# Patient Record
Sex: Female | Born: 1950 | Race: White | Hispanic: No | Marital: Married | Smoking: Former smoker
Health system: Southern US, Community
[De-identification: ages and names within clinical notes are randomized; demographics above are authoritative.]

## PROBLEM LIST (undated history)

## (undated) DIAGNOSIS — T7840XA Allergy, unspecified, initial encounter: Secondary | ICD-10-CM

## (undated) DIAGNOSIS — Z860100 Personal history of colon polyps, unspecified: Secondary | ICD-10-CM

## (undated) DIAGNOSIS — G971 Other reaction to spinal and lumbar puncture: Secondary | ICD-10-CM

## (undated) DIAGNOSIS — Z9889 Other specified postprocedural states: Secondary | ICD-10-CM

## (undated) DIAGNOSIS — I341 Nonrheumatic mitral (valve) prolapse: Secondary | ICD-10-CM

## (undated) DIAGNOSIS — N6019 Diffuse cystic mastopathy of unspecified breast: Secondary | ICD-10-CM

## (undated) DIAGNOSIS — E210 Primary hyperparathyroidism: Secondary | ICD-10-CM

## (undated) DIAGNOSIS — R112 Nausea with vomiting, unspecified: Secondary | ICD-10-CM

## (undated) DIAGNOSIS — Z780 Asymptomatic menopausal state: Secondary | ICD-10-CM

## (undated) HISTORY — PX: COLONOSCOPY: SHX174

## (undated) HISTORY — PX: TONSILLECTOMY AND ADENOIDECTOMY: SUR1326

## (undated) HISTORY — DX: Asymptomatic menopausal state: Z78.0

## (undated) HISTORY — DX: Nonrheumatic mitral (valve) prolapse: I34.1

## (undated) HISTORY — PX: HEMORRHOID SURGERY: SHX153

## (undated) HISTORY — DX: Personal history of colon polyps, unspecified: Z86.0100

## (undated) HISTORY — DX: Diffuse cystic mastopathy of unspecified breast: N60.19

## (undated) HISTORY — PX: CYSTOSTOMY W/ BLADDER BIOPSY: SHX1431

## (undated) HISTORY — PX: OTHER SURGICAL HISTORY: SHX169

## (undated) HISTORY — DX: Allergy, unspecified, initial encounter: T78.40XA

## (undated) HISTORY — DX: Personal history of colonic polyps: Z86.010

---
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
## 1989-05-19 LAB — HM PAP SMEAR: HM Pap smear: NEGATIVE

## 1995-10-11 HISTORY — PX: THUMB FUSION: SUR636

## 1998-04-27 ENCOUNTER — Other Ambulatory Visit: Admission: RE | Admit: 1998-04-27 | Discharge: 1998-04-27 | Payer: Self-pay | Admitting: Gynecology

## 1998-10-16 ENCOUNTER — Ambulatory Visit (HOSPITAL_COMMUNITY): Admission: RE | Admit: 1998-10-16 | Discharge: 1998-10-16 | Payer: Self-pay | Admitting: Urology

## 1999-02-23 ENCOUNTER — Other Ambulatory Visit: Admission: RE | Admit: 1999-02-23 | Discharge: 1999-02-23 | Payer: Self-pay | Admitting: Gynecology

## 1999-06-02 ENCOUNTER — Other Ambulatory Visit: Admission: RE | Admit: 1999-06-02 | Discharge: 1999-06-02 | Payer: Self-pay | Admitting: Gynecology

## 2000-06-22 ENCOUNTER — Ambulatory Visit (HOSPITAL_COMMUNITY): Admission: RE | Admit: 2000-06-22 | Discharge: 2000-06-22 | Payer: Self-pay | Admitting: Orthopedic Surgery

## 2000-06-22 ENCOUNTER — Encounter: Payer: Self-pay | Admitting: Orthopedic Surgery

## 2000-07-11 ENCOUNTER — Other Ambulatory Visit: Admission: RE | Admit: 2000-07-11 | Discharge: 2000-07-11 | Payer: Self-pay | Admitting: Gynecology

## 2001-07-17 ENCOUNTER — Other Ambulatory Visit: Admission: RE | Admit: 2001-07-17 | Discharge: 2001-07-17 | Payer: Self-pay | Admitting: Gynecology

## 2002-09-24 ENCOUNTER — Other Ambulatory Visit: Admission: RE | Admit: 2002-09-24 | Discharge: 2002-09-24 | Payer: Self-pay | Admitting: Gynecology

## 2003-05-28 ENCOUNTER — Ambulatory Visit (HOSPITAL_COMMUNITY): Admission: RE | Admit: 2003-05-28 | Discharge: 2003-05-28 | Payer: Self-pay | Admitting: *Deleted

## 2003-05-28 ENCOUNTER — Encounter (INDEPENDENT_AMBULATORY_CARE_PROVIDER_SITE_OTHER): Payer: Self-pay | Admitting: Specialist

## 2004-01-14 ENCOUNTER — Other Ambulatory Visit: Admission: RE | Admit: 2004-01-14 | Discharge: 2004-01-14 | Payer: Self-pay | Admitting: Gynecology

## 2005-01-24 ENCOUNTER — Other Ambulatory Visit: Admission: RE | Admit: 2005-01-24 | Discharge: 2005-01-24 | Payer: Self-pay | Admitting: Gynecology

## 2005-02-02 ENCOUNTER — Encounter: Admission: RE | Admit: 2005-02-02 | Discharge: 2005-02-02 | Payer: Self-pay | Admitting: Family Medicine

## 2006-03-28 ENCOUNTER — Ambulatory Visit: Payer: Self-pay | Admitting: Family Medicine

## 2006-04-05 ENCOUNTER — Other Ambulatory Visit: Admission: RE | Admit: 2006-04-05 | Discharge: 2006-04-05 | Payer: Self-pay | Admitting: Gynecology

## 2006-07-07 ENCOUNTER — Ambulatory Visit: Payer: Self-pay | Admitting: Family Medicine

## 2006-09-13 ENCOUNTER — Ambulatory Visit: Payer: Self-pay | Admitting: Family Medicine

## 2007-03-21 ENCOUNTER — Ambulatory Visit: Payer: Self-pay | Admitting: Family Medicine

## 2007-11-28 ENCOUNTER — Other Ambulatory Visit: Admission: RE | Admit: 2007-11-28 | Discharge: 2007-11-28 | Payer: Self-pay | Admitting: Gynecology

## 2008-02-28 ENCOUNTER — Ambulatory Visit: Payer: Self-pay | Admitting: Family Medicine

## 2008-07-23 LAB — HM COLONOSCOPY: HM Colonoscopy: NEGATIVE

## 2008-07-28 ENCOUNTER — Ambulatory Visit: Payer: Self-pay | Admitting: Family Medicine

## 2009-01-07 ENCOUNTER — Ambulatory Visit: Payer: Self-pay | Admitting: Family Medicine

## 2009-06-30 ENCOUNTER — Ambulatory Visit: Payer: Self-pay | Admitting: Family Medicine

## 2009-07-07 ENCOUNTER — Ambulatory Visit: Payer: Self-pay | Admitting: Family Medicine

## 2011-02-25 NOTE — Op Note (Signed)
[facility]  Patient:    Shannon [name], Shannon [name]                        MRN: [id number] Proc. Date: 06/22/00 Adm. Date:  [id number] Disc. Date: [id number] Attending:  Dominica Severin                           Operative Report  DATE OF BIRTH:  [birth year]  PREOPERATIVE DIAGNOSES:  Left thumb interphalangeal joint fracture with displacement of the distal phalanx dorsal portion.  POSTOPERATIVE DIAGNOSES:  Left thumb interphalangeal joint fracture with displacement of the distal phalanx dorsal portion.  PROCEDURE:  Open reduction internal fixation left thumb IP joint fracture.  SURGEON:  Dominica Severin, M.D.  ASSISTANT:  Grayland Jack, P.A.-C.  COMPLICATIONS:  None.  ANESTHESIA:  LMA, general.  TOURNIQUET TIME:  An hour.  DRAINS:  None.  INDICATIONS FOR PROCEDURE:  This patient is a 60 year old white female who has previously been seen in our office by Dr. Ollen Gross. I have counseled her in regards to the risks and benefits of surgery including risk of infection, bleeding, anesthesia, damage to normal structures and failure of the surgery to accomplish its goals of relieving symptoms and restoring function. She desires to proceed.  I have discussed with her the inarticulate nature of her fracture, etc. Given the displacement and high demands upon her thumb, we are planning an ORIF. She understands all the risks and benefits, etc.  OPERATIVE FINDINGS:  The patient had displaced interphalangeal joint fracture about the left thumb, attempted closed manipulation was unsuccessful and thus ORIF was performed without difficulty. She was stable to range of motion after two 1.2 mm screws of the titanium variety were placed dorsal to palmar. There were no complications.  DESCRIPTION OF PROCEDURE:  The patient was taken to the operative suite at which time, LMA anesthesia was induced without difficulty. Preoperative antibiotics were given and the patient  then underwent prep and drape of the left upper extremity in the usual sterile fashion consisting of a Betadine scrub followed by Betadine painting and draping. Once this was done, the patient had manipulative reduction performed for possible percutaneous fixation. This was unable to reduce the fracture adequately. Given the time frame duration, I felt she had interpose tissue in the fracture site preventing this. Thus the tourniquet was inflated to 250 mmHg and the patient had an incision somewhat S shaped made over the IP joint. The patient had dissection carried down through the skin with ______. The EPL was identified and the skin was retracted out of harms way. The fracture site was then expose, curetted, irrigated and interpose fibrous tissue removed from the fracture site which was preventing reduction. Once this was done, provisional fixation was held, a K wire was placed for provision reduction, x-rays were checked. This was noted to be in acceptable alignment and the patient then had placement of two 1.2 mm titanium screws from the Liebinger set. These screws had excellent purchase. There were complications with screw insertion. The patient was then fluorod and placed throughout a range of motion. She was noted to be stable. Fracture reduction was adequate and there was no residual step-off. I was happy with the overall fit. The wound was then copiously irrigated, tourniquet deflated, hemostasis obtained, and the wound closed with interrupted 4-0 Prolene suture. The patient had excellent refill. Marcaine block was given at the end of the  case  0.25% without epinephrine. This was done with 10 cc. Following this, a sterile dressing was applied consisting of xeroform gauze and a volar and a dorsal plaster slab with a finger splint. The patient tolerated this well. She was transferred to the recovery room after extubation and was stable in the recovery room. All sponge, needle  and instrument counts were reported as correct. There were no complications. She will be discharged on Keflex, Percocet and Phenergan to be taken as directed. She will notify me should any problems occur. She will have a regular diet and return to see me in 7 days. DD:  06/22/00 TD:  06/24/00 Job: [id number]

## 2011-02-25 NOTE — Op Note (Signed)
   NAME:  Shannon [name], Shannon [name]                           ACCOUNT NO.:  [id number]   MEDICAL RECORD NO.:  [id number]                   PATIENT TYPE:  AMB   LOCATION:  DAY                                  FACILITY:  [facility]   PHYSICIAN:  Vikki Ports, M.D.         DATE OF BIRTH:  [birth year]   DATE OF PROCEDURE:  05/28/2003  DATE OF DISCHARGE:                                 OPERATIVE REPORT   PREOPERATIVE DIAGNOSIS:  Grade 3 internal hemorrhoids.   POSTOPERATIVE DIAGNOSIS:  Grade 3 internal hemorrhoids.   PROCEDURE:  Complex hemorrhoidectomy, PPH.   ANESTHESIA:  General.   SURGEON:  Vikki Ports, M.D.   ASSISTANTSharlet Salina T. Hoxworth, M.D.   DESCRIPTION OF PROCEDURE:  The patient was taken to the operating room,  placed in a supine position.  After adequate anesthesia was induced, the  patient was placed in the prone jack-knife position.  After perianal/rectal  prep was undertaken, the submucosal tissue was injected with 0.25% Marcaine  with epinephrine and Wydase.  The internal and external sphincters were then  injected using 0.25% Marcaine.  After this was accomplished, anal dilatation  was performed digitally to three fingers.  Surg-I-Loop was then injected  into the rectum.  Anoscope was then inserted, and a 2-0 Prolene pursestring  suture was placed submucosally in a circumferential fashion approximately 4  cm proximal to the dentate line.  After this was accomplished, the Ethicon  PPH stapler was placed, and anvil was placed proximal to the pursestring.  Stapler was then closed tightly to 0.75 mm stapler depth, and digital  inspection of the vagina showed no involvement of the vaginal wall.  After  45 seconds, stapler was then fired.  It was removed, and donut was inspected  showing no muscular tissue; a full, intact donut ring.  The staple line was  then visually inspected.  There was no visible bleeding.  Gelfoam packing  was placed.  Anoderm was injected with  the 0.25% Marcaine.  Dressing was  applied.  Patient tolerated the procedure well and went to PACU in good  condition.                                               Vikki Ports, M.D.    KRH/MEDQ  D:  05/28/2003  T:  05/29/2003  Job:  [id number]

## 2011-03-18 ENCOUNTER — Telehealth: Payer: Self-pay | Admitting: Family Medicine

## 2011-03-18 NOTE — Telephone Encounter (Signed)
PT CALLED AND MADE APP. FOR Tuesday WITH DR. Susann Givens.  CM, LPN

## 2011-03-18 NOTE — Telephone Encounter (Signed)
Last visit here was 06/2009.  Needs appt.

## 2011-03-21 NOTE — Telephone Encounter (Signed)
HANDLED

## 2011-03-22 ENCOUNTER — Encounter: Payer: Self-pay | Admitting: Family Medicine

## 2011-03-22 ENCOUNTER — Ambulatory Visit (INDEPENDENT_AMBULATORY_CARE_PROVIDER_SITE_OTHER): Payer: BC Managed Care – PPO | Admitting: Family Medicine

## 2011-03-22 DIAGNOSIS — J45909 Unspecified asthma, uncomplicated: Secondary | ICD-10-CM | POA: Insufficient documentation

## 2011-03-22 DIAGNOSIS — F410 Panic disorder [episodic paroxysmal anxiety] without agoraphobia: Secondary | ICD-10-CM

## 2011-03-22 MED ORDER — ALBUTEROL 90 MCG/ACT IN AERS: 2.0000 | INHALATION_SPRAY | Freq: Four times a day (QID) | RESPIRATORY_TRACT | Status: DC | PRN

## 2011-03-22 MED ORDER — LORAZEPAM 0.5 MG PO TABS: 0.5000 mg | ORAL_TABLET | Freq: Two times a day (BID) | ORAL | Status: AC | PRN

## 2011-03-22 NOTE — Progress Notes (Signed)
  Subjective:    Patient ID: Shannon [name], female    DOB: Jul 12, 1951, 60 y.o.   MRN: [id number]  HPI She is here for consultation concerning asthma. She has a previous history of exercise-induced asthma and now mainly has trouble when she is exposed to particular irritants. She uses albuterol very rarely. She also has cut back on her Xanax use and rarely uses it. Her last prescription was October 2010. She did find however that several days after taking the Xanax she became quite irritable.   Review of Systems     Objective:   Physical Examalert and in no distress. Tympanic membranes and canals are normal. Throat is clear. Tonsils are normal. Neck is supple without adenopathy or thyromegaly. Cardiac exam shows a regular sinus rhythm without murmurs or gallops. Lungs are clear to auscultation.         Assessment & Plan:  Asthma. Panic attacks Renew albuterol and I will switch her to Ativan and see how that works

## 2011-03-22 NOTE — Patient Instructions (Signed)
Use the albuterol as needed. Also let me know how the Ativan works in with you have any side effects

## 2011-04-19 ENCOUNTER — Encounter: Payer: Self-pay | Admitting: Family Medicine

## 2011-10-06 ENCOUNTER — Encounter: Payer: Self-pay | Admitting: Family Medicine

## 2011-10-06 ENCOUNTER — Ambulatory Visit (INDEPENDENT_AMBULATORY_CARE_PROVIDER_SITE_OTHER): Payer: BC Managed Care – PPO | Admitting: Family Medicine

## 2011-10-06 VITALS — BP 112/70 | HR 98 | Wt 135.0 lb

## 2011-10-06 DIAGNOSIS — Z139 Encounter for screening, unspecified: Secondary | ICD-10-CM

## 2011-10-06 DIAGNOSIS — H6190 Disorder of external ear, unspecified, unspecified ear: Secondary | ICD-10-CM

## 2011-10-06 DIAGNOSIS — Z23 Encounter for immunization: Secondary | ICD-10-CM

## 2011-10-06 DIAGNOSIS — R768 Other specified abnormal immunological findings in serum: Secondary | ICD-10-CM

## 2011-10-06 DIAGNOSIS — R894 Abnormal immunological findings in specimens from other organs, systems and tissues: Secondary | ICD-10-CM

## 2011-10-06 NOTE — Progress Notes (Signed)
  Subjective:    Patient ID: Shannon [name], female    DOB: [birth year], 60 y.o.   MRN: [id number]  HPI She is here for consult concerning recent blood work done at [facility] which did show hepatitis C. She has a remote history of blood transfusion in 1971. She has never been involved with street drugs. She is quite distraught over this. She also would like a right earlobe reevaluated. The area is still gets scaly and slightly itchy.   Review of Systems     Objective:   Physical Exam Alert and in no distress. The right ear lobe on the superior part does show some granulation type tissue.       Assessment & Plan:   1. Screening for unspecified condition  Hepatitis B Surface AntiBODY, Hepatitis A Ab, Total  2. Hepatitis C antibody test positive  Hepatitis C Antibody, Hepatitis C RNA quantitative, Hepatitis C genotype, CBC with Differential, Comprehensive metabolic panel, Hepatitis B Surface AntiBODY, Hepatitis A Ab, Total  3. Lesion of earlobe  Ambulatory referral to ENT   I had a long discussion with her concerning hepatitis C and potential damage to her liver as well as transmission to her son. She will have him come in for blood work. I will do routine hepatitis C screening and inform her when I get the results

## 2011-10-06 NOTE — Patient Instructions (Signed)
-  I will call you with the results

## 2011-10-07 LAB — CBC WITH DIFFERENTIAL/PLATELET
Basophils Absolute: 0.1 10*3/uL (ref 0.0–0.1)
Basophils Relative: 2 % — ABNORMAL HIGH (ref 0–1)
Eosinophils Relative: 2 % (ref 0–5)
HCT: 43.9 % (ref 36.0–46.0)
Hemoglobin: 13.7 g/dL (ref 12.0–15.0)
MCHC: 31.2 g/dL (ref 30.0–36.0)
MCV: 101.2 fL — ABNORMAL HIGH (ref 78.0–100.0)
Monocytes Absolute: 0.4 10*3/uL (ref 0.1–1.0)
Monocytes Relative: 9 % (ref 3–12)
RDW: 14.1 % (ref 11.5–15.5)

## 2011-10-07 LAB — COMPREHENSIVE METABOLIC PANEL
AST: 23 U/L (ref 0–37)
Albumin: 5.1 g/dL (ref 3.5–5.2)
BUN: 16 mg/dL (ref 6–23)
Calcium: 11.9 mg/dL — ABNORMAL HIGH (ref 8.4–10.5)
Chloride: 105 mEq/L (ref 96–112)
Creat: 0.69 mg/dL (ref 0.50–1.10)
Glucose, Bld: 116 mg/dL — ABNORMAL HIGH (ref 70–99)
Potassium: 5.4 mEq/L — ABNORMAL HIGH (ref 3.5–5.3)

## 2011-10-07 LAB — HEPATITIS C ANTIBODY: HCV Ab: REACTIVE — AB

## 2012-05-17 LAB — HM DEXA SCAN

## 2012-05-17 LAB — HM PAP SMEAR: HM Pap smear: NEGATIVE

## 2012-05-29 ENCOUNTER — Encounter: Payer: Self-pay | Admitting: Internal Medicine

## 2012-06-04 ENCOUNTER — Encounter: Payer: Self-pay | Admitting: Family Medicine

## 2012-06-04 ENCOUNTER — Ambulatory Visit (INDEPENDENT_AMBULATORY_CARE_PROVIDER_SITE_OTHER): Payer: BC Managed Care – PPO | Admitting: Family Medicine

## 2012-06-04 VITALS — BP 110/70 | HR 80 | Ht 64.5 in | Wt 126.0 lb

## 2012-06-04 DIAGNOSIS — M810 Age-related osteoporosis without current pathological fracture: Secondary | ICD-10-CM

## 2012-06-04 DIAGNOSIS — Z2911 Encounter for prophylactic immunotherapy for respiratory syncytial virus (RSV): Secondary | ICD-10-CM

## 2012-06-04 DIAGNOSIS — Z8249 Family history of ischemic heart disease and other diseases of the circulatory system: Secondary | ICD-10-CM

## 2012-06-04 DIAGNOSIS — F410 Panic disorder [episodic paroxysmal anxiety] without agoraphobia: Secondary | ICD-10-CM

## 2012-06-04 DIAGNOSIS — M722 Plantar fascial fibromatosis: Secondary | ICD-10-CM

## 2012-06-04 DIAGNOSIS — Z0000 Encounter for general adult medical examination without abnormal findings: Secondary | ICD-10-CM

## 2012-06-04 LAB — COMPREHENSIVE METABOLIC PANEL
AST: 18 U/L (ref 0–37)
Albumin: 4.4 g/dL (ref 3.5–5.2)
Alkaline Phosphatase: 86 U/L (ref 39–117)
BUN: 8 mg/dL (ref 6–23)
Calcium: 11.2 mg/dL — ABNORMAL HIGH (ref 8.4–10.5)
Chloride: 104 mEq/L (ref 96–112)
Potassium: 4.6 mEq/L (ref 3.5–5.3)
Sodium: 137 mEq/L (ref 135–145)
Total Protein: 6.8 g/dL (ref 6.0–8.3)

## 2012-06-04 LAB — LIPID PANEL
HDL: 65 mg/dL (ref >39)
LDL Cholesterol: 120 mg/dL — ABNORMAL HIGH (ref 0–99)

## 2012-06-04 LAB — CBC WITH DIFFERENTIAL/PLATELET
Basophils Absolute: 0.1 10*3/uL (ref 0.0–0.1)
HCT: 39.3 % (ref 36.0–46.0)
Hemoglobin: 13.3 g/dL (ref 12.0–15.0)
Lymphocytes Relative: 47 % — ABNORMAL HIGH (ref 12–46)
Monocytes Absolute: 0.3 10*3/uL (ref 0.1–1.0)
Monocytes Relative: 6 % (ref 3–12)
Neutro Abs: 2 10*3/uL (ref 1.7–7.7)
Neutrophils Relative %: 43 % (ref 43–77)
RDW: 13.3 % (ref 11.5–15.5)
WBC: 4.6 10*3/uL (ref 4.0–10.5)

## 2012-06-04 MED ORDER — ALENDRONATE SODIUM 70 MG PO TABS: 70.0000 mg | ORAL_TABLET | ORAL | Status: DC

## 2012-06-04 NOTE — Progress Notes (Signed)
Subjective:    Patient ID: Shannon [name], female    DOB: 03-13-1951, 61 y.o.   MRN: [id number]  HPI She is here for complete examination. She is up-to-date on her Pap and mammogram. Colonoscopy was in 2009. She had a recent DEXA scan which did show osteoporosis. She also has been having left heel pain for the last 6 months. She has been using heel cups and states that she is much better. She does continue on her multivitamin. She does have a history of panic attacks especially driving. She has a remote history of asthma. In June she was seen in an urgent care Center and treated for otitis media with Amoxil. Apparently did help her symptoms. She mainly complains of right parieto-occipital discomfort and ear fullness. Family history significant for several family members that died suddenly and the presumed diagnosis was heart related. Review of Systems Negative except as above    Objective:   Physical Exam BP 110/70  Pulse 80  Ht 5' 4.5" (1.638 m)  Wt 126 lb (57.153 kg)  BMI 21.29 kg/m2  SpO2 97%  General Appearance:    Alert, cooperative, no distress, appears stated age  Head:    Normocephalic, without obvious abnormality, atraumatic  Eyes:    PERRL, conjunctiva/corneas clear, EOM's intact, fundi    benign  Ears:    Normal TM's and external ear canals  Nose:   Nares normal, mucosa normal, no drainage or sinus   tenderness  Throat:   Lips, mucosa, and tongue normal; teeth and gums normal  Neck:   Supple, no lymphadenopathy;  thyroid:  no   enlargement/tenderness/nodules; no carotid   bruit or JVD  Back:    Spine nontender, no curvature, ROM normal, no CVA     tenderness  Lungs:     Clear to auscultation bilaterally without wheezes, rales or     ronchi; respirations unlabored  Chest Wall:    No tenderness or deformity   Heart:    Regular rate and rhythm, S1 and S2 normal, no murmur, rub   or gallop  Breast Exam:    Deferred to GYN  Abdomen:     Soft, non-tender, nondistended,  normoactive bowel sounds,    no masses, no hepatosplenomegaly  Genitalia:    Deferred to GYN     Extremities:   No clubbing, cyanosis or edema, tender to palpation over the left calcaneal spur. Achilles tendon normal. Normal foot and ankle motion.   Pulses:   2+ and symmetric all extremities  Skin:   Skin color, texture, turgor normal, no rashes or lesions  Lymph nodes:   Cervical, supraclavicular, and axillary nodes normal  Neurologic:   CNII-XII intact, normal strength, sensation and gait; reflexes 2+ and symmetric throughout          Psych:   Normal mood, affect, hygiene and grooming.           Assessment & Plan:   1. Osteoporosis  CBC with Differential, Comprehensive metabolic panel, Vitamin D 25 hydroxy, alendronate (FOSAMAX) 70 MG tablet  2. Plantar fasciitis of left foot    3. Family history of heart disease in female family member before age 70  CBC with Differential, Comprehensive metabolic panel, Lipid panel  4. Panic attacks    5. Routine general medical examination at a health care facility  Varicella-zoster vaccine subcutaneous, Lipid panel   instructions given on proper treatment of the plantar fasciitis with stretching and heel cups. Also discussed other modalities. She will continue  to treat her panic attacks on an as-needed basis. Discussed treatment with Fosamax including possible side effects and risk versus benefit.

## 2012-06-05 ENCOUNTER — Encounter: Payer: Self-pay | Admitting: Family Medicine

## 2012-06-05 LAB — VITAMIN D 25 HYDROXY (VIT D DEFICIENCY, FRACTURES): Vit D, 25-Hydroxy: 31 ng/mL (ref 30–89)

## 2012-06-14 ENCOUNTER — Telehealth: Payer: Self-pay | Admitting: Family Medicine

## 2012-06-14 NOTE — Telephone Encounter (Signed)
Pt called and stated that she is afraid to take fosamax. She hears such bad things that she has not had it filled and is not planning on having it filled. She states that she would rather be on estrogen. Please call pt. Pt uses [facility] at [place]

## 2013-01-16 ENCOUNTER — Ambulatory Visit (INDEPENDENT_AMBULATORY_CARE_PROVIDER_SITE_OTHER): Payer: BC Managed Care – PPO | Admitting: Medical

## 2013-01-16 ENCOUNTER — Encounter: Payer: Self-pay | Admitting: Medical

## 2013-01-16 VITALS — BP 130/80 | HR 76 | Temp 98.3°F | Resp 16

## 2013-01-16 DIAGNOSIS — IMO0002 Reserved for concepts with insufficient information to code with codable children: Secondary | ICD-10-CM

## 2013-01-16 DIAGNOSIS — S8000XA Contusion of unspecified knee, initial encounter: Secondary | ICD-10-CM

## 2013-01-16 DIAGNOSIS — S8002XA Contusion of left knee, initial encounter: Secondary | ICD-10-CM

## 2013-01-16 DIAGNOSIS — S8392XA Sprain of unspecified site of left knee, initial encounter: Secondary | ICD-10-CM

## 2013-01-16 MED ORDER — TRAMADOL HCL 50 MG PO TABS: 50.0000 mg | ORAL_TABLET | Freq: Three times a day (TID) | ORAL | Status: DC | PRN

## 2013-01-16 NOTE — Patient Instructions (Signed)
RICE  Rest Ice Compression with knee sleeve or straight leg brace Elevation of leg   Stay off the leg and rest for 5-7 days.  Use Aleve once to twice daily OTC  Tylenol or ultram for pain  If not much improved in 1-2 wk, then recheck.    Recheck 12-14 days.

## 2013-01-16 NOTE — Progress Notes (Signed)
Subjective:  Shannon [name] is a 62 y.o. female who presents for left knee pain.  Here with husband who brought her here.   Was out playing with her 70lb lab dog last evening when he ran towards her and accidentally ran into the front and lateral side of her left knee.  The impact knocked her down on her right side.  Her only c/o is left knee pain, unable to bear weight due to the pain.  No head injury, LOC.  No weakness, numbness, tingling, just pain.  Trying not to put weight on it.  Last night used Tylenol as she doesn't tolerate higher doses of NSAIDs well due to GI upset, used ice.   No other aggravating or relieving factors.    No other c/o.  The following portions of the patient's history were reviewed and updated as appropriate: allergies, current medications, past family history, past medical history, past social history, past surgical history and problem list.  ROS Otherwise as in subjective above  Objective: Physical Exam  Vital signs reviewed  General appearance: alert, no distress, WD/WN MSK: tender with left knee anteriorly and superiorly and lateral just above anterolateral joint line mainly in a flexed position of the knee, otherwise nontender to palpation, pain with weight bearing and lifting her leg extended at the knee.  No obvious swelling, deformity, or laxity of the knee, negative McMurray, otherwise bilat LE unremarkable Lower extremities neurovascularly intact Ext: no obvious swelling of left knee or leg   Assessment: Encounter Diagnoses  Name Primary?  . Knee contusion, left, initial encounter Yes  . Knee sprain and strain, left, initial encounter     Plan: Advised that joint stability is fine, no obvious swelling, seems low risk for fracture or ligamentous tear.   advised of the diagnosis of left knee contusion and sprain, discussed treatment plan, including initial rest, ice, gentle ROM, can use crutches and long knee brace (scripts given), and recheck 1-2 wk,  sooner prn.  Patient Instructions  RICE  Rest Ice Compression with knee sleeve or straight leg brace Elevation of leg   Stay off the leg and rest for 5-7 days.  Use Aleve once to twice daily OTC  Tylenol or ultram for pain  If not much improved in 1-2 wk, then recheck.    Recheck 12-14 days.

## 2013-01-23 ENCOUNTER — Ambulatory Visit: Payer: BC Managed Care – PPO | Admitting: Family Medicine

## 2014-09-26 ENCOUNTER — Ambulatory Visit (INDEPENDENT_AMBULATORY_CARE_PROVIDER_SITE_OTHER): Payer: BC Managed Care – PPO | Admitting: Family Medicine

## 2014-09-26 VITALS — BP 140/90 | HR 82 | Wt 122.0 lb

## 2014-09-26 DIAGNOSIS — M79675 Pain in left toe(s): Secondary | ICD-10-CM

## 2014-09-26 NOTE — Progress Notes (Signed)
   Subjective:    Patient ID: Shannon [name], female    DOB: 1950-11-18, 63 y.o.   MRN: [id number]  HPI She woke up this morning and while walking kicked the door jam causing lateral deviation of the left fifth toe. She was able to put it back into place.   Review of Systems     Objective:   Physical Exam swollen ecchymotic distal left fifth toe at the MTP joint. Pain on palpation in that area. Normal toe alignment X-ray shows no evidence of fracture     Assessment & Plan:  Pain of toe of left foot  she will use ice, hard soled shoes and NSAID of choice. I explained that she probably did soft tissue ligamentous damage however since she has good alignment and its the fifth toe, no intervention is really needed other than supportive care mentioned  above

## 2014-09-26 NOTE — Patient Instructions (Signed)
icefor 20 minutes 3 times per day wear hard soled shoes Advil or Aleve for pain

## 2015-01-26 ENCOUNTER — Telehealth: Payer: Self-pay | Admitting: Internal Medicine

## 2015-01-26 ENCOUNTER — Other Ambulatory Visit: Payer: Self-pay | Admitting: Medical

## 2015-01-26 ENCOUNTER — Encounter: Payer: Self-pay | Admitting: Medical

## 2015-01-26 ENCOUNTER — Ambulatory Visit (INDEPENDENT_AMBULATORY_CARE_PROVIDER_SITE_OTHER): Payer: BLUE CROSS/BLUE SHIELD | Admitting: Medical

## 2015-01-26 VITALS — BP 120/80 | HR 68 | Resp 15 | Wt 136.0 lb

## 2015-01-26 DIAGNOSIS — M79602 Pain in left arm: Secondary | ICD-10-CM

## 2015-01-26 DIAGNOSIS — M5432 Sciatica, left side: Secondary | ICD-10-CM | POA: Diagnosis not present

## 2015-01-26 DIAGNOSIS — M79605 Pain in left leg: Secondary | ICD-10-CM | POA: Diagnosis not present

## 2015-01-26 DIAGNOSIS — M79652 Pain in left thigh: Secondary | ICD-10-CM

## 2015-01-26 MED ORDER — CYCLOBENZAPRINE HCL 10 MG PO TABS: 10.0000 mg | ORAL_TABLET | Freq: Every evening | ORAL | Status: DC | PRN

## 2015-01-26 MED ORDER — ALBUTEROL SULFATE HFA 108 (90 BASE) MCG/ACT IN AERS: 2.0000 | INHALATION_SPRAY | Freq: Four times a day (QID) | RESPIRATORY_TRACT | Status: DC | PRN

## 2015-01-26 MED ORDER — HYDROCODONE-ACETAMINOPHEN 5-325 MG PO TABS: 1.0000 | ORAL_TABLET | Freq: Four times a day (QID) | ORAL | Status: DC | PRN

## 2015-01-26 NOTE — Progress Notes (Signed)
Subjective: Here for c/o back and leg pain.   She notes that she exercises daily, feels strong, tries to keep her back in good order.  But pulled something in her back few months ago pulling asparagus out of her garden.  Since then has had back pain daily.  initially pain would go from back to left leg all the way to the foot.  Still gets this pain down leg.  She notes that she can do stair master or other exercise doesn't seem to have pain but when she stops and is at rest the pain really comes on.  Usually sitting will relieve the pain, but lately having pains in left posterior thigh despite rest.   last few nights pain is waking her up.   Had some tingling in the left leg initially, but not now, has more of a throbbing pain.  Takes tylenol and that did help til last few weeks.   Has used ice,heat, and nothing particular works.  NSAIDs gives her bad indigestion, does note hx/o GI bleed in the past with NSAID.  No other aggravating or relieving factors. No other complaint.  Past Medical History  Diagnosis Date  . Fibrocystic disease of breast   . Allergy     RHINTIS  . Asthma   . Hx of colonic polyps   . Hemorrhoids   . Menopause    Past Surgical History  Procedure Laterality Date  . Tonsillectomy and adenoidectomy    . Appendectomy  1964   Objective: BP 120/80 mmHg  Pulse 68  Resp 15  Wt 136 lb (61.689 kg)   Gen: wd, wn, nad, lean white female Back: mild tenderness left sciatic notch, otherwise nontender, no obvious scoliosis, normal ROM, great back flexibility MSK: tender left buttock area, area of slight puffiness/localized swelling left leg popliteal area, but no palpable mass, tender left thigh medially, no obvious deformity otherwise, right hip decreased internal ROM, but otherwise legs with normal ROM, nontender otherwise, no deformity, no swelling Ext: no edema Normal LE pulses Neuro: Gets pain in low back with left sided heel walk.  Normal heel and toe walk otherwise, -SLR,  normal leg sensation, DTRs and strength   Assessment: Encounter Diagnoses  Name Primary?  . Sciatica of left side Yes  . Left thigh pain   . Leg pain, superior, left   . Back pain with left-sided sciatica     Plan: Etiology seems sciatica related vs other lumbar spine disease, but can't rule out other causes.   Offered L spine xray but she wants to try conservative approach.  Avoids NSAIDs due to dyspepsia and prior GI bleed.   She will use flexeril QHS prn, hydrocodone prn or OTC tylenol prn.  C/t stretching daily.  C/t elliptical if desired for exercise.  No stair master or heavy lifting or prolonged continuous work in the garden.  Take more frequent breaks.   If no improvement in a week, will consider baseline L spine xray.

## 2015-01-26 NOTE — Telephone Encounter (Signed)
Pt at check out wants to know if you will refill her albuterol as she forgot to ask you when she was in the back with you. Send to [facility] in [place]

## 2015-02-02 ENCOUNTER — Encounter: Payer: Self-pay | Admitting: Medical

## 2015-02-02 ENCOUNTER — Telehealth: Payer: Self-pay | Admitting: Medical

## 2015-02-02 ENCOUNTER — Ambulatory Visit (INDEPENDENT_AMBULATORY_CARE_PROVIDER_SITE_OTHER): Payer: BLUE CROSS/BLUE SHIELD | Admitting: Medical

## 2015-02-02 VITALS — BP 102/70 | HR 75 | Resp 15 | Wt 133.0 lb

## 2015-02-02 DIAGNOSIS — S76312D Strain of muscle, fascia and tendon of the posterior muscle group at thigh level, left thigh, subsequent encounter: Secondary | ICD-10-CM | POA: Diagnosis not present

## 2015-02-02 DIAGNOSIS — M791 Myalgia: Secondary | ICD-10-CM

## 2015-02-02 DIAGNOSIS — M7918 Myalgia, other site: Secondary | ICD-10-CM

## 2015-02-02 NOTE — Progress Notes (Signed)
Subjective: Here for recheck on back and left leg pain, swelling behind left knee.   Since last visit had been using relative rest, no lifting, flexeril which helped sleep and gave some relief, and used the hydrocodone a few times which gives some rest.  Still basically has same symptoms nad the swelling behind left knee is a little puffier with new fatty lump posterolateral to left knee.     At last visit she had been having pain after pulling her back a few months ago pulling asparagus out of her garden.  Since then has had back pain daily.  initially pain would go from back to left leg all the way to the foot.  Still gets this pain down leg, but mainly left thigh.   She notes that she can do stair master or other exercise doesn't seem to have pain but when she stops and is at rest the pain really comes on.  Usually sitting will relieve the pain, but lately having pains in left posterior thigh despite rest.   last few nights pain is waking her up.   Had some tingling in the left leg initially, but not now, has more of a throbbing pain.  Takes tylenol and that did help til last few weeks.   Has used ice,heat, and nothing particular works.  NSAIDs gives her bad indigestion, does note hx/o GI bleed in the past with NSAID.  No other aggravating or relieving factors. No other complaint.  Past Medical History  Diagnosis Date  . Fibrocystic disease of breast   . Allergy     RHINTIS  . Asthma   . Hx of colonic polyps   . Hemorrhoids   . Menopause    Past Surgical History  Procedure Laterality Date  . Tonsillectomy and adenoidectomy    . Appendectomy  1964   Objective: BP 102/70 mmHg  Pulse 75  Resp 15  Wt 133 lb (60.328 kg)   Gen: wd, wn, nad, lean white female Back: nontender, no obvious scoliosis, normal ROM, great back flexibility MSK: tender specifically over left ischial tuberosity, otherwise, no deformity, area of slight puffiness/localized swelling left leg popliteal area, but no  palpable mass, tender left thigh medially, no obvious deformity otherwise, right hip decreased internal ROM, but otherwise legs with normal ROM, nonten swelling Ext: no edema Normal LE pulses Neuro: Gets pain in low back with left sided heel walk.  Normal heel and toe walk otherwise, -SLR, normal leg sensation, DTRs and strength   Assessment: Encounter Diagnoses  Name Primary?  . Pain in left buttock Yes  . Hamstring strain, left, subsequent encounter     Plan: Dr. Redmond School also examined patient today.  Will refer to Dr. Thedore Mins Coluccio/sports medicine for additional treatment of hamstring strain and pain at left ischial tuberosity.

## 2015-02-02 NOTE — Telephone Encounter (Signed)
Per Dr. Redmond School, refer her to Dr. Charlann Boxer with [facility] at [place], primary care but he does sports medicine .  This is for pain of left ischial tuberosity, biceps femoris strain, possible steroid injection.   Pain x 2+ months.

## 2015-02-03 ENCOUNTER — Telehealth: Payer: Self-pay | Admitting: Family Medicine

## 2015-02-03 NOTE — Telephone Encounter (Signed)
Ok.  One other option too is that we sometimes refer to physical therapy and the therapist can use their modalities too help resolve the pain too.

## 2015-02-03 NOTE — Telephone Encounter (Signed)
  Ok. One other option too is that we sometimes refer to physical therapy and the therapist can use their modalities too help resolve the pain too.

## 2015-02-03 NOTE — Telephone Encounter (Signed)
Patient was informed of the PT and she states that she wants to take a few weeks and then she will see how she is feeling. She said she actually felt better this morning.   FYI- patient declined PT at this time

## 2015-02-03 NOTE — Telephone Encounter (Signed)
    Pt called and states to cancel her Ultra sound and injection. She wants to wait and give it a couple of weeks.

## 2015-02-03 NOTE — Telephone Encounter (Signed)
Pt called and states to cancel her Ultra sound and injection.  She wants to wait and give it a couple of weeks.

## 2015-02-04 ENCOUNTER — Telehealth: Payer: Self-pay | Admitting: Family Medicine

## 2015-02-04 NOTE — Telephone Encounter (Signed)
Pt called and states she can no longer stand the pain.  Please go ahead and schedule the shot and the ultra sound.  Pt ph [phone] you can leave message on voice mail.

## 2015-02-04 NOTE — Telephone Encounter (Signed)
Refer as initially planned ASAP to the orthopedist mentioned in prior message

## 2015-02-06 NOTE — Telephone Encounter (Signed)
I spoke with the patient and I gave her all of her appointment information. Dr. Gardenia Phlegm [address] [place] [phone] Feb 19, 2015 @ 1100 am

## 2015-02-19 ENCOUNTER — Other Ambulatory Visit (INDEPENDENT_AMBULATORY_CARE_PROVIDER_SITE_OTHER): Payer: BLUE CROSS/BLUE SHIELD

## 2015-02-19 ENCOUNTER — Encounter: Payer: Self-pay | Admitting: Family Medicine

## 2015-02-19 ENCOUNTER — Ambulatory Visit (INDEPENDENT_AMBULATORY_CARE_PROVIDER_SITE_OTHER): Payer: BLUE CROSS/BLUE SHIELD | Admitting: Family Medicine

## 2015-02-19 VITALS — BP 130/82 | HR 65 | Ht 64.5 in | Wt 134.0 lb

## 2015-02-19 DIAGNOSIS — M533 Sacrococcygeal disorders, not elsewhere classified: Secondary | ICD-10-CM | POA: Diagnosis not present

## 2015-02-19 DIAGNOSIS — G5702 Lesion of sciatic nerve, left lower limb: Secondary | ICD-10-CM

## 2015-02-19 DIAGNOSIS — M79605 Pain in left leg: Secondary | ICD-10-CM

## 2015-02-19 NOTE — Patient Instructions (Addendum)
Good to see you Ice 20 minutes 2 times daily. Usually after activity and before bed. Tennis ball in back left pocket Exercises 3 times a week.  Hip abductor strengthening Lets do Physical therapy as well Vitamin D 2000 IU daily Turmeric 500mg  twice daily Fish oil 2 grams daily See me again in 3-4 weeks.

## 2015-02-19 NOTE — Assessment & Plan Note (Signed)
Patient is having more of a piriformis syndrome that is also causing a sacroiliac joint dysfunction. I do believe that the pain going down the posterior aspect of her leg is secondary to more of a sciatica. Patient has a negative straight leg test at this time but does have a positive Corky Sox. Patient elected try conservative therapy and patient will do home exercises, icing protocol, and did work with Product/process development scientist today. Patient and will come back and see me again in 3-4 weeks. Patient is also been referred to formal physical therapy. Patient continues to have some discomfort we'll consider osteopathic manipulation for the sacroiliac joint first potential injection in the SI joint itself.

## 2015-02-19 NOTE — Progress Notes (Signed)
[name] [address] [place], [place] [address] Phone: [phone] Subjective:    I'm seeing this patient by the request  of:  Wyatt Haste, MD   CC: Left hamstring tenderness  [id number] [name] is a 64 y.o. female coming in with complaint of left hamstring tenderness. Patient states that she had this happen after she was working in her yard while she was also doing calisthenics. Patient felt discomfort more in the low back. Patient states it is more of a soreness that was there and seem to radiate towards her buttocks. Patient then started having more of a tightness of the posterior aspect of her leg. Patient was seen by primary care provider as well as physician assistant. Patient was diagnosed with more of a little hamstring strain. Patient was given some exercises and avoid anti-inflammatories due to her secondary of GI bleed. Patient was not making any significant improvement. Patient states that it seems to get better with activity and then she rests for greater than 10 minutes and then when she tries to stand up again she has severe amount of pain. Nothing that his stopping her through any of her daily activities. States that she can push through if necessary. Rates the severity of pain when it occurs as well as 8 out of 10. Denies any weakness and denies any constant numbness but sometimes has a burning sensation on the posterior aspect the leg.     Past Medical History  Diagnosis Date  . Fibrocystic disease of breast   . Allergy     RHINTIS  . Asthma   . Hx of colonic polyps   . Hemorrhoids   . Menopause    Past Surgical History  Procedure Laterality Date  . Tonsillectomy and adenoidectomy    . Appendectomy  1964   History  Substance Use Topics  . Smoking status: Former Smoker -- 0.25 packs/day    Quit date: 06/04/1985  . Smokeless tobacco: Never Used  . Alcohol Use: 4.8 oz/week    8 Glasses of wine per week   No  Known Allergies Family History  Problem Relation Age of Onset  . Cancer Father 49    Lung cancer  . Heart disease Father   . Hyperlipidemia Father   . Stroke Father   . Heart disease Mother 63    Sudden death presumed MI  . Heart disease Brother 73    Presumed CHF     Past medical history, social, surgical and family history all reviewed in electronic medical record.   Review of Systems: No headache, visual changes, nausea, vomiting, diarrhea, constipation, dizziness, abdominal pain, skin rash, fevers, chills, night sweats, weight loss, swollen lymph nodes, body aches, joint swelling, muscle aches, chest pain, shortness of breath, mood changes.   Objective Blood pressure 130/82, pulse 65, height 5' 4.5" (1.638 m), weight 134 lb (60.782 kg), SpO2 93 %.  General: No apparent distress alert and oriented x3 mood and affect normal, dressed appropriately.  HEENT: Pupils equal, extraocular movements intact  Respiratory: Patient's speak in full sentences and does not appear short of breath  Cardiovascular: No lower extremity edema, non tender, no erythema  Skin: Warm dry intact with no signs of infection or rash on extremities or on axial skeleton.  Abdomen: Soft nontender  Neuro: Cranial nerves II through XII are intact, neurovascularly intact in all extremities with 2+ DTRs and 2+ pulses.  Lymph: No lymphadenopathy of posterior or anterior cervical chain or  axillae bilaterally.  Gait normal with good balance and coordination.  MSK:  Non tender with full range of motion and good stability and symmetric strength and tone of shoulders, elbows, wrist, hip, and ankles bilaterally.  Knee: Left Normal to inspection with no erythema or effusion or obvious bony abnormalities. Palpation normal with no warmth, joint line tenderness, patellar tenderness, or condyle tenderness. ROM full in flexion and extension and lower leg rotation. Ligaments with solid consistent endpoints including ACL, PCL, LCL,  MCL. Negative Mcmurray's, Apley's, and Thessalonian tests. Non painful patellar compression. Patellar glide without crepitus. Patellar and quadriceps tendons unremarkable. Hamstring and quadriceps strength is normal.  Patient does have a positive Faber with pain over the piriformis to palpation as well as over the left SI joint. Contralateral knee unremarkable  MSK US performed of: Left hip This study was ordered, performed, and interpreted by Charlann Boxer D.O.  Hip: Trochanteric bursa without swelling or effusion. Acetabular labrum visualized and without tears, displacement, or effusion in joint. Femoral neck appears unremarkable without increased power doppler signal along Cortex. Over patient's piriformis though patient does have hypoechoic changes going towards the origin at the lateral aspect of the sacrum. No definitive tear noted but hypoechoic changes with increasing Doppler flow. Hamstring intact with no ischial bursitis noted.  IMPRESSION: Increasing hypoechoic changes surrounding left perform [place] muscle.   Procedure note 01751; 15 minutes spent for Therapeutic exercises as stated in above notes.  This included exercises focusing on stretching, strengthening, with significant focus on eccentric aspects.  Piriformis Syndrome  The patient was given a handout from Dr. Arne Cleveland book "The Sports Medicine Patient Advisor" describing the anatomy and rehabilitation of the following condition: Piriformis Syndrome  Also given a handout with more extensive Piriformis stretching, hip flexor and abductor strengthening, ham stretching  Rec deep massage, explained self-massage with ball  Proper technique shown and discussed handout in great detail with ATC.  All questions were discussed and answered.     Impression and Recommendations:     This case required medical decision making of moderate complexity.

## 2015-02-19 NOTE — Progress Notes (Signed)
Pre visit review using our clinic review tool, if applicable. No additional management support is needed unless otherwise documented below in the visit note. 

## 2015-02-19 NOTE — Assessment & Plan Note (Signed)

## 2015-03-12 ENCOUNTER — Ambulatory Visit (INDEPENDENT_AMBULATORY_CARE_PROVIDER_SITE_OTHER): Payer: BLUE CROSS/BLUE SHIELD | Admitting: Family Medicine

## 2015-03-12 ENCOUNTER — Other Ambulatory Visit (INDEPENDENT_AMBULATORY_CARE_PROVIDER_SITE_OTHER): Payer: BLUE CROSS/BLUE SHIELD

## 2015-03-12 ENCOUNTER — Encounter: Payer: Self-pay | Admitting: Family Medicine

## 2015-03-12 ENCOUNTER — Ambulatory Visit (INDEPENDENT_AMBULATORY_CARE_PROVIDER_SITE_OTHER)
Admission: RE | Admit: 2015-03-12 | Discharge: 2015-03-12 | Disposition: A | Discharge status: Home / Self Care | Payer: BLUE CROSS/BLUE SHIELD | Source: Ambulatory Visit | Attending: Family Medicine | Admitting: Family Medicine

## 2015-03-12 VITALS — BP 124/82 | HR 80 | Ht 64.5 in | Wt 135.0 lb

## 2015-03-12 DIAGNOSIS — G5702 Lesion of sciatic nerve, left lower limb: Secondary | ICD-10-CM

## 2015-03-12 MED ORDER — GABAPENTIN 100 MG PO CAPS: 100.0000 mg | ORAL_CAPSULE | Freq: Every day | ORAL | Status: DC

## 2015-03-12 NOTE — Assessment & Plan Note (Signed)
Attempted an injection today with near complete resolution of pain. X-rays ordered today to rule out any lumbar radiculopathy that would be within the differential. Sacroiliac joint dysfunction is also still a concern. Patient will continue with the home exercises and we discussed icing protocol. We discussed the possibility of putting patient and more stability issues a could be helpful. Patient will start gabapentin at night to see if this will help with some of the radicular symptoms. Patient and will come back and see me again in 2-3 weeks for further evaluation and treatment.  Spent  25 minutes with patient face-to-face and had greater than 50% of counseling including as described above in assessment and plan.

## 2015-03-12 NOTE — Patient Instructions (Addendum)
Good to see you Ice 20 minutes 2 times daily. Usually after activity and before bed. Exercises 3 times a week.  Gabapentin 100mg  nightly.  We injected the piriformis as well today Lets get xray of your back as well.  Continue the exercises but only 3 times a week.   See me again in 3 weeks.

## 2015-03-12 NOTE — Progress Notes (Signed)
Pre visit review using our clinic review tool, if applicable. No additional management support is needed unless otherwise documented below in the visit note. 

## 2015-03-12 NOTE — Progress Notes (Signed)
[name], [name] Phone: [phone] Subjective:    I'm seeing this patient by the request  of:  Wyatt Haste, MD   CC: Left hamstring tenderness follow up  [id number] [name] is a 64 y.o. female coming in with complaint of left hamstring tenderness. Patient was seen previously and did have more of a piriformis syndrome. Patient's ultrasound as well as physical exam was consistent with this. Patient elected try formal physical therapy, home exercises, icing protocol and manual massage. Patient states she has not made any significant improvement at this time. Patient states that this still seems to be very debilitating and the mornings. Denies any weakness but states that sometimes her leg hurts him much that she does not do certain daily activities. Patient Blose to walk and has noticed that she's been doing this less and less frequently. Patient does have muscle relaxers and is using them more frequently which patient does not like. Patient was unable to tolerate some of the over-the-counter natural supplementations.     Past Medical History  Diagnosis Date  . Fibrocystic disease of breast   . Allergy     RHINTIS  . Asthma   . Hx of colonic polyps   . Hemorrhoids   . Menopause    Past Surgical History  Procedure Laterality Date  . Tonsillectomy and adenoidectomy    . Appendectomy  1964   History  Substance Use Topics  . Smoking status: Former Smoker -- 0.25 packs/day    Quit date: 06/04/1985  . Smokeless tobacco: Never Used  . Alcohol Use: 4.8 oz/week    8 Glasses of wine per week   No Known Allergies Family History  Problem Relation Age of Onset  . Cancer Father 47    Lung cancer  . Heart disease Father   . Hyperlipidemia Father   . Stroke Father   . Heart disease Mother 34    Sudden death presumed MI  . Heart disease Brother 74    Presumed CHF     Past medical history, social,  surgical and family history all reviewed in electronic medical record.   Review of Systems: No headache, visual changes, nausea, vomiting, diarrhea, constipation, dizziness, abdominal pain, skin rash, fevers, chills, night sweats, weight loss, swollen lymph nodes, body aches, joint swelling, muscle aches, chest pain, shortness of breath, mood changes.   Objective Blood pressure 124/82, pulse 80, height 5' 4.5" (1.638 m), weight 135 lb (61.236 kg), SpO2 98 %.  General: No apparent distress alert and oriented x3 mood and affect normal, dressed appropriately.  HEENT: Pupils equal, extraocular movements intact  Respiratory: Patient's speak in full sentences and does not appear short of breath  Cardiovascular: No lower extremity edema, non tender, no erythema  Skin: Warm dry intact with no signs of infection or rash on extremities or on axial skeleton.  Abdomen: Soft nontender  Neuro: Cranial nerves II through XII are intact, neurovascularly intact in all extremities with 2+ DTRs and 2+ pulses.  Lymph: No lymphadenopathy of posterior or anterior cervical chain or axillae bilaterally.  Gait normal with good balance and coordination.  MSK:  Non tender with full range of motion and good stability and symmetric strength and tone of shoulders, elbows, wrist, hip, and ankles bilaterally.  Knee: Left Normal to inspection with no erythema or effusion or obvious bony abnormalities. Palpation normal with no warmth, joint line tenderness, patellar tenderness, or condyle tenderness. ROM full in  flexion and extension and lower leg rotation. Ligaments with solid consistent endpoints including ACL, PCL, LCL, MCL. Negative Mcmurray's, Apley's, and Thessalonian tests. Non painful patellar compression. Patellar glide without crepitus. Patellar and quadriceps tendons unremarkable. Hamstring and quadriceps strength is normal.  Patient does have a positive Faber with pain over the piriformis to palpation as well as  over the left SI joint. Contralateral knee unremarkable  Procedure: Real-time Ultrasound Guided Injection of left piriformis muscle Device: GE Logiq E  Ultrasound guided injection is preferred based studies that show increased duration, increased effect, greater accuracy, decreased procedural pain, increased response rate, and decreased cost with ultrasound guided versus blind injection.  Verbal informed consent obtained.  Time-out conducted.  Noted no overlying erythema, induration, or other signs of local infection.  Skin prepped in a sterile fashion.  Local anesthesia: Topical Ethyl chloride.  With sterile technique and under real time ultrasound guidance:  With a 22-gauge 3 inch needle patient was injected with 2 mL of 0.5% Marcaine and 1 mL of Kenalog 40 mg/dL. Completed without difficulty  Pain immediately resolved suggesting accurate placement of the medication.  Advised to call if fevers/chills, erythema, induration, drainage, or persistent bleeding.  Images permanently stored and available for review in the ultrasound unit.  Impression: Technically successful ultrasound guided injection.      Impression and Recommendations:     This case required medical decision making of moderate complexity.

## 2015-03-13 ENCOUNTER — Telehealth: Payer: Self-pay | Admitting: Family Medicine

## 2015-03-13 NOTE — Telephone Encounter (Signed)
Pt called in said that her pain is still at about a 7 and has a few questions about the injec she had gotten and her meds.  She is requesting a call back at home number

## 2015-03-16 NOTE — Telephone Encounter (Signed)
Spoke to pt, she states she is doing much better and the injection has helped tremendously.

## 2015-04-02 ENCOUNTER — Telehealth: Payer: Self-pay | Admitting: Family Medicine

## 2015-04-02 ENCOUNTER — Ambulatory Visit (INDEPENDENT_AMBULATORY_CARE_PROVIDER_SITE_OTHER): Payer: BLUE CROSS/BLUE SHIELD | Admitting: Family Medicine

## 2015-04-02 ENCOUNTER — Encounter: Payer: Self-pay | Admitting: Family Medicine

## 2015-04-02 VITALS — BP 126/78 | HR 75 | Ht 64.5 in | Wt 129.0 lb

## 2015-04-02 DIAGNOSIS — G5702 Lesion of sciatic nerve, left lower limb: Secondary | ICD-10-CM

## 2015-04-02 MED ORDER — ALBUTEROL SULFATE 108 (90 BASE) MCG/ACT IN AEPB: 2.0000 | INHALATION_SPRAY | Freq: Four times a day (QID) | RESPIRATORY_TRACT | Status: DC | PRN

## 2015-04-02 NOTE — Patient Instructions (Signed)
You are doing great!! I think you are going to continue to improve.  ICe is your friend Continue the exercises Alternate the stepper and the treadmill daily Work on hip abductors Call me in 2 weeks and tell me how you are doing, if pain is worsening we can consider the PRP.

## 2015-04-02 NOTE — Telephone Encounter (Signed)
Requesting refill on Albuterol Inhaler

## 2015-04-02 NOTE — Progress Notes (Signed)
[facility] [address], [place] [address] Phone: [phone] Subjective:    I'm seeing this patient by the request  of:  Wyatt Haste, MD   CC: Left hamstring tenderness follow up  [id number] ELEXA Shannon [name] is a 64 y.o. female coming in with complaint of left hamstring tenderness. Patient was seen previously and did have more of a piriformis syndrome. Patient continued to have pain and patient was given a sterile injection into the piriformis muscle itself. Patient states that she is approximately 85-90% better. Patient last week states that she even had days where she had no pain at all. States though that intermittently she can still have some of the radiation down the leg. Patient did not take the gabapentin due to those listed side effects on the bottle. Patient states that this pain is not stopping her from any activities and she is walking on an average of 14-18,000 steps a day.     Past Medical History  Diagnosis Date  . Fibrocystic disease of breast   . Allergy     RHINTIS  . Asthma   . Hx of colonic polyps   . Hemorrhoids   . Menopause    Past Surgical History  Procedure Laterality Date  . Tonsillectomy and adenoidectomy    . Appendectomy  1964   History  Substance Use Topics  . Smoking status: Former Smoker -- 0.25 packs/day    Quit date: 06/04/1985  . Smokeless tobacco: Never Used  . Alcohol Use: 4.8 oz/week    8 Glasses of wine per week   No Known Allergies Family History  Problem Relation Age of Onset  . Cancer Father 55    Lung cancer  . Heart disease Father   . Hyperlipidemia Father   . Stroke Father   . Heart disease Mother 31    Sudden death presumed MI  . Heart disease Brother 59    Presumed CHF     Past medical history, social, surgical and family history all reviewed in electronic medical record.   Review of Systems: No headache, visual changes, nausea, vomiting, diarrhea, constipation,  dizziness, abdominal pain, skin rash, fevers, chills, night sweats, weight loss, swollen lymph nodes, body aches, joint swelling, muscle aches, chest pain, shortness of breath, mood changes.   Objective Blood pressure 126/78, pulse 75, height 5' 4.5" (1.638 m), weight 129 lb (58.514 kg), SpO2 98 %.  General: No apparent distress alert and oriented x3 mood and affect normal, dressed appropriately.  HEENT: Pupils equal, extraocular movements intact  Respiratory: Patient's speak in full sentences and does not appear short of breath  Cardiovascular: No lower extremity edema, non tender, no erythema  Skin: Warm dry intact with no signs of infection or rash on extremities or on axial skeleton.  Abdomen: Soft nontender  Neuro: Cranial nerves II through XII are intact, neurovascularly intact in all extremities with 2+ DTRs and 2+ pulses.  Lymph: No lymphadenopathy of posterior or anterior cervical chain or axillae bilaterally.  Gait normal with good balance and coordination.  MSK:  Non tender with full range of motion and good stability and symmetric strength and tone of shoulders, elbows, wrist, hip, and ankles bilaterally.  Knee: Left Normal to inspection with no erythema or effusion or obvious bony abnormalities. Palpation normal with no warmth, joint line tenderness, patellar tenderness, or condyle tenderness. ROM full in flexion and extension and lower leg rotation. Ligaments with solid consistent endpoints including ACL, PCL, LCL, MCL.  Negative Mcmurray's, Apley's, and Thessalonian tests. Non painful patellar compression. Patellar glide without crepitus. Patellar and quadriceps tendons unremarkable. Hamstring and quadriceps strength is normal.  Patient does have a positive Faber with pain over the piriformis to palpation as well as over the left SI joint significant less than previous exam. Full range of motion of hip       Impression and Recommendations:     This case required medical  decision making of moderate complexity.

## 2015-04-02 NOTE — Assessment & Plan Note (Signed)
Patient is doing remarkably well at this time. We discussed the possibility of PRP injection to the pain does return. Patient given a flyer and will consider this. His lungs patient continues to do well though I would consider continuing with the conservative therapy. We discussed phase II exercises and how to alternate between walking as well as stepper at home. We discussed muscle relaxer when needed and patient will try the gabapentin and see if this makes any difference. Differential also includes the sacroiliac joint and we may need to consider injection if patient does not make any significant improvement otherwise. I think that this is low likelihood though. Patient will follow-up with me again in 6 weeks for further evaluation.  Spent  25 minutes with patient face-to-face and had greater than 50% of counseling including as described above in assessment and plan.

## 2015-04-02 NOTE — Progress Notes (Signed)
Pre visit review using our clinic review tool, if applicable. No additional management support is needed unless otherwise documented below in the visit note. 

## 2015-04-17 ENCOUNTER — Telehealth: Payer: Self-pay | Admitting: Family Medicine

## 2015-04-17 NOTE — Telephone Encounter (Signed)
Patient wanted to let you know a few things: She is doing the hip abductors. She started the stair exercise but started too strongly which she thinks may have set her back. She is still using the treadmill and is planning on starting the stairs again. She has now been on gabapentin for 6 days and she doesn't feel any difference. Her pain is about 6-7. Would you like her to keep taking this? She wants to do the PRP treatment and is wondering what limitations she will have after treatment. She said she would do it today if she could but she is leaving for the beach. I did make an appointment for her after her beach trip on 7/27. Please call the patient to discuss. Best number for her is [phone]

## 2015-04-17 NOTE — Telephone Encounter (Signed)
Discussed with pt. Scheduled PRP appt.

## 2015-04-21 ENCOUNTER — Other Ambulatory Visit: Payer: Self-pay | Admitting: Gynecology

## 2015-04-22 LAB — CYTOLOGY - PAP

## 2015-05-06 ENCOUNTER — Encounter: Payer: Self-pay | Admitting: Family Medicine

## 2015-05-06 ENCOUNTER — Other Ambulatory Visit (INDEPENDENT_AMBULATORY_CARE_PROVIDER_SITE_OTHER): Payer: BLUE CROSS/BLUE SHIELD

## 2015-05-06 ENCOUNTER — Ambulatory Visit (INDEPENDENT_AMBULATORY_CARE_PROVIDER_SITE_OTHER): Payer: Self-pay | Admitting: Family Medicine

## 2015-05-06 VITALS — BP 126/80 | HR 77 | Ht 64.5 in | Wt 133.0 lb

## 2015-05-06 DIAGNOSIS — G5702 Lesion of sciatic nerve, left lower limb: Secondary | ICD-10-CM

## 2015-05-06 NOTE — Progress Notes (Signed)
[facility] [address] [place], [place] [address] Phone: [phone] Subjective:    I'm seeing this patient by the request  of:  Wyatt Haste, MD   CC: Continued piriformis syndrome  [id number] Shannon [name] is a 64 y.o. female coming in with complaint of left hamstring tenderness. Patient was seen previously and did have more of a piriformis syndrome. Patient was doing significantly better and was having some mild worsening pain. Patient like to be 100% go. Patient is here for injection of the piriformis muscle. Patient is trying PRP. Patient is here for injection     Past Medical History  Diagnosis Date  . Fibrocystic disease of breast   . Allergy     RHINTIS  . Asthma   . Hx of colonic polyps   . Hemorrhoids   . Menopause    Past Surgical History  Procedure Laterality Date  . Tonsillectomy and adenoidectomy    . Appendectomy  1964   History  Substance Use Topics  . Smoking status: Former Smoker -- 0.25 packs/day    Quit date: 06/04/1985  . Smokeless tobacco: Never Used  . Alcohol Use: 4.8 oz/week    8 Glasses of wine per week   No Known Allergies Family History  Problem Relation Age of Onset  . Cancer Father 42    Lung cancer  . Heart disease Father   . Hyperlipidemia Father   . Stroke Father   . Heart disease Mother 16    Sudden death presumed MI  . Heart disease Brother 74    Presumed CHF     Past medical history, social, surgical and family history all reviewed in electronic medical record.   Review of Systems: No headache, visual changes, nausea, vomiting, diarrhea, constipation, dizziness, abdominal pain, skin rash, fevers, chills, night sweats, weight loss, swollen lymph nodes, body aches, joint swelling, muscle aches, chest pain, shortness of breath, mood changes.   Objective Blood pressure 126/80, pulse 77, weight 133 lb (60.328 kg), SpO2 97 %.  General: No apparent distress alert and oriented x3 mood  and affect normal, dressed appropriately.  HEENT: Pupils equal, extraocular movements intact  Respiratory: Patient's speak in full sentences and does not appear short of breath  Cardiovascular: No lower extremity edema, non tender, no erythema  Skin: Warm dry intact with no signs of infection or rash on extremities or on axial skeleton.  Abdomen: Soft nontender  Neuro: Cranial nerves II through XII are intact, neurovascularly intact in all extremities with 2+ DTRs and 2+ pulses.  Lymph: No lymphadenopathy of posterior or anterior cervical chain or axillae bilaterally.  Gait normal with good balance and coordination.  MSK:  Non tender with full range of motion and good stability and symmetric strength and tone of shoulders, elbows, wrist, hip, and ankles bilaterally.  Knee: Left Normal to inspection with no erythema or effusion or obvious bony abnormalities. Palpation normal with no warmth, joint line tenderness, patellar tenderness, or condyle tenderness. ROM full in flexion and extension and lower leg rotation. Ligaments with solid consistent endpoints including ACL, PCL, LCL, MCL. Negative Mcmurray's, Apley's, and Thessalonian tests. Non painful patellar compression. Patellar glide without crepitus. Patellar and quadriceps tendons unremarkable. Hamstring and quadriceps strength is normal.  Patient does have a positive Faber with pain over the piriformis to palpation as well as over the left SI joint significant less than previous exam. Full range of motion of hip No change from previous exam  Procedure: Real-time  Ultrasound Guided Injection of left piriformis muscle Device: GE Logiq E  Ultrasound guided injection is preferred based studies that show increased duration, increased effect, greater accuracy, decreased procedural pain, increased response rate, and decreased cost with ultrasound guided versus blind injection.  Verbal informed consent obtained.  Time-out conducted.  Noted no  overlying erythema, induration, or other signs of local infection.  Skin prepped in a sterile fashion.  Local anesthesia: Topical Ethyl chloride.  With sterile technique and under real time ultrasound guidance: With a 21-gauge 3 inch needle patient was injected with 0.5 mL of 0.5% Marcaine and 4 mL of PRP was injected under ultrasound guidance.  Completed without difficulty  Pain immediately resolved suggesting accurate placement of the medication.  Advised to call if fevers/chills, erythema, induration, drainage, or persistent bleeding.  Images permanently stored and available for review in the ultrasound unit.  Impression: Technically successful ultrasound guided injection.     Impression and Recommendations:     This case required medical decision making of moderate complexity.

## 2015-05-06 NOTE — Assessment & Plan Note (Signed)
Patient was given an injection today. Patient was slowly increase activity over the course the next 3 weeks. Patient was given an exercise prescription. Patient will come back and see me again in 3 weeks for further evaluation

## 2015-05-06 NOTE — Patient Instructions (Signed)
Good to see  you We did injection today. In the next 24 hours try not to take ibuprofen or ice the area.  Lets take it easy through the weekend.  On Monday ok to start 25% of what you normally do exercise in duration.  Start our exercises 3 times a week as well.  After 2 weeks increase to 50% and continue the same frequency See me again in 3 weeks. At that time if allright then we will advance to 75% and then fully by week 4.

## 2015-05-27 ENCOUNTER — Ambulatory Visit (INDEPENDENT_AMBULATORY_CARE_PROVIDER_SITE_OTHER): Payer: BLUE CROSS/BLUE SHIELD | Admitting: Family Medicine

## 2015-05-27 ENCOUNTER — Encounter: Payer: Self-pay | Admitting: Family Medicine

## 2015-05-27 ENCOUNTER — Ambulatory Visit: Payer: BLUE CROSS/BLUE SHIELD | Admitting: Family Medicine

## 2015-05-27 VITALS — BP 128/80 | HR 61 | Ht 64.5 in | Wt 136.0 lb

## 2015-05-27 DIAGNOSIS — G5702 Lesion of sciatic nerve, left lower limb: Secondary | ICD-10-CM | POA: Diagnosis not present

## 2015-05-27 NOTE — Assessment & Plan Note (Signed)
Patient has unremarkable well at this time. Encourage her to continue to increase her activity as tolerated. We are going to give her an exercise prescription and increase it slowly. We discussed continuing the vitamins. Patient well to step back if any discomfort occurs. Patient and will actually come back and see me again in 6 weeks for further evaluation and treatment.  Spent  25 minutes with patient face-to-face and had greater than 50% of counseling including as described above in assessment and plan.

## 2015-05-27 NOTE — Progress Notes (Signed)
[facility] [address] [place], [place] [address] Phone: [phone] Subjective:     CC: Continued piriformis syndrome follow-up  [id number] Shannon [name] is a 64 y.o. female coming in with complaint of left hamstring tenderness. Patient was seen previously and did have more of a piriformis syndrome. Patient was making some improvement but elected try and PRP injection. This was done 3 weeks ago and patient has slowly started to increase her activity. Patient states she has not had days where she has had not any pain. Patient states that the head never happened previously. Still has some severe sharp pain though with certain activities but significantly less than what it was previously. Patient is increase her activity to 50% of what she was doing higher to the injection. Patient overall is happy with the results.    Past Medical History  Diagnosis Date  . Fibrocystic disease of breast   . Allergy     RHINTIS  . Asthma   . Hx of colonic polyps   . Hemorrhoids   . Menopause    Past Surgical History  Procedure Laterality Date  . Tonsillectomy and adenoidectomy    . Appendectomy  1964   Social History  Substance Use Topics  . Smoking status: Former Smoker -- 0.25 packs/day    Quit date: 06/04/1985  . Smokeless tobacco: Never Used  . Alcohol Use: 4.8 oz/week    8 Glasses of wine per week   No Known Allergies Family History  Problem Relation Age of Onset  . Cancer Father 29    Lung cancer  . Heart disease Father   . Hyperlipidemia Father   . Stroke Father   . Heart disease Mother 62    Sudden death presumed MI  . Heart disease Brother 60    Presumed CHF     Past medical history, social, surgical and family history all reviewed in electronic medical record.   Review of Systems: No headache, visual changes, nausea, vomiting, diarrhea, constipation, dizziness, abdominal pain, skin rash, fevers, chills, night sweats, weight loss, swollen  lymph nodes, body aches, joint swelling, muscle aches, chest pain, shortness of breath, mood changes.   Objective Blood pressure 128/80, pulse 61, height 5' 4.5" (1.638 m), weight 136 lb (61.689 kg), SpO2 98 %.  General: No apparent distress alert and oriented x3 mood and affect normal, dressed appropriately.  HEENT: Pupils equal, extraocular movements intact  Respiratory: Patient's speak in full sentences and does not appear short of breath  Cardiovascular: No lower extremity edema, non tender, no erythema  Skin: Warm dry intact with no signs of infection or rash on extremities or on axial skeleton.  Abdomen: Soft nontender  Neuro: Cranial nerves II through XII are intact, neurovascularly intact in all extremities with 2+ DTRs and 2+ pulses.  Lymph: No lymphadenopathy of posterior or anterior cervical chain or axillae bilaterally.  Gait normal with good balance and coordination.  MSK:  Non tender with full range of motion and good stability and symmetric strength and tone of shoulders, elbows, wrist, hip, and ankles bilaterally.  Knee: Left Normal to inspection with no erythema or effusion or obvious bony abnormalities. Palpation normal with no warmth, joint line tenderness, patellar tenderness, or condyle tenderness. ROM full in flexion and extension and lower leg rotation. Ligaments with solid consistent endpoints including ACL, PCL, LCL, MCL. Negative Mcmurray's, Apley's, and Thessalonian tests. Non painful patellar compression. Patellar glide without crepitus. Patellar and quadriceps tendons unremarkable. Hamstring and  quadriceps strength is normal. Negative Faber test but still minimally tender over the piriformis muscle itself. Improvement from previous exam mild increase in strength of the hip abductors. Negative straight leg test    Impression and Recommendations:     This case required medical decision making of moderate complexity.

## 2015-05-27 NOTE — Progress Notes (Signed)
Pre visit review using our clinic review tool, if applicable. No additional management support is needed unless otherwise documented below in the visit note. 

## 2015-05-27 NOTE — Patient Instructions (Signed)
Good to see you Ice is your friend and would do nightly for another 2 weeks and after your stretches Stretches after working out Still working out 3 times a week for another month Ok to do house work the days off.  75% of working out for 1 week then 100% I expect discomfort but not pain.  You will do great See me agai nin 6 weeks.

## 2015-07-08 ENCOUNTER — Encounter: Payer: Self-pay | Admitting: Family Medicine

## 2015-07-08 ENCOUNTER — Ambulatory Visit (INDEPENDENT_AMBULATORY_CARE_PROVIDER_SITE_OTHER): Payer: BLUE CROSS/BLUE SHIELD | Admitting: Family Medicine

## 2015-07-08 VITALS — BP 118/76 | HR 68 | Wt 133.0 lb

## 2015-07-08 DIAGNOSIS — G5702 Lesion of sciatic nerve, left lower limb: Secondary | ICD-10-CM | POA: Diagnosis not present

## 2015-07-08 NOTE — Progress Notes (Signed)
[facility] [address] [place], [place] [address] Phone: [phone] Subjective:     CC: Continued piriformis syndrome follow-up  [id number] Shannon [name] is a 64 y.o. female coming in with complaint of left hamstring tenderness. Patient was seen previously and did have more of a piriformis syndrome. Patient was making some improvement but elected try and PRP injection. Patient had increase her activity. Patient is having only some very minimal discomfort. Nothing that seems to be giving her any trouble overall. Patient is very happy and is able to do all daily activities and has been walking 30 minutes at least daily with no significant discomfort. Best she has been in years.    Past Medical History  Diagnosis Date  . Fibrocystic disease of breast   . Allergy     RHINTIS  . Asthma   . Hx of colonic polyps   . Hemorrhoids   . Menopause    Past Surgical History  Procedure Laterality Date  . Tonsillectomy and adenoidectomy    . Appendectomy  1964   Social History  Substance Use Topics  . Smoking status: Former Smoker -- 0.25 packs/day    Quit date: 06/04/1985  . Smokeless tobacco: Never Used  . Alcohol Use: 4.8 oz/week    8 Glasses of wine per week   No Known Allergies Family History  Problem Relation Age of Onset  . Cancer Father 76    Lung cancer  . Heart disease Father   . Hyperlipidemia Father   . Stroke Father   . Heart disease Mother 53    Sudden death presumed MI  . Heart disease Brother 61    Presumed CHF     Past medical history, social, surgical and family history all reviewed in electronic medical record.   Review of Systems: No headache, visual changes, nausea, vomiting, diarrhea, constipation, dizziness, abdominal pain, skin rash, fevers, chills, night sweats, weight loss, swollen lymph nodes, body aches, joint swelling, muscle aches, chest pain, shortness of breath, mood changes.   Objective Blood pressure 118/76,  pulse 68, weight 133 lb (60.328 kg).  General: No apparent distress alert and oriented x3 mood and affect normal, dressed appropriately.  HEENT: Pupils equal, extraocular movements intact  Respiratory: Patient's speak in full sentences and does not appear short of breath  Cardiovascular: No lower extremity edema, non tender, no erythema  Skin: Warm dry intact with no signs of infection or rash on extremities or on axial skeleton.  Abdomen: Soft nontender  Neuro: Cranial nerves II through XII are intact, neurovascularly intact in all extremities with 2+ DTRs and 2+ pulses.  Lymph: No lymphadenopathy of posterior or anterior cervical chain or axillae bilaterally.  Gait normal with good balance and coordination.  MSK:  Non tender with full range of motion and good stability and symmetric strength and tone of shoulders, elbows, wrist, hip, and ankles bilaterally.  Knee: Left Normal to inspection with no erythema or effusion or obvious bony abnormalities. Palpation normal with no warmth, joint line tenderness, patellar tenderness, or condyle tenderness. ROM full in flexion and extension and lower leg rotation. Ligaments with solid consistent endpoints including ACL, PCL, LCL, MCL. Negative Mcmurray's, Apley's, and Thessalonian tests. Non painful patellar compression. Patellar glide without crepitus. Patellar and quadriceps tendons unremarkable. Hamstring and quadriceps strength is normal. Negative negative Corky Sox with some mild tightness    Impression and Recommendations:     This case required medical decision making of moderate complexity.

## 2015-07-08 NOTE — Assessment & Plan Note (Signed)
Patient is doing so well status post PRP. At this time patient will follow-up as needed and continue to increase her activity as tolerated.

## 2016-07-29 ENCOUNTER — Encounter: Payer: BLUE CROSS/BLUE SHIELD | Admitting: Family Medicine

## 2016-08-23 ENCOUNTER — Other Ambulatory Visit: Payer: Self-pay | Admitting: Nurse Practitioner

## 2016-08-23 DIAGNOSIS — N644 Mastodynia: Secondary | ICD-10-CM

## 2016-08-24 ENCOUNTER — Encounter: Payer: Self-pay | Admitting: Family Medicine

## 2016-08-24 ENCOUNTER — Ambulatory Visit (INDEPENDENT_AMBULATORY_CARE_PROVIDER_SITE_OTHER): Payer: BLUE CROSS/BLUE SHIELD | Admitting: Family Medicine

## 2016-08-24 ENCOUNTER — Other Ambulatory Visit: Payer: Self-pay | Admitting: Family Medicine

## 2016-08-24 VITALS — BP 114/70 | HR 70 | Temp 99.0°F | Wt 121.0 lb

## 2016-08-24 DIAGNOSIS — M810 Age-related osteoporosis without current pathological fracture: Secondary | ICD-10-CM | POA: Diagnosis not present

## 2016-08-24 DIAGNOSIS — J0100 Acute maxillary sinusitis, unspecified: Secondary | ICD-10-CM | POA: Diagnosis not present

## 2016-08-24 DIAGNOSIS — J4520 Mild intermittent asthma, uncomplicated: Secondary | ICD-10-CM

## 2016-08-24 DIAGNOSIS — E785 Hyperlipidemia, unspecified: Secondary | ICD-10-CM | POA: Diagnosis not present

## 2016-08-24 LAB — LIPID PANEL
Cholesterol: 210 mg/dL — ABNORMAL HIGH (ref <200)
HDL: 75 mg/dL (ref >50)
LDL CALC: 116 mg/dL — AB (ref <100)
Total CHOL/HDL Ratio: 2.8 Ratio (ref <5.0)
Triglycerides: 93 mg/dL (ref <150)
VLDL: 19 mg/dL (ref <30)

## 2016-08-24 LAB — CBC WITH DIFFERENTIAL/PLATELET
BASOS PCT: 1 %
Basophils Absolute: 60 cells/uL (ref 0–200)
Eosinophils Absolute: 60 cells/uL (ref 15–500)
Eosinophils Relative: 1 %
HEMATOCRIT: 42.6 % (ref 35.0–45.0)
Hemoglobin: 14.1 g/dL (ref 11.7–15.5)
LYMPHS ABS: 2220 {cells}/uL (ref 850–3900)
LYMPHS PCT: 37 %
MCH: 31.8 pg (ref 27.0–33.0)
MCHC: 33.1 g/dL (ref 32.0–36.0)
MCV: 96.2 fL (ref 80.0–100.0)
MONO ABS: 360 {cells}/uL (ref 200–950)
MPV: 9.9 fL (ref 7.5–12.5)
Monocytes Relative: 6 %
NEUTROS ABS: 3300 {cells}/uL (ref 1500–7800)
Neutrophils Relative %: 55 %
Platelets: 299 10*3/uL (ref 140–400)
RBC: 4.43 MIL/uL (ref 3.80–5.10)
RDW: 12.9 % (ref 11.0–15.0)
WBC: 6 10*3/uL (ref 4.0–10.5)

## 2016-08-24 LAB — COMPREHENSIVE METABOLIC PANEL
ALK PHOS: 81 U/L (ref 33–130)
ALT: 14 U/L (ref 6–29)
AST: 18 U/L (ref 10–35)
Albumin: 4.7 g/dL (ref 3.6–5.1)
BILIRUBIN TOTAL: 0.5 mg/dL (ref 0.2–1.2)
BUN: 13 mg/dL (ref 7–25)
CALCIUM: 12.1 mg/dL — AB (ref 8.6–10.4)
CO2: 27 mmol/L (ref 20–31)
Chloride: 104 mmol/L (ref 98–110)
Creat: 0.85 mg/dL (ref 0.50–0.99)
GLUCOSE: 104 mg/dL — AB (ref 65–99)
Potassium: 4.5 mmol/L (ref 3.5–5.3)
Sodium: 139 mmol/L (ref 135–146)
Total Protein: 7.1 g/dL (ref 6.1–8.1)

## 2016-08-24 MED ORDER — ALBUTEROL SULFATE HFA 108 (90 BASE) MCG/ACT IN AERS: 2.0000 | INHALATION_SPRAY | Freq: Four times a day (QID) | RESPIRATORY_TRACT | 1 refills | Status: DC | PRN

## 2016-08-24 MED ORDER — AMOXICILLIN-POT CLAVULANATE 875-125 MG PO TABS: 1.0000 | ORAL_TABLET | Freq: Two times a day (BID) | ORAL | 0 refills | Status: DC

## 2016-08-24 NOTE — Progress Notes (Signed)
   Subjective:    Patient ID: Shannon [name], female    DOB: 06/06/1951, 65 y.o.   MRN: [id number]  HPI She is here for consult concerning multiple issues. She has a four-week history of difficulty with rhinorrhea, sneezing, sore throat, headache and coughing. She has tried OTC meds without success. She also complains of sinus pressure and upper tooth discomfort. She does not smoke. She does have asthma and would like a refill on her rescue inhaler. She also has a previous history of difficulty with osteoporosis but has not gone on any bisphosphonates citing concerns over the medication. Recently she had a hot feeling in her left breast and has concerns over cancer. She has had a recent mammogram is scheduled for another one soon. She also has a history of hyperlipidemia and is made some major dietary changes over the last several months.   Review of Systems     Objective:   Physical Exam Alert and in no distress. Nasal mucosa is normal. Tender over frontal and maxillary sinuses. Tympanic membranes and canals are normal. Pharyngeal area is normal. Neck is supple without adenopathy or thyromegaly. Cardiac exam shows a regular sinus rhythm without murmurs or gallops. Lungs are clear to auscultation. Will also have her gynecologist send over the DEXA report and follow-up after the mammogram.       Assessment & Plan:  Age-related osteoporosis without current pathological fracture - Plan: CBC with Differential/Platelet, Comprehensive metabolic panel, VITAMIN D 25 Hydroxy (Vit-D Deficiency, Fractures)  Acute maxillary sinusitis, recurrence not specified - Plan: amoxicillin-clavulanate (AUGMENTIN) 875-125 MG tablet  Mild intermittent asthma, unspecified whether complicated - Plan: albuterol (PROVENTIL HFA;VENTOLIN HFA) 108 (90 Base) MCG/ACT inhaler  Hyperlipidemia LDL goal <100 - Plan: Lipid panel I will get routine screening on her. She will have the DEXA scan sent to me. Discussed in detail the  need to treat the osteoporosis more aggressively. She will call if no improvement after the use of the Augmentin for her sinus problem.

## 2016-08-25 LAB — VITAMIN D 25 HYDROXY (VIT D DEFICIENCY, FRACTURES): Vit D, 25-Hydroxy: 35 ng/mL (ref 30–100)

## 2016-08-29 ENCOUNTER — Ambulatory Visit
Admission: RE | Admit: 2016-08-29 | Discharge: 2016-08-29 | Disposition: A | Discharge status: Home / Self Care | Payer: BLUE CROSS/BLUE SHIELD | Source: Ambulatory Visit | Attending: Nurse Practitioner | Admitting: Nurse Practitioner

## 2016-08-29 DIAGNOSIS — N644 Mastodynia: Secondary | ICD-10-CM

## 2016-08-30 ENCOUNTER — Telehealth: Payer: Self-pay | Admitting: Family Medicine

## 2016-08-30 ENCOUNTER — Encounter: Payer: Self-pay | Admitting: Internal Medicine

## 2016-08-30 NOTE — Telephone Encounter (Signed)
Pt states she received a letter of recommendation from her [facility] Dr. Elaina Pattee office that she have a TSH & PTH checked and she would like that drawn here and would also like a fasting Lipid because she was not fasting when she had this drawn, she had a big breakfast before she had this drawn.  And wants to know if this is ok?

## 2016-09-05 ENCOUNTER — Telehealth: Payer: Self-pay

## 2016-09-05 LAB — C-TERMINAL TELOPEPTIDE

## 2016-09-05 NOTE — Addendum Note (Signed)
Addended by: [name] on: 09/05/2016 12:18 PM   Modules accepted: Orders

## 2016-09-05 NOTE — Telephone Encounter (Signed)
Pt will drop by one day this week to have lab drawn can you please put in future order

## 2016-09-05 NOTE — Telephone Encounter (Signed)
Dr.Lalonde would like pt to come in to have a lab draw left message for pt on cell to please call me back

## 2016-09-07 ENCOUNTER — Other Ambulatory Visit: Payer: BLUE CROSS/BLUE SHIELD

## 2016-09-07 DIAGNOSIS — E215 Disorder of parathyroid gland, unspecified: Secondary | ICD-10-CM

## 2016-09-08 LAB — PARATHYROID HORMONE, INTACT (NO CA): PTH: 169 pg/mL — ABNORMAL HIGH (ref 14–64)

## 2016-09-13 ENCOUNTER — Telehealth: Payer: Self-pay | Admitting: Family Medicine

## 2016-09-13 ENCOUNTER — Encounter: Payer: BLUE CROSS/BLUE SHIELD | Admitting: Family Medicine

## 2016-09-13 NOTE — Telephone Encounter (Signed)
Pt called and states that she can not get into the [facility] until DEC the 29th and she is wanting to know if she should still take the the vitamin d and the calcium pills or should she stop taking them now, she is afraid it will keep building up the calcium will keep building it, please advise pt, she would like a call at [phone]

## 2016-10-07 ENCOUNTER — Ambulatory Visit (INDEPENDENT_AMBULATORY_CARE_PROVIDER_SITE_OTHER): Payer: Medicare Other | Admitting: Internal Medicine

## 2016-10-07 ENCOUNTER — Encounter: Payer: Self-pay | Admitting: Internal Medicine

## 2016-10-07 DIAGNOSIS — E213 Hyperparathyroidism, unspecified: Secondary | ICD-10-CM | POA: Diagnosis not present

## 2016-10-07 LAB — PHOSPHORUS: PHOSPHORUS: 2.3 mg/dL (ref 2.3–4.6)

## 2016-10-07 LAB — BASIC METABOLIC PANEL WITH GFR
BUN: 11 mg/dL (ref 7–25)
CO2: 22 mmol/L (ref 20–31)
Calcium: 11.8 mg/dL — ABNORMAL HIGH (ref 8.6–10.4)
Chloride: 105 mmol/L (ref 98–110)
Creat: 0.62 mg/dL (ref 0.50–0.99)
GFR, Est Non African American: >89 mL/min (ref >=60)
GLUCOSE: 100 mg/dL — AB (ref 65–99)
POTASSIUM: 4.8 mmol/L (ref 3.5–5.3)
Sodium: 139 mmol/L (ref 135–146)

## 2016-10-07 LAB — MAGNESIUM: MAGNESIUM: 2 mg/dL (ref 1.5–2.5)

## 2016-10-07 NOTE — Progress Notes (Signed)
Patient ID: Shannon [name], female   DOB: Mar 28, 1951, 65 y.o.   MRN: [id number]    HPI  Shannon [name] is a 65 y.o.-year-old female, referred by her PCP, Dr. Redmond School, for evaluation for hypercalcemia/hyperparathyroidism.  Pt was dx with hypercalcemia at least in 2012. I reviewed pt's pertinent labs: Lab Results  Component Value Date   PTH 169 (H) 09/07/2016   CALCIUM 12.1 (H) 08/24/2016   CALCIUM 11.2 (H) 06/04/2012   CALCIUM 11.9 (H) 10/06/2011   Patient also has osteoporosis. I reviewed pt's DEXA scans available: Date L1-L4 T score FN T score  05/17/2012  -3.1  RFN -2.7 LFN -3.0   She had a new DEXA scan 07/2016 >> worse >> she will bring me the scan for review. She refuses antiresorptive meds.  She has a history of left thumb fracture in 2001 while playing tennis. She had several falls playing sports or missing a step >> no fxs.  No h/o kidney stones.  No h/o CKD. Last BUN/Cr: Lab Results  Component Value Date   BUN 13 08/24/2016   BUN 8 06/04/2012   CREATININE 0.85 08/24/2016   CREATININE 0.67 06/04/2012   Pt is not on HCTZ.  No h/o vitamin D deficiency. Reviewed vit D levels: Lab Results  Component Value Date   VD25OH 35 08/24/2016   VD25OH 31 06/04/2012   She takes vitamin D 2000 units daily; she also eats green, leafy, vegetables. No dairy as son is allergic.   Pt was on calcium 1200 mg daily for years >> stopped recently. She had weakness and pain in legs, HAs, and mm cramps >> better after stopping calcium.  Drinks 64 oz water daily. Drinks 4 cups coffee a day.   She walks daily - 10,000 steps.  Pt does not have a FH of hypercalcemia, pituitary tumors, thyroid cancer.   Significant FH of early death 2/2 AMIs in parents and brothers.  ROS: Constitutional: no weight gain/loss, + occas. fatigue, no subjective hyperthermia/hypothermia, + nocturia  Eyes: no blurry vision, no xerophthalmia ENT: no sore throat, no nodules palpated in throat, + dysphagia/no  odynophagia, no hoarseness Cardiovascular: no CP/SOB/+ occas. Palpitations/no leg swelling Respiratory: no cough/SOB Gastrointestinal: + occasional N/V/D/C Musculoskeletal: + muscle aches and cramps/+ joint aches Skin: no rashes Neurological: + tremors/no numbness/tingling/dizziness, + R sided HAs Psychiatric: no depression/+ anxiety  Past Medical History:  Diagnosis Date  . Allergy    RHINTIS  . Asthma   . Fibrocystic disease of breast   . Hemorrhoids   . Hx of colonic polyps   . Menopause   . Mitral valve prolapse    Past Surgical History:  Procedure Laterality Date  . APPENDECTOMY  1964  . CYSTOSTOMY W/ BLADDER BIOPSY     Dr. Rosana Hoes  . HEMORRHOID SURGERY     Dr. Truitt Leep  . THUMB FUSION  1997   Dr Maxie Better  . TONSILLECTOMY AND ADENOIDECTOMY     Social History   Social History  . Marital status: Married    Spouse name: N/A  . Number of children: 2   Occupational History  . retired    Social History Main Topics  . Smoking status: Former Smoker    Packs/day: 0.25    Years: 10.00    Types: Cigarettes    Quit date: 06/04/1985  . Smokeless tobacco: Never Used  . Alcohol use 3.0 oz/week    5 Glasses of wine per week  . Drug use: No   Current Outpatient Prescriptions on  File Prior to Visit  Medication Sig Dispense Refill  . albuterol (PROVENTIL HFA;VENTOLIN HFA) 108 (90 Base) MCG/ACT inhaler Inhale 2 puffs into the lungs every 6 (six) hours as needed for wheezing or shortness of breath. 1 Inhaler 1  . cholecalciferol (VITAMIN D) 1000 UNITS tablet Take 1,000 Units by mouth daily.    . Coenzyme Q10 (CO Q 10) 10 MG CAPS Take by mouth.    . Multiple Vitamins-Iron (CHLORELLA PO) Take by mouth.    . vitamin B-12 (CYANOCOBALAMIN) 100 MCG tablet Take 100 mcg by mouth daily.     No current facility-administered medications on file prior to visit.    Allergies  Allergen Reactions  . Asa [Aspirin] Nausea Only    Minor bleeding for long periods of ASA taking    Family  History  Problem Relation Age of Onset  . Cancer Father 85    Lung cancer  . Heart disease Father   . Hyperlipidemia Father   . Heart disease Mother 33    Sudden death presumed MI  . Heart disease Brother 72    Presumed CHF  . Heart disease Brother    PE: BP 118/68   Pulse 76   Temp 98.1 F (36.7 C) (Oral)   Resp 16   Ht 5' 4.5" (1.638 m)   Wt 119 lb 2 oz (54 kg)   BMI 20.13 kg/m  Wt Readings from Last 3 Encounters:  10/07/16 119 lb 2 oz (54 kg)  08/24/16 121 lb (54.9 kg)  07/08/15 133 lb (60.3 kg)   Constitutional: overweight, in NAD. No kyphosis. Eyes: PERRLA, EOMI, no exophthalmos ENT: moist mucous membranes, no thyromegaly - lumpy-bumpy thyroid, no cervical lymphadenopathy Cardiovascular: RRR, No MRG Respiratory: CTA B Gastrointestinal: abdomen soft, NT, ND, BS+ Musculoskeletal: no deformities, strength intact in all 4 Skin: moist, warm, no rashes Neurological: no tremor with outstretched hands, DTR normal in all 4  Assessment: 1. Hypercalcemia/hyperparathyroidism  Plan: Patient has had several instances of elevated calcium, with the highest level being at 12.1. A corresponding intact PTH level was also high, at 169.  - Patient does not have a h/o vitamin D deficiency, last level was 35. - No h/o nephrolithiasis, no abdominal pain, depression, but has OP, bone and mm pain and cramps - I discussed with the patient about the physiology of calcium and parathyroid hormone, and possible side effects from increased PTH, including kidney stones, osteoporosis, abdominal pain, etc.  - We discussed that hyperparathyroidism could be primary (Familial hypercalcemic hypocalciuria or parathyroid adenoma) or secondary (to conditions like: vitamin D deficiency, calcium malabsorption, hypercalciuria, renal insufficiency, etc.). She does not have vitamin D deficiency or renal insufficiency, but we need to check for the other entities above. - I discussed that PTH could be elevated in  the setting of a low vitamin D, however, as her vitamin D level is normal, we will further need to investigate her for primary or secondary hyperparathyroidism: calcium level intact PTH ([facility]) Magnesium Phosphorus vitamin D-1,25 HO 24h urinary calcium/creatinine ratio - given instructions for urine collection - We discussed possible consequences of hyperparathyroidism: ~1/3 pts will develop complications over 15 years (OP, nephrolithiasis).  - If the tests indicate a parathyroid adenoma, she agrees with a referral to surgery.  - she meets criteria for parathyroid surgery:  Increased calcium by more than 1 mg/dL above the upper limit of normal  Kidney ds.  Osteoporosis (or Vb fx) Age <60 years old Newer (2013): High UCa >400 mg/d and increased  stone risk by biochemical stone risk analysis Presence of nephrolithiasis or nephrocalcinosis Pt's preference - I will see the patient back in 4 months  Component     Latest Ref Rng & Units 10/07/2016  Sodium     135 - 146 mmol/L 139  Potassium     3.5 - 5.3 mmol/L 4.8  Chloride     98 - 110 mmol/L 105  CO2     20 - 31 mmol/L 22  Glucose     65 - 99 mg/dL 100 (H)  BUN     7 - 25 mg/dL 11  Creatinine     0.50 - 0.99 mg/dL 0.62  Calcium     8.6 - 10.4 mg/dL 11.8 (H)  GFR, Est African American     >=60 mL/min >89  GFR, Est Non African American     >=60 mL/min >89  Vitamin D 1, 25 (OH) Total     18 - 72 pg/mL 117 (H)  Vitamin D3 1, 25 (OH)     pg/mL 117  Vitamin D2 1, 25 (OH)     pg/mL <8  PTH     15 - 65 pg/mL 127 (H)  Magnesium     1.5 - 2.5 mg/dL 2.0  Phosphorus     2.3 - 4.6 mg/dL 2.3   Elevated calcium, PTH and 1, 25 dihydroxy vitamin D. Phosphorus is at the lower limit of normal. Magnesium is normal. All labs point towards primary hyperparathyroidism. Will await the urine collection to quantify her 24-hour urine calcium and will probably need to refer her to surgery afterwards.  Urinary calcium is also  high: Component     Latest Ref Rng & Units 10/11/2016  Calcium, Ur     Not estab mg/dL 17  Calcium, 24 hour urine     35 - 250 mg/24 h 476 (H)  Creatinine, Urine     20 - 320 mg/dL 44  Creatinine, 24H Ur     0.63 - 2.50 g/24 h 1.23   Addendum 10/12/16 Reviewed latest bone density report per records brought by patient. The DEXA scan was done at [facility]: Date L1-L4 T score FN T score  05/27/2016 -3.6 RFN -2.9 LFN -3.2  The scores are indeed worse.  Labs above confirm primary hyperparathyroidism and she qualifies for surgery. Will put the referral in to see Dr. Harlow Asa, per discussion with patient at the time of the visit.  Philemon Kingdom, MD PhD [facility]

## 2016-10-07 NOTE — Patient Instructions (Addendum)
Please stop at the lab.  Please continue vitamin D 2000 units daily.  Do not stay away from dietary calcium.  Patient information (Up-to-Date): Collection of a 24-hour urine specimen  - You should collect every drop of urine during each 24-hour period. It does not matter how much or little urine is passed each time, as long as every drop is collected. - Begin the urine collection in the morning after you wake up, after you have emptied your bladder for the first time. - Urinate (empty the bladder) for the first time and flush it down the toilet. Note the exact time (eg, 6:15 AM). You will begin the urine collection at this time. - Collect every drop of urine during the day and night in an empty collection bottle. Store the bottle at room temperature or in the refrigerator. - If you need to have a bowel movement, any urine passed with the bowel movement should be collected. Try not to include feces with the urine collection. If feces does get mixed in, do not try to remove the feces from the urine collection bottle. - Finish by collecting the first urine passed the next morning, adding it to the collection bottle. This should be within ten minutes before or after the time of the first morning void on the first day (which was flushed). In this example, you would try to void between 6:05 and 6:25 on the second day. - If you need to urinate one hour before the final collection time, drink a full glass of water so that you can void again at the appropriate time. If you have to urinate 20 minutes before, try to hold the urine until the proper time. - Please note the exact time of the final collection, even if it is not the same time as when collection began on day 1. - The bottle(s) may be kept at room temperature for a day or two, but should be kept cool or refrigerated for longer periods of time.  Please come back for a follow-up appointment in 4 months.

## 2016-10-08 LAB — PARATHYROID HORMONE, INTACT (NO CA): PTH: 127 pg/mL — AB (ref 15–65)

## 2016-10-09 LAB — VITAMIN D 1,25 DIHYDROXY
Vitamin D 1, 25 (OH)2 Total: 117 pg/mL — ABNORMAL HIGH (ref 18–72)
Vitamin D2 1, 25 (OH)2: <8 pg/mL
Vitamin D3 1, 25 (OH)2: 117 pg/mL

## 2016-10-11 DIAGNOSIS — E213 Hyperparathyroidism, unspecified: Secondary | ICD-10-CM | POA: Diagnosis not present

## 2016-10-12 LAB — CALCIUM, URINE, 24 HOUR
CALCIUM 24HR UR: 476 mg/(24.h) — AB (ref 35–250)
Calcium, Ur: 17 mg/dL

## 2016-10-12 LAB — CREATININE, URINE, 24 HOUR
CREATININE 24H UR: 1.23 g/(24.h) (ref 0.63–2.50)
CREATININE, URINE: 44 mg/dL (ref 20–320)

## 2016-10-17 ENCOUNTER — Telehealth: Payer: Self-pay

## 2016-10-17 ENCOUNTER — Telehealth: Payer: Self-pay | Admitting: Internal Medicine

## 2016-10-17 NOTE — Telephone Encounter (Signed)
Called and notified patient that the order was put in Friday, they probably had not received the order yet. Notified patient to let us know if she had no heard from them in a week.

## 2016-10-17 NOTE — Telephone Encounter (Signed)
Patient stated she haven't heard anything concerning her referral about her Parathyroid surgery Dr Harlow Asa.  Please advise

## 2016-11-08 ENCOUNTER — Telehealth: Payer: Self-pay

## 2016-11-08 NOTE — Telephone Encounter (Signed)
Patient asked if all her information was sent over to [facility] surgery, she has an appt tomorrow. Please advise

## 2016-11-08 NOTE — Telephone Encounter (Signed)
Called and notified patient that all office visit information should have been sent over. I notified them to contact us if any questions.

## 2016-11-09 DIAGNOSIS — E210 Primary hyperparathyroidism: Secondary | ICD-10-CM | POA: Diagnosis not present

## 2016-11-10 ENCOUNTER — Other Ambulatory Visit (HOSPITAL_COMMUNITY): Payer: Self-pay | Admitting: Surgery

## 2016-11-10 DIAGNOSIS — E210 Primary hyperparathyroidism: Secondary | ICD-10-CM

## 2016-11-22 ENCOUNTER — Encounter (HOSPITAL_COMMUNITY)
Admission: RE | Admit: 2016-11-22 | Discharge: 2016-11-22 | Disposition: A | Discharge status: Home / Self Care | Payer: Medicare Other | Source: Ambulatory Visit | Attending: Surgery | Admitting: Surgery

## 2016-11-22 DIAGNOSIS — E0590 Thyrotoxicosis, unspecified without thyrotoxic crisis or storm: Secondary | ICD-10-CM | POA: Diagnosis not present

## 2016-11-22 DIAGNOSIS — E210 Primary hyperparathyroidism: Secondary | ICD-10-CM

## 2016-11-22 MED ORDER — TECHNETIUM TC 99M SESTAMIBI GENERIC - CARDIOLITE
25.0000 | Freq: Once | INTRAVENOUS | Status: AC | PRN
  Administered 2016-11-22: 25 via INTRAVENOUS

## 2016-11-25 ENCOUNTER — Other Ambulatory Visit: Payer: Self-pay | Admitting: Surgery

## 2016-11-25 DIAGNOSIS — D351 Benign neoplasm of parathyroid gland: Secondary | ICD-10-CM

## 2016-11-29 ENCOUNTER — Ambulatory Visit
Admission: RE | Admit: 2016-11-29 | Discharge: 2016-11-29 | Disposition: A | Discharge status: Home / Self Care | Payer: Medicare Other | Source: Ambulatory Visit | Attending: Surgery | Admitting: Surgery

## 2016-11-29 DIAGNOSIS — D351 Benign neoplasm of parathyroid gland: Secondary | ICD-10-CM

## 2016-11-29 DIAGNOSIS — E042 Nontoxic multinodular goiter: Secondary | ICD-10-CM | POA: Diagnosis not present

## 2016-12-08 ENCOUNTER — Other Ambulatory Visit: Payer: Self-pay | Admitting: Surgery

## 2016-12-08 DIAGNOSIS — E210 Primary hyperparathyroidism: Secondary | ICD-10-CM

## 2016-12-09 ENCOUNTER — Other Ambulatory Visit: Payer: Self-pay | Admitting: Surgery

## 2016-12-09 ENCOUNTER — Ambulatory Visit
Admission: RE | Admit: 2016-12-09 | Discharge: 2016-12-09 | Disposition: A | Discharge status: Home / Self Care | Payer: Medicare Other | Source: Ambulatory Visit | Attending: Surgery | Admitting: Surgery

## 2016-12-09 DIAGNOSIS — E210 Primary hyperparathyroidism: Secondary | ICD-10-CM

## 2016-12-09 DIAGNOSIS — E041 Nontoxic single thyroid nodule: Secondary | ICD-10-CM

## 2016-12-09 DIAGNOSIS — R221 Localized swelling, mass and lump, neck: Secondary | ICD-10-CM | POA: Diagnosis not present

## 2016-12-09 MED ORDER — IOPAMIDOL (ISOVUE-300) INJECTION 61%: 75.0000 mL | Freq: Once | INTRAVENOUS | Status: DC | PRN

## 2016-12-14 ENCOUNTER — Ambulatory Visit
Admission: RE | Admit: 2016-12-14 | Discharge: 2016-12-14 | Disposition: A | Discharge status: Home / Self Care | Payer: Medicare Other | Source: Ambulatory Visit | Attending: Surgery | Admitting: Surgery

## 2016-12-14 ENCOUNTER — Other Ambulatory Visit (HOSPITAL_COMMUNITY)
Admission: RE | Admit: 2016-12-14 | Discharge: 2016-12-14 | Disposition: A | Discharge status: Home / Self Care | Payer: Medicare Other | Source: Ambulatory Visit | Attending: Radiology | Admitting: Radiology

## 2016-12-14 DIAGNOSIS — E041 Nontoxic single thyroid nodule: Secondary | ICD-10-CM | POA: Insufficient documentation

## 2016-12-15 NOTE — Progress Notes (Signed)
Please contact patient and notify of benign pathology results.  Tyreon Frigon M. Gitel Beste, MD, FACS [facility] Office: [phone]   

## 2016-12-16 ENCOUNTER — Ambulatory Visit: Payer: Self-pay | Admitting: Surgery

## 2016-12-16 NOTE — Progress Notes (Signed)
Please contact patient and notify of benign pathology results.  Jaimes Eckert M. Kylena Mole, MD, FACS [facility] Office: [phone]   

## 2016-12-30 ENCOUNTER — Encounter (HOSPITAL_COMMUNITY): Payer: Self-pay | Admitting: *Deleted

## 2016-12-30 NOTE — Progress Notes (Signed)
Pt denies SOB, chest pain, and being under the care of a cardiologist. Pt denies having a cardiac acth but stated that an echo and stress test were performed > 10 years ago. Pt made aware to stop taking  Aspirin, vitamins, fish oil, CO Q 10 and herbal medications. Do not take any NSAIDs ie: Ibuprofen, Advil, Naproxen, BC and Goody Powder or any medication containing Aspirin. Pt verbalized understanding of all pre-op instructions.

## 2017-01-01 ENCOUNTER — Encounter (HOSPITAL_COMMUNITY): Payer: Self-pay | Admitting: Surgery

## 2017-01-01 MED ORDER — CEFAZOLIN SODIUM-DEXTROSE 2-4 GM/100ML-% IV SOLN
2.0000 g | INTRAVENOUS | Status: AC
  Administered 2017-01-02: 2 g via INTRAVENOUS
  Filled 2017-01-01: qty 100

## 2017-01-01 NOTE — H&P (Signed)
General Surgery [facility]  Shannon [name] DOB: 05-09-1951 Married / Language: English / Race: White Female  History of Present Illness   The patient is a 66 year old female who presents with a parathyroid neoplasm.  Patient is referred by Dr. Philemon Kingdom for evaluation of primary hyperparathyroidism. Patient's primary care physician is Dr. Jill Alexanders. Patient was noted to have elevated serum calcium levels over the past several years. Additional testing showed a calcium level ranging from 11.2-12.1. Intact PTH levels recently have ranged from 127-169. 24-hour urine collection was obtained and was elevated at 476. Vitamin D levels are normal. Patient does have documented osteoporosis which was significantly worse on her last bone density scan. Patient does complain of lower extremity pain and discomfort which is limiting her physical activity. She denies nephrolithiasis. She has intermittent problems with weakness, frequent urination, and fatigue. There is no family history of endocrine disease. Patient has had no prior head or neck surgery. She presents today for further evaluation and recommendations.   Past Surgical History Hemorrhoidectomy  Tonsillectomy   Diagnostic Studies History Colonoscopy  5-10 years ago Mammogram  within last year  Allergies Aspirin Adult Low Dose *ANALGESICS - NonNarcotic*   Medication History  Proventil HFA (108 (90 Base)MCG/ACT Aerosol Soln, Inhalation) Active. Vitamin D3 (2000UNIT Tablet, Oral) Active. Co Q 10 (10MG  Capsule, Oral) Active. Magnesium (250MG  Tablet, Oral) Active. Vitamin B12 (100MCG Tablet, Oral) Active. Tylenol Extra Strength (500MG  Tablet, Oral) Active. Medications Reconciled  Social History  Alcohol use  Moderate alcohol use. Caffeine use  Coffee, Tea. No drug use  Tobacco use  Former smoker.  Family History  Heart Disease  Brother. Respiratory Condition   Father.  Pregnancy / Birth History  Age at menarche  64 years. Gravida  3 Para  2  Other Problems  Bladder Problems  Other disease, cancer, significant illness     Review of Systems General Present- Fatigue. Not Present- Appetite Loss, Chills, Fever, Night Sweats, Weight Gain and Weight Loss. Skin Present- Dryness. Not Present- Change in Wart/Mole, Hives, Jaundice, New Lesions, Non-Healing Wounds, Rash and Ulcer. Cardiovascular Present- Leg Cramps. Not Present- Chest Pain, Difficulty Breathing Lying Down, Palpitations, Rapid Heart Rate, Shortness of Breath and Swelling of Extremities. Female Genitourinary Present- Frequency. Not Present- Nocturia, Painful Urination, Pelvic Pain and Urgency. Musculoskeletal Present- Joint Pain, Muscle Pain and Muscle Weakness. Not Present- Back Pain, Joint Stiffness and Swelling of Extremities. Neurological Present- Headaches, Numbness and Tingling. Not Present- Decreased Memory, Fainting, Seizures, Tremor, Trouble walking and Weakness. Psychiatric Present- Anxiety. Not Present- Bipolar, Change in Sleep Pattern, Depression, Fearful and Frequent crying.  Vitals  Weight: 120.6 lb Height: 65in Body Surface Area: 1.6 m Body Mass Index: 20.07 kg/m  Temp.: 97.89F(Oral)  Pulse: 76 (Regular)  BP: 122/80 (Sitting, Left Arm, Standard)   Physical Exam  The physical exam findings are as follows: Note:General - appears comfortable, no distress; not diaphorectic  HEENT - normocephalic; sclerae clear, gaze conjugate; mucous membranes moist, dentition good; voice normal  Neck - symmetric on extension; no palpable anterior or posterior cervical adenopathy; no palpable masses in the thyroid bed  Chest - clear bilaterally without rhonchi, rales, or wheeze  Cor - regular rhythm with normal rate; no significant murmur  Ext - non-tender without significant edema or lymphedema  Neuro - grossly intact; no tremor    Assessment &  Plan  PRIMARY HYPERPARATHYROIDISM (E21.0)  Pt Education - Pamphlet Given - The Parathyroid Surgery Book: discussed with patient and  provided information. Follow Up - Call [facility] office after tests / studies doneto discuss further plans  Patient presents with signs and symptoms of primary hyperparathyroidism. Patient is given written literature on parathyroid disease to review at home.  I have recommended proceeding with nuclear medicine parathyroid scan in hopes of confirming her diagnosis and localizing a parathyroid adenoma. We will make arrangements for the study in the near future. If it is positive, then I think she will be a good candidate for minimally invasive surgery. We have discussed the procedure today. This would be performed as an outpatient. If the sestamibi scan is unrevealing, then we will proceed with high resolution ultrasound examination of the neck. Patient understands and agrees to proceed.  We will contact the patient with the results of her nuclear medicine scan.  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  Plan minimally invasive procedure.  The risks and benefits of the procedure have been discussed at length with the patient.  The patient understands the proposed procedure, potential alternative treatments, and the course of recovery to be expected.  All of the patient's questions have been answered at this time.  The patient wishes to proceed with surgery.  Earnstine Regal, MD, [facility] Office: [phone]

## 2017-01-02 ENCOUNTER — Ambulatory Visit (HOSPITAL_COMMUNITY): Payer: Medicare Other | Admitting: Anesthesiology

## 2017-01-02 ENCOUNTER — Ambulatory Visit (HOSPITAL_COMMUNITY)
Admission: RE | Admit: 2017-01-02 | Discharge: 2017-01-02 | Disposition: A | Discharge status: Home / Self Care | Payer: Medicare Other | Source: Ambulatory Visit | Attending: Surgery | Admitting: Surgery

## 2017-01-02 ENCOUNTER — Encounter (HOSPITAL_COMMUNITY): Payer: Self-pay | Admitting: General Practice

## 2017-01-02 ENCOUNTER — Encounter (HOSPITAL_COMMUNITY)
Admission: RE | Disposition: A | Discharge status: Home / Self Care | Payer: Self-pay | Source: Ambulatory Visit | Attending: Surgery

## 2017-01-02 DIAGNOSIS — Z87891 Personal history of nicotine dependence: Secondary | ICD-10-CM | POA: Insufficient documentation

## 2017-01-02 DIAGNOSIS — Z79899 Other long term (current) drug therapy: Secondary | ICD-10-CM | POA: Diagnosis not present

## 2017-01-02 DIAGNOSIS — J45909 Unspecified asthma, uncomplicated: Secondary | ICD-10-CM | POA: Insufficient documentation

## 2017-01-02 DIAGNOSIS — E213 Hyperparathyroidism, unspecified: Secondary | ICD-10-CM

## 2017-01-02 DIAGNOSIS — D351 Benign neoplasm of parathyroid gland: Secondary | ICD-10-CM | POA: Diagnosis not present

## 2017-01-02 DIAGNOSIS — M461 Sacroiliitis, not elsewhere classified: Secondary | ICD-10-CM | POA: Diagnosis not present

## 2017-01-02 DIAGNOSIS — G709 Myoneural disorder, unspecified: Secondary | ICD-10-CM | POA: Diagnosis not present

## 2017-01-02 DIAGNOSIS — F419 Anxiety disorder, unspecified: Secondary | ICD-10-CM | POA: Insufficient documentation

## 2017-01-02 DIAGNOSIS — E210 Primary hyperparathyroidism: Secondary | ICD-10-CM | POA: Insufficient documentation

## 2017-01-02 HISTORY — PX: PARATHYROIDECTOMY: SHX19

## 2017-01-02 HISTORY — DX: Nausea with vomiting, unspecified: R11.2

## 2017-01-02 HISTORY — DX: Primary hyperparathyroidism: E21.0

## 2017-01-02 HISTORY — DX: Other specified postprocedural states: Z98.890

## 2017-01-02 LAB — CBC
HCT: 41.8 % (ref 36.0–46.0)
HEMOGLOBIN: 13.7 g/dL (ref 12.0–15.0)
MCH: 31.9 pg (ref 26.0–34.0)
MCHC: 32.8 g/dL (ref 30.0–36.0)
MCV: 97.2 fL (ref 78.0–100.0)
Platelets: 250 10*3/uL (ref 150–400)
RBC: 4.3 MIL/uL (ref 3.87–5.11)
RDW: 13.7 % (ref 11.5–15.5)
WBC: 4.3 10*3/uL (ref 4.0–10.5)

## 2017-01-02 SURGERY — PARATHYROIDECTOMY
Anesthesia: General | Site: Neck | Laterality: Right

## 2017-01-02 MED ORDER — 0.9 % SODIUM CHLORIDE (POUR BTL) OPTIME
TOPICAL | Status: DC | PRN
  Administered 2017-01-02: 1000 mL

## 2017-01-02 MED ORDER — LIDOCAINE HCL (CARDIAC) 20 MG/ML IV SOLN
INTRAVENOUS | Status: DC | PRN
  Administered 2017-01-02: 40 mg via INTRAVENOUS

## 2017-01-02 MED ORDER — FENTANYL CITRATE (PF) 100 MCG/2ML IJ SOLN
INTRAMUSCULAR | Status: DC | PRN
  Administered 2017-01-02 (×2): 50 ug via INTRAVENOUS
  Administered 2017-01-02: 100 ug via INTRAVENOUS

## 2017-01-02 MED ORDER — OXYCODONE HCL 5 MG/5ML PO SOLN: 5.0000 mg | Freq: Once | ORAL | Status: AC | PRN

## 2017-01-02 MED ORDER — SUGAMMADEX SODIUM 200 MG/2ML IV SOLN
INTRAVENOUS | Status: AC
  Filled 2017-01-02: qty 2

## 2017-01-02 MED ORDER — CHLORHEXIDINE GLUCONATE CLOTH 2 % EX PADS: 6.0000 | MEDICATED_PAD | Freq: Once | CUTANEOUS | Status: DC

## 2017-01-02 MED ORDER — HEMOSTATIC AGENTS (NO CHARGE) OPTIME
TOPICAL | Status: DC | PRN
  Administered 2017-01-02: 1 via TOPICAL

## 2017-01-02 MED ORDER — ONDANSETRON HCL 4 MG/2ML IJ SOLN
INTRAMUSCULAR | Status: AC
  Filled 2017-01-02: qty 2

## 2017-01-02 MED ORDER — BUPIVACAINE HCL (PF) 0.25 % IJ SOLN
INTRAMUSCULAR | Status: DC | PRN
  Administered 2017-01-02: 7 mL

## 2017-01-02 MED ORDER — OXYCODONE HCL 5 MG PO TABS
ORAL_TABLET | ORAL | Status: AC
  Filled 2017-01-02: qty 1

## 2017-01-02 MED ORDER — LACTATED RINGERS IV SOLN
INTRAVENOUS | Status: DC
  Administered 2017-01-02: 09:00:00 via INTRAVENOUS

## 2017-01-02 MED ORDER — LIDOCAINE 2% (20 MG/ML) 5 ML SYRINGE
INTRAMUSCULAR | Status: AC
  Filled 2017-01-02: qty 10

## 2017-01-02 MED ORDER — ONDANSETRON HCL 4 MG/2ML IJ SOLN
INTRAMUSCULAR | Status: DC | PRN
  Administered 2017-01-02: 4 mg via INTRAVENOUS

## 2017-01-02 MED ORDER — PROPOFOL 10 MG/ML IV BOLUS
INTRAVENOUS | Status: DC | PRN
  Administered 2017-01-02: 30 mg via INTRAVENOUS
  Administered 2017-01-02: 100 mg via INTRAVENOUS
  Administered 2017-01-02: 30 mg via INTRAVENOUS

## 2017-01-02 MED ORDER — FENTANYL CITRATE (PF) 100 MCG/2ML IJ SOLN
INTRAMUSCULAR | Status: AC
  Filled 2017-01-02: qty 2

## 2017-01-02 MED ORDER — OXYCODONE HCL 5 MG PO TABS
5.0000 mg | ORAL_TABLET | Freq: Once | ORAL | Status: AC | PRN
  Administered 2017-01-02: 5 mg via ORAL

## 2017-01-02 MED ORDER — PROPOFOL 10 MG/ML IV BOLUS
INTRAVENOUS | Status: AC
  Filled 2017-01-02: qty 20

## 2017-01-02 MED ORDER — FENTANYL CITRATE (PF) 100 MCG/2ML IJ SOLN
INTRAMUSCULAR | Status: AC
  Administered 2017-01-02: 50 ug via INTRAVENOUS
  Filled 2017-01-02: qty 2

## 2017-01-02 MED ORDER — HYDROCODONE-ACETAMINOPHEN 5-325 MG PO TABS: 1.0000 | ORAL_TABLET | ORAL | 0 refills | Status: DC | PRN

## 2017-01-02 MED ORDER — BUPIVACAINE HCL (PF) 0.25 % IJ SOLN
INTRAMUSCULAR | Status: AC
  Filled 2017-01-02: qty 30

## 2017-01-02 MED ORDER — PHENYLEPHRINE HCL 10 MG/ML IJ SOLN
INTRAMUSCULAR | Status: DC | PRN
  Administered 2017-01-02: 15 ug/min via INTRAVENOUS

## 2017-01-02 MED ORDER — MIDAZOLAM HCL 2 MG/2ML IJ SOLN
INTRAMUSCULAR | Status: AC
  Filled 2017-01-02: qty 2

## 2017-01-02 MED ORDER — FENTANYL CITRATE (PF) 100 MCG/2ML IJ SOLN
25.0000 ug | INTRAMUSCULAR | Status: DC | PRN
  Administered 2017-01-02 (×2): 50 ug via INTRAVENOUS

## 2017-01-02 MED ORDER — ROCURONIUM BROMIDE 50 MG/5ML IV SOSY
PREFILLED_SYRINGE | INTRAVENOUS | Status: AC
  Filled 2017-01-02: qty 5

## 2017-01-02 MED ORDER — SUGAMMADEX SODIUM 200 MG/2ML IV SOLN
INTRAVENOUS | Status: DC | PRN
  Administered 2017-01-02: 150 mg via INTRAVENOUS

## 2017-01-02 MED ORDER — ROCURONIUM BROMIDE 100 MG/10ML IV SOLN
INTRAVENOUS | Status: DC | PRN
  Administered 2017-01-02: 40 mg via INTRAVENOUS

## 2017-01-02 SURGICAL SUPPLY — 53 items
ATTRACTOMAT 16X20 MAGNETIC DRP (DRAPES) ×3 IMPLANT
BLADE SURG 15 STRL LF DISP TIS (BLADE) ×1 IMPLANT
BLADE SURG 15 STRL SS (BLADE) ×3
CANISTER SUCT 3000ML PPV (MISCELLANEOUS) ×3 IMPLANT
CHLORAPREP W/TINT 26ML (MISCELLANEOUS) ×3 IMPLANT
CLIP TI MEDIUM 6 (CLIP) ×3 IMPLANT
CLIP TI WIDE RED SMALL 6 (CLIP) ×5 IMPLANT
CLOSURE WOUND 1/2 X4 (GAUZE/BANDAGES/DRESSINGS) ×1
CONT SPEC 4OZ CLIKSEAL STRL BL (MISCELLANEOUS) ×3 IMPLANT
COVER SURGICAL LIGHT HANDLE (MISCELLANEOUS) ×3 IMPLANT
CRADLE DONUT ADULT HEAD (MISCELLANEOUS) ×3 IMPLANT
DRAPE LAPAROTOMY 100X72 PEDS (DRAPES) ×3 IMPLANT
DRAPE UTILITY XL STRL (DRAPES) ×3 IMPLANT
ELECT CAUTERY BLADE 6.4 (BLADE) ×3 IMPLANT
ELECT REM PT RETURN 9FT ADLT (ELECTROSURGICAL) ×3
ELECTRODE REM PT RTRN 9FT ADLT (ELECTROSURGICAL) ×1 IMPLANT
GAUZE SPONGE 2X2 8PLY STRL LF (GAUZE/BANDAGES/DRESSINGS) ×1 IMPLANT
GAUZE SPONGE 4X4 16PLY XRAY LF (GAUZE/BANDAGES/DRESSINGS) ×3 IMPLANT
GLOVE BIOGEL PI IND STRL 6.5 (GLOVE) IMPLANT
GLOVE BIOGEL PI INDICATOR 6.5 (GLOVE) ×4
GLOVE SKINSENSE NS SZ7.0 (GLOVE) ×2
GLOVE SKINSENSE STRL SZ7.0 (GLOVE) IMPLANT
GLOVE SURG ORTHO 8.0 STRL STRW (GLOVE) ×3 IMPLANT
GOWN STRL REUS W/ TWL LRG LVL3 (GOWN DISPOSABLE) ×1 IMPLANT
GOWN STRL REUS W/ TWL XL LVL3 (GOWN DISPOSABLE) ×1 IMPLANT
GOWN STRL REUS W/TWL LRG LVL3 (GOWN DISPOSABLE) ×3
GOWN STRL REUS W/TWL XL LVL3 (GOWN DISPOSABLE) ×6
HEMOSTAT SURGICEL 2X4 FIBR (HEMOSTASIS) ×3 IMPLANT
ILLUMINATOR WAVEGUIDE N/F (MISCELLANEOUS) ×1 IMPLANT
KIT BASIN OR (CUSTOM PROCEDURE TRAY) ×3 IMPLANT
KIT ROOM TURNOVER OR (KITS) ×3 IMPLANT
NDL HYPO 25GX1X1/2 BEV (NEEDLE) ×1 IMPLANT
NEEDLE HYPO 25GX1X1/2 BEV (NEEDLE) ×3 IMPLANT
NS IRRIG 1000ML POUR BTL (IV SOLUTION) ×3 IMPLANT
PACK SURGICAL SETUP 50X90 (CUSTOM PROCEDURE TRAY) ×3 IMPLANT
PAD ARMBOARD 7.5X6 YLW CONV (MISCELLANEOUS) ×3 IMPLANT
PENCIL BUTTON HOLSTER BLD 10FT (ELECTRODE) ×3 IMPLANT
SPONGE GAUZE 2X2 STER 10/PKG (GAUZE/BANDAGES/DRESSINGS) ×2
SPONGE INTESTINAL PEANUT (DISPOSABLE) ×2 IMPLANT
STRIP CLOSURE SKIN 1/2X4 (GAUZE/BANDAGES/DRESSINGS) ×2 IMPLANT
SUT MNCRL AB 4-0 PS2 18 (SUTURE) ×3 IMPLANT
SUT SILK 2 0 (SUTURE)
SUT SILK 2-0 18XBRD TIE 12 (SUTURE) IMPLANT
SUT SILK 3 0 (SUTURE)
SUT SILK 3-0 18XBRD TIE 12 (SUTURE) IMPLANT
SUT VIC AB 3-0 SH 18 (SUTURE) ×3 IMPLANT
SYR BULB 3OZ (MISCELLANEOUS) ×3 IMPLANT
SYR CONTROL 10ML LL (SYRINGE) ×3 IMPLANT
TAPE CLOTH SURG 4X10 WHT LF (GAUZE/BANDAGES/DRESSINGS) ×2 IMPLANT
TOWEL OR 17X24 6PK STRL BLUE (TOWEL DISPOSABLE) ×3 IMPLANT
TOWEL OR 17X26 10 PK STRL BLUE (TOWEL DISPOSABLE) ×3 IMPLANT
TUBE CONNECTING 12'X1/4 (SUCTIONS) ×1
TUBE CONNECTING 12X1/4 (SUCTIONS) ×2 IMPLANT

## 2017-01-02 NOTE — Interval H&P Note (Signed)
History and Physical Interval Note:  01/02/2017 9:08 AM  Shannon [name]  has presented today for surgery, with the diagnosis of Primary hyperthyroidism.  The various methods of treatment have been discussed with the patient and family. After consideration of risks, benefits and other options for treatment, the patient has consented to    Procedure(s): RIGHT INFERIOR PARATHYROIDECTOMY (Right) as a surgical intervention .    The patient's history has been reviewed, patient examined, no change in status, stable for surgery.  I have reviewed the patient's chart and labs.  Questions were answered to the patient's satisfaction.    Earnstine Regal, MD, [facility] Office: [place]

## 2017-01-02 NOTE — Anesthesia Procedure Notes (Signed)
Procedure Name: Intubation Date/Time: 01/02/2017 9:54 AM Performed by: Izora Gala Pre-anesthesia Checklist: Patient identified, Emergency Drugs available, Suction available and Patient being monitored Patient Re-evaluated:Patient Re-evaluated prior to inductionOxygen Delivery Method: Circle system utilized Preoxygenation: Pre-oxygenation with 100% oxygen Intubation Type: IV induction Ventilation: Mask ventilation without difficulty Laryngoscope Size: Mac and 3 (Intubation by EMS student) Grade View: Grade II Tube type: Oral Tube size: 7.0 mm Number of attempts: 1 Airway Equipment and Method: Stylet Placement Confirmation: ETT inserted through vocal cords under direct vision,  positive ETCO2 and breath sounds checked- equal and bilateral Secured at: 22 cm Tube secured with: Tape Dental Injury: Teeth and Oropharynx as per pre-operative assessment  Comments: Intubation by EMS student.  Prolonged visualization requiring external laryngeal manipulation.  Atraumatic.  BBS by Dr. Ermalene Postin

## 2017-01-02 NOTE — Op Note (Signed)
OPERATIVE REPORT - PARATHYROIDECTOMY  Preoperative diagnosis: Primary hyperparathyroidism  Postop diagnosis: Same  Procedure: Right inferior minimally invasive parathyroidectomy  Surgeon:  Earnstine Regal, MD, FACS  Anesthesia: Gen. endotracheal  Estimated blood loss: Minimal  Preparation: ChloraPrep  Indications: The patient is a 66 year old female who presents with a parathyroid neoplasm.  Patient is referred by Dr. Philemon Kingdom for evaluation of primary hyperparathyroidism. Patient's primary care physician is Dr. Jill Alexanders. Patient was noted to have elevated serum calcium levels over the past several years. Additional testing showed a calcium level ranging from 11.2-12.1. Intact PTH levels recently have ranged from 127-169. 24-hour urine collection was obtained and was elevated at 476. Vitamin D levels are normal. Patient does have documented osteoporosis which was significantly worse on her last bone density scan. Patient does complain of lower extremity pain and discomfort which is limiting her physical activity. She denies nephrolithiasis. She has intermittent problems with weakness, frequent urination, and fatigue. Nuclear med scan was negative.  CT scan demonstrated a 37mm adenoma in the right inferior position.  Patient now comes to surgery for minimally invasive parathyroidectomy.  Procedure: Patient was prepared in the holding area. He was brought to operating room and placed in a supine position on the operating room table. Following administration of general anesthesia, the patient was positioned and then prepped and draped in the usual strict aseptic fashion. After ascertaining that an adequate level of anesthesia been achieved, a neck incision was made with a #15 blade. Dissection was carried through subcutaneous tissues and platysma. Hemostasis was obtained with the electrocautery. Skin flaps were developed circumferentially and a Weitlander retractor was placed for  exposure.  Strap muscles were incised in the midline. Strap muscles were reflected exposing the thyroid lobe. With gentle blunt dissection the thyroid lobe was mobilized.  Dissection was carried through adipose tissue and an enlarged parathyroid gland was identified. It was gently mobilized. Vascular structures were divided between small and medium ligaclips. Care was taken to avoid the recurrent laryngeal nerve and the esophagus. The parathyroid gland was completely excised. It was submitted to pathology where frozen section confirmed parathyroid tissue consistent with adenoma.  Neck was irrigated with warm saline and good hemostasis was noted. Fibrillar was placed in the operative field. Strap muscles were reapproximated in the midline with interrupted 3-0 Vicryl sutures. Platysma was closed with interrupted 3-0 Vicryl sutures. Skin was closed with a running 4-0 Monocryl subcuticular suture. Marcaine was infiltrated circumferentially. Wound was washed and dried and Dermabond was applied. Patient was awakened from anesthesia and brought to the recovery room. The patient tolerated the procedure well.   Earnstine Regal, MD, [facility]

## 2017-01-02 NOTE — Anesthesia Preprocedure Evaluation (Signed)
Anesthesia Evaluation  Patient identified by MRN, date of birth, ID band Patient awake    Reviewed: Allergy & Precautions, NPO status , Patient's Chart, lab work & pertinent test results  History of Anesthesia Complications (+) PONV and history of anesthetic complications  Airway Mallampati: I  TM Distance: >3 FB Neck ROM: Full    Dental  (+) Teeth Intact   Pulmonary neg shortness of breath, asthma , former smoker,    breath sounds clear to auscultation       Cardiovascular negative cardio ROS   Rhythm:Regular     Neuro/Psych Anxiety  Neuromuscular disease    GI/Hepatic negative GI ROS, Neg liver ROS,   Endo/Other  negative endocrine ROS  Renal/GU negative Renal ROS     Musculoskeletal   Abdominal   Peds  Hematology negative hematology ROS (+)   Anesthesia Other Findings   Reproductive/Obstetrics                             Anesthesia Physical Anesthesia Plan  ASA: II  Anesthesia Plan: General   Post-op Pain Management:    Induction: Intravenous  Airway Management Planned: Oral ETT  Additional Equipment: None  Intra-op Plan:   Post-operative Plan: Extubation in OR  Informed Consent: I have reviewed the patients History and Physical, chart, labs and discussed the procedure including the risks, benefits and alternatives for the proposed anesthesia with the patient or authorized representative who has indicated his/her understanding and acceptance.   Dental advisory given  Plan Discussed with: CRNA and Surgeon  Anesthesia Plan Comments:         Anesthesia Quick Evaluation

## 2017-01-02 NOTE — Transfer of Care (Signed)
Immediate Anesthesia Transfer of Care Note  Patient: Shannon [name]  Procedure(s) Performed: Procedure(s): RIGHT INFERIOR PARATHYROIDECTOMY (Right)  Patient Location: PACU  Anesthesia Type:General  Level of Consciousness: awake, alert , oriented and patient cooperative  Airway & Oxygen Therapy: Patient Spontanous Breathing and Patient connected to nasal cannula oxygen  Post-op Assessment: Report given to RN, Post -op Vital signs reviewed and stable and Patient moving all extremities  Post vital signs: Reviewed and stable  Last Vitals:  Vitals:   01/02/17 0708  BP: 130/67  Pulse: 72  Resp: 20  Temp: 36.5 C    Last Pain: There were no vitals filed for this visit.    Patients Stated Pain Goal: 2 (10/02/81 5003)  Complications: No apparent anesthesia complications

## 2017-01-03 ENCOUNTER — Encounter (HOSPITAL_COMMUNITY): Payer: Self-pay | Admitting: Surgery

## 2017-01-06 NOTE — Anesthesia Postprocedure Evaluation (Addendum)
Anesthesia Post Note  Patient: Shannon [name]  Procedure(s) Performed: Procedure(s) (LRB): RIGHT INFERIOR PARATHYROIDECTOMY (Right)  Patient location during evaluation: PACU Anesthesia Type: General Level of consciousness: awake and alert Pain management: pain level controlled Vital Signs Assessment: post-procedure vital signs reviewed and stable Respiratory status: spontaneous breathing, nonlabored ventilation, respiratory function stable and patient connected to nasal cannula oxygen Cardiovascular status: blood pressure returned to baseline and stable Postop Assessment: no signs of nausea or vomiting Anesthetic complications: no       Last Vitals:  Vitals:   01/02/17 1205 01/02/17 1220  BP: (!) 157/87 (!) 178/81  Pulse: 68 (!) 59  Resp: 19 15  Temp:  36.5 C    Last Pain:  Vitals:   01/02/17 1220  PainSc: 4                  Darene Nappi

## 2017-01-17 ENCOUNTER — Telehealth: Payer: Self-pay

## 2017-01-17 ENCOUNTER — Telehealth: Payer: Self-pay | Admitting: Internal Medicine

## 2017-01-17 NOTE — Telephone Encounter (Signed)
Please advise. Thank you

## 2017-01-17 NOTE — Telephone Encounter (Signed)
Let's keep the appointment on 04/27.

## 2017-01-17 NOTE — Telephone Encounter (Signed)
Called to advise patient to keep follow up appointment as it is. Patient had no further questions.

## 2017-01-17 NOTE — Telephone Encounter (Signed)
Pt called in and said that she just had her surgery a couple of weeks ago and she wanted to know if Dr. Cruzita Lederer wants to see her for her upcoming appointment or push it out.

## 2017-01-17 NOTE — Telephone Encounter (Signed)
Called patient and advised of Dr.Gherghe's note. Patient understood and no questions.

## 2017-01-19 DIAGNOSIS — E210 Primary hyperparathyroidism: Secondary | ICD-10-CM | POA: Diagnosis not present

## 2017-02-03 ENCOUNTER — Ambulatory Visit (INDEPENDENT_AMBULATORY_CARE_PROVIDER_SITE_OTHER): Payer: Medicare Other | Admitting: Internal Medicine

## 2017-02-03 ENCOUNTER — Encounter: Payer: Self-pay | Admitting: Internal Medicine

## 2017-02-03 VITALS — BP 108/58 | HR 59 | Resp 16 | Ht 65.0 in | Wt 128.2 lb

## 2017-02-03 DIAGNOSIS — M810 Age-related osteoporosis without current pathological fracture: Secondary | ICD-10-CM | POA: Diagnosis not present

## 2017-02-03 DIAGNOSIS — E210 Primary hyperparathyroidism: Secondary | ICD-10-CM | POA: Diagnosis not present

## 2017-02-03 DIAGNOSIS — E042 Nontoxic multinodular goiter: Secondary | ICD-10-CM | POA: Diagnosis not present

## 2017-02-03 DIAGNOSIS — E213 Hyperparathyroidism, unspecified: Secondary | ICD-10-CM | POA: Diagnosis not present

## 2017-02-03 LAB — VITAMIN D 25 HYDROXY (VIT D DEFICIENCY, FRACTURES): VITD: 32.31 ng/mL (ref 30.00–100.00)

## 2017-02-03 NOTE — Patient Instructions (Signed)
Please stop at the lab.  Please come back in 4 months.   

## 2017-02-03 NOTE — Progress Notes (Signed)
Patient ID: Shannon [name], female   DOB: 1951-10-09, 66 y.o.   MRN: [id number]    HPI  Shannon [name] is a 66 y.o.-year-old female, initially referred by her PCP, Dr. Redmond School, returning for f/u for Primary hyperparathyroidism. Last visit 4 mo ago.  Since last visit, she had parathyroidectomy by Dr. Harlow Asa - 01/02/2017.  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  She initially felt well after the surgery, but then started to be fatigued again. Her calcium was still high after the Sx >> 01/19/2017: 11.9, while the PTH decreased nicely to 76 at follow up with Dr. Harlow Asa ~ 2 weeks postop.  Reviewed the additional tests she had since last OV: 11/22/2016: Technetium sestamibi scan: 1. No evidence of ectopic parathyroid adenoma. 2. Asymmetric nodular activity in the left peritracheal region on the delayed images could reflect asymmetric thyroid activity or a left-sided parathyroid adenoma.  11/30/2016: Neck ultrasound: There are 2 thyroid nodules as described. - The left mid thyroid nodule measures 2.7 x 2.2 x 1.2 cm solid/almost completely solid (2), isoechoic (1), and meets fine-needle aspiration criteria.  - The other nodule measures 0.9 cm x 0.7 x 0.6 cm,  solid/almost completely solid (2), hyperechoic (1), taller-than-wide (3), but not meeting criteria for biopsy. - There are no obvious masses external to the thyroid gland to suggest parathyroid adenoma.  12/09/2016: 4D CT: 1. 13 mm mass posterior and inferior to the right thyroid lobe suspicious for parathyroid adenoma. 2. Additional subcentimeter soft tissue nodules more inferiorly in the right neck, indeterminate. These may reflect small lymph nodes, however an additional parathyroid adenoma is not excluded (particularly the most inferior nodule just above the thoracic inlet). 3. 2.2 cm left thyroid nodule as described on ultrasound.  12/14/2016: Thyroid nodule FNA: benign  Reviewed and addended hx: Pt was dx with hypercalcemia at least in  2012. I reviewed pt's pertinent labs: Lab Results  Component Value Date   PTH 127 (H) 10/07/2016   PTH 169 (H) 09/07/2016   CALCIUM 11.8 (H) 10/07/2016   CALCIUM 12.1 (H) 08/24/2016   CALCIUM 11.2 (H) 06/04/2012   CALCIUM 11.9 (H) 10/06/2011   Additional labs were performed at last visit confirmed primary hyperparathyroidism:  Component     Latest Ref Rng & Units 10/07/2016  BUN     7 - 25 mg/dL 11  Creatinine     0.50 - 0.99 mg/dL 0.62  Calcium     8.6 - 10.4 mg/dL 11.8 (H)  GFR, Est Non African American     >=60 mL/min >89  Vitamin D 1, 25 (OH) Total     18 - 72 pg/mL 117 (H)  Vitamin D3 1, 25 (OH)     pg/mL 117  Vitamin D2 1, 25 (OH)     pg/mL <8  PTH     15 - 65 pg/mL 127 (H)  Magnesium     1.5 - 2.5 mg/dL 2.0  Phosphorus     2.3 - 4.6 mg/dL 2.3   Urinary calcium was also high: Component     Latest Ref Rng & Units 10/11/2016  Calcium, Ur     Not estab mg/dL 17  Calcium, 24 hour urine     35 - 250 mg/24 h 476 (H)  Creatinine, Urine     20 - 320 mg/dL 44  Creatinine, 24H Ur     0.63 - 2.50 g/24 h 1.23   Patient also has osteoporosis.  Latest bone density report per records brought by patient. The  DEXA scan was done at [facility]: Date L1-L4 T score FN T score  05/27/2016 -3.6 RFN -2.9 LFN -3.2   Prev: Date L1-L4 T score FN T score  05/17/2012  -3.1  RFN -2.7 LFN -3.0   She refused antiresorptive meds in the past.  She has a history of left thumb fracture in 2001 while playing tennis. She had several falls playing sports or missing a step >> no fxs.  No h/o kidney stones.  No h/o CKD. Last BUN/Cr: Lab Results  Component Value Date   BUN 11 10/07/2016   BUN 13 08/24/2016   CREATININE 0.62 10/07/2016   CREATININE 0.85 08/24/2016   No h/o vitamin D deficiency. Reviewed vit D levels: Lab Results  Component Value Date   VD25OH 35 08/24/2016   VD25OH 31 06/04/2012   She takes vitamin D 2000 units daily; she also eats green, leafy,  vegetables. No dairy as son is allergic.   Pt was on calcium 1200 mg daily for years >> stopped in last year. She had weakness and pain in legs, HAs, and mm cramps >> better after stopping calcium.  Pt does not have a FH of hypercalcemia, pituitary tumors, thyroid cancer.   ROS:  Constitutional: no weight gain/no weight loss, + fatigue, no subjective hyperthermia, no subjective hypothermia Eyes: no blurry vision, no xerophthalmia ENT: no sore throat, no nodules palpated in throat, no dysphagia, no odynophagia, no hoarseness Cardiovascular: no CP/no SOB/+ occas. palpitations/no leg swelling Respiratory: no cough/no SOB/no wheezing Gastrointestinal: + occas. N/no V/no D/no C/no acid reflux Musculoskeletal: + muscle aches/+ joint aches Skin: no rashes, no hair loss Neurological: + tremors/no numbness/no tingling/no dizziness, + R sided HAs   I reviewed pt's medications, allergies, PMH, social hx, family hx, and changes were documented in the history of present illness. Otherwise, unchanged from my initial visit note.  Past Medical History:  Diagnosis Date  . Allergy    RHINTIS  . Asthma   . Fibrocystic disease of breast   . Hemorrhoids   . Hx of colonic polyps   . Menopause   . Mitral valve prolapse   . PONV (postoperative nausea and vomiting)   . Primary hyperparathyroidism ([place])    right   Past Surgical History:  Procedure Laterality Date  . APPENDECTOMY  1964  . COLONOSCOPY    . CYSTOSTOMY W/ BLADDER BIOPSY     Dr. Rosana Hoes  . HEMORRHOID SURGERY     Dr. Truitt Leep  . PARATHYROIDECTOMY Right 01/02/2017   Procedure: RIGHT INFERIOR PARATHYROIDECTOMY;  Surgeon: Armandina Gemma, MD;  Location: [facility];  Service: General;  Laterality: Right;  . THUMB FUSION  1997   Dr Maxie Better  . TONSILLECTOMY AND ADENOIDECTOMY    . tummy tuck     Social History   Social History  . Marital status: Married    Spouse name: N/A  . Number of children: 2   Occupational History  . retired    Social  History Main Topics  . Smoking status: Former Smoker    Packs/day: 0.25    Years: 10.00    Types: Cigarettes    Quit date: 06/04/1985  . Smokeless tobacco: Never Used  . Alcohol use 3.0 oz/week    5 Glasses of wine per week  . Drug use: No   Current Outpatient Prescriptions on File Prior to Visit  Medication Sig Dispense Refill  . albuterol (PROVENTIL HFA;VENTOLIN HFA) 108 (90 Base) MCG/ACT inhaler Inhale 2 puffs into the lungs every 6 (  six) hours as needed for wheezing or shortness of breath. 1 Inhaler 1  . cholecalciferol (VITAMIN D) 1000 UNITS tablet Take 1,000 Units by mouth daily.    Marland Kitchen COENZYME Q10 PO Take 1 tablet by mouth daily.    . Magnesium 250 MG TABS Take 250 mg by mouth daily.     . vitamin B-12 (CYANOCOBALAMIN) 100 MCG tablet Take 100 mcg by mouth daily.     No current facility-administered medications on file prior to visit.    Allergies  Allergen Reactions  . Asa [Aspirin] Nausea Only    Minor bleeding   Family History  Problem Relation Age of Onset  . Cancer Father 54    Lung cancer  . Heart disease Father   . Hyperlipidemia Father   . Heart disease Mother 68    Sudden death presumed MI  . Heart disease Brother 69    Presumed CHF  . Heart disease Brother    Significant FH of early death 2/2 AMIs in parents and brothers.  PE: BP (!) 108/58   Pulse (!) 59   Resp 16   Ht 5\' 5"  (1.651 m)   Wt 128 lb 4 oz (58.2 kg)   SpO2 98%   BMI 21.34 kg/m  Wt Readings from Last 3 Encounters:  02/03/17 128 lb 4 oz (58.2 kg)  01/02/17 119 lb 4 oz (54.1 kg)  10/07/16 119 lb 2 oz (54 kg)   Constitutional: overweight, in NAD. No kyphosis. Eyes: PERRLA, EOMI, no exophthalmos ENT: moist mucous membranes, no thyromegaly, Cervical scar without erythema, swelling, or dysesthesia, no cervical lymphadenopathy Cardiovascular: RRR, No MRG Respiratory: CTA B Gastrointestinal: abdomen soft, NT, ND, BS+ Musculoskeletal: no deformities, strength intact in all 4 Skin: moist,  warm, no rashes Neurological: no tremor with outstretched hands, DTR normal in all 4  Assessment: 1. Primary hyperparathyroidism  2. Thyroid nodules - new dx  Plan: 1. Patient has had several instances of elevated calcium, with the highest level being at 12.1. A corresponding intact PTH level was also high, at 169. At last visit, her vitamin D level was 35. We performed several investigations that confirmed primary hyperparathyroidism (elevated calcium and PTH, elevated 1, 25 dihydroxy vitamin D, and also high 24 hour urine calcium; normal magnesium and phosphorus at the lower limit of normal). I referred the patient to surgery and she had further tests (review today, see history of present illness) that showed right inferior parathyroid adenoma with a possible additional parathyroid adenoma. The neck ultrasound also showed a slightly large thyroid nodule, of 2.7 cm, which we biopsied and was benign. - Patient had minimally invasive right inferior parathyroidectomy which resulted in resection of a 1.3 g parathyroid adenoma. Unfortunately, calcium did not decrease after surgery (11.9) despite the good decreasing PTH (76). Pt is worried that she may need to go back to surgery and, if this is the case, she would prefer to have this as soon as possible (she is very anxious about this, she would not want to wait) - today, we will recheck her Ca + PTH and vitamin D - I will see her back in 4 mo  2. Thyroid nodules - the L thyroid nodule is large, but benign by Bx - Will keep an eye on this and the other nodule - no neck compression sxs  3. OP - 2017 DEXA scan >> worse - needs a repeat 1 year after parathyroidectomy - she refused antiresorptives, tries to exercise as much as she can  Component     Latest Ref Rng & Units 02/03/2017 02/03/2017        11:19 AM 11:19 AM  Calcium     8.7 - 10.3 mg/dL  11.1 (H)  PTH     15 - 65 pg/mL  118 (H)  VITD     30.00 - 100.00 ng/mL 32.31    Vitamin D is  normal. Unfortunately, calcium and PTH are still high. She will need of intervention, I will let Dr. Harlow Asa now. Of note, patient is very anxious and would like to have the surgery as soon as possible.  Philemon Kingdom, MD PhD [facility] Endocrinology

## 2017-02-04 LAB — PTH, INTACT AND CALCIUM
Calcium: 11.1 mg/dL — ABNORMAL HIGH (ref 8.7–10.3)
PTH: 118 pg/mL — ABNORMAL HIGH (ref 15–65)

## 2017-03-13 NOTE — Addendum Note (Signed)
Addendum  created 03/13/17 1244 by Merrilyn Legler, MD   Sign clinical note    

## 2017-03-17 ENCOUNTER — Other Ambulatory Visit (HOSPITAL_COMMUNITY): Payer: Self-pay | Admitting: Surgery

## 2017-03-17 DIAGNOSIS — E213 Hyperparathyroidism, unspecified: Secondary | ICD-10-CM

## 2017-03-21 ENCOUNTER — Encounter (HOSPITAL_COMMUNITY)
Admission: RE | Admit: 2017-03-21 | Discharge: 2017-03-21 | Disposition: A | Discharge status: Home / Self Care | Payer: Medicare Other | Source: Ambulatory Visit | Attending: Surgery | Admitting: Surgery

## 2017-03-21 ENCOUNTER — Encounter (HOSPITAL_COMMUNITY): Payer: Medicare Other

## 2017-03-21 DIAGNOSIS — E213 Hyperparathyroidism, unspecified: Secondary | ICD-10-CM

## 2017-03-21 DIAGNOSIS — E0590 Thyrotoxicosis, unspecified without thyrotoxic crisis or storm: Secondary | ICD-10-CM | POA: Diagnosis not present

## 2017-03-21 MED ORDER — TECHNETIUM TC 99M SESTAMIBI GENERIC - CARDIOLITE
25.0000 | Freq: Once | INTRAVENOUS | Status: AC | PRN
  Administered 2017-03-21: 25 via INTRAVENOUS

## 2017-03-29 ENCOUNTER — Ambulatory Visit: Payer: Self-pay | Admitting: Surgery

## 2017-04-11 ENCOUNTER — Encounter (HOSPITAL_COMMUNITY)
Admission: RE | Admit: 2017-04-11 | Discharge: 2017-04-11 | Disposition: A | Discharge status: Home / Self Care | Payer: Medicare Other | Source: Ambulatory Visit | Attending: Surgery | Admitting: Surgery

## 2017-04-11 ENCOUNTER — Encounter (HOSPITAL_COMMUNITY): Payer: Self-pay

## 2017-04-11 DIAGNOSIS — Z01818 Encounter for other preprocedural examination: Secondary | ICD-10-CM | POA: Insufficient documentation

## 2017-04-11 HISTORY — DX: Other reaction to spinal and lumbar puncture: G97.1

## 2017-04-11 LAB — CBC
HCT: 39.2 % (ref 36.0–46.0)
HEMOGLOBIN: 13 g/dL (ref 12.0–15.0)
MCH: 32.3 pg (ref 26.0–34.0)
MCHC: 33.2 g/dL (ref 30.0–36.0)
MCV: 97.3 fL (ref 78.0–100.0)
Platelets: 262 10*3/uL (ref 150–400)
RBC: 4.03 MIL/uL (ref 3.87–5.11)
RDW: 13.4 % (ref 11.5–15.5)
WBC: 5.8 10*3/uL (ref 4.0–10.5)

## 2017-04-11 LAB — BASIC METABOLIC PANEL
ANION GAP: 9 (ref 5–15)
BUN: 6 mg/dL (ref 6–20)
CALCIUM: 10.8 mg/dL — AB (ref 8.9–10.3)
CO2: 25 mmol/L (ref 22–32)
Chloride: 98 mmol/L — ABNORMAL LOW (ref 101–111)
Creatinine, Ser: 0.6 mg/dL (ref 0.44–1.00)
GFR calc non Af Amer: >60 mL/min (ref >60)
Glucose, Bld: 97 mg/dL (ref 65–99)
POTASSIUM: 3.9 mmol/L (ref 3.5–5.1)
Sodium: 132 mmol/L — ABNORMAL LOW (ref 135–145)

## 2017-04-11 NOTE — Progress Notes (Signed)
PCP - Benjiman Core Cardiologist - Einar Gip, seen him 15 years ago Chest x-ray - Denies EKG - 01/02/17 Stress Test - 15 Yrs ago ECHO - 15 yrs ago Cardiac Cath -  Denies Sleep Study -  Denies  CPAP - None   Pt denies chest pain, sob, or fever at this time. All instructions explained to the pt, with a verbal understanding of the material. Pt advised to go over the information at home to get a better understanding. The opportunity to ask questions was provided.

## 2017-04-11 NOTE — Pre-Procedure Instructions (Signed)
Shannon [name]  04/11/2017      [facility] [address] AT [facility] [address] [place] [address] Phone: [phone] Fax: [phone]    Your procedure is scheduled on April 18, 2107  Report to [facility] Admitting at 12:30 P.M.  Call this number if you have problems the morning of surgery:  [phone]   Remember:  Do not eat food or drink liquids after midnight on April 16, 2017  Take these medicines the morning of surgery with A SIP OF WATER As Needed albuterol (PROVENTIL HFA;VENTOLIN HFA), bring inhaler with you the day of your surgery. Stop taking all Aspirin, Vitamins, Fish oils, and Herbal medicaitons. Also stop all NSAIDS i.e. Advil, Motrin, Aleve, Anaprox, Naproxen, BC and Goody Powders.   Do not wear jewelry, make-up or nail polish.  Do not wear lotions, powders, or perfumes, or deoderant.  Do not shave 48 hours prior to surgery.  Men may shave face and neck.  Do not bring valuables to the hospital.  [facility] is not responsible for any belongings or valuables.  Contacts, dentures or bridgework may not be worn into surgery.  Leave your suitcase in the car.  After surgery it may be brought to your room.  For patients admitted to the hospital, discharge time will be determined by your treatment team.  Patients discharged the day of surgery will not be allowed to drive home.   Special instructions:    [place]- Preparing For Surgery  Before surgery, you can play an important role. Because skin is not sterile, your skin needs to be as free of germs as possible. You can reduce the number of germs on your skin by washing with CHG (chlorahexidine gluconate) Soap before surgery.  CHG is an antiseptic cleaner which kills germs and bonds with the skin to continue killing germs even after washing.  Please do not use if you have an allergy to CHG or antibacterial soaps. If your skin becomes  reddened/irritated stop using the CHG.  Do not shave (including legs and underarms) for at least 48 hours prior to first CHG shower. It is OK to shave your face.  Please follow these instructions carefully.   1. Shower the NIGHT BEFORE SURGERY and the MORNING OF SURGERY with CHG.   2. If you chose to wash your hair, wash your hair first as usual with your normal shampoo.  3. After you shampoo, rinse your hair and body thoroughly to remove the shampoo.  4. Use CHG as you would any other liquid soap. You can apply CHG directly to the skin and wash gently with a scrungie or a clean washcloth.   5. Apply the CHG Soap to your body ONLY FROM THE NECK DOWN.  Do not use on open wounds or open sores. Avoid contact with your eyes, ears, mouth and genitals (private parts). Wash genitals (private parts) with your normal soap.  6. Wash thoroughly, paying special attention to the area where your surgery will be performed.  7. Thoroughly rinse your body with warm water from the neck down.  8. DO NOT shower/wash with your normal soap after using and rinsing off the CHG Soap.  9. Pat yourself dry with a CLEAN TOWEL.   10. Wear CLEAN PAJAMAS   11. Place CLEAN SHEETS on your bed the night of your first shower and DO NOT SLEEP WITH PETS.  Day  of Surgery: Do not apply any deodorants/lotions. Please wear clean clothes to the hospital/surgery center.    Please read over the following fact sheets that you were given. Pain Booklet, Coughing and Deep Breathing and Surgical Site Infection Prevention

## 2017-04-17 ENCOUNTER — Observation Stay (HOSPITAL_COMMUNITY)
Admission: RE | Admit: 2017-04-17 | Discharge: 2017-04-18 | Disposition: A | Discharge status: Home / Self Care | Payer: Medicare Other | Source: Ambulatory Visit | Attending: Surgery | Admitting: Surgery

## 2017-04-17 ENCOUNTER — Encounter (HOSPITAL_COMMUNITY): Payer: Self-pay | Admitting: *Deleted

## 2017-04-17 ENCOUNTER — Encounter (HOSPITAL_COMMUNITY)
Admission: RE | Disposition: A | Discharge status: Home / Self Care | Payer: Self-pay | Source: Ambulatory Visit | Attending: Surgery

## 2017-04-17 ENCOUNTER — Ambulatory Visit (HOSPITAL_COMMUNITY): Payer: Medicare Other | Admitting: Certified Registered Nurse Anesthetist

## 2017-04-17 DIAGNOSIS — D351 Benign neoplasm of parathyroid gland: Secondary | ICD-10-CM | POA: Diagnosis not present

## 2017-04-17 DIAGNOSIS — Z87891 Personal history of nicotine dependence: Secondary | ICD-10-CM | POA: Insufficient documentation

## 2017-04-17 DIAGNOSIS — Z79899 Other long term (current) drug therapy: Secondary | ICD-10-CM | POA: Insufficient documentation

## 2017-04-17 DIAGNOSIS — J45909 Unspecified asthma, uncomplicated: Secondary | ICD-10-CM | POA: Insufficient documentation

## 2017-04-17 DIAGNOSIS — E213 Hyperparathyroidism, unspecified: Secondary | ICD-10-CM

## 2017-04-17 DIAGNOSIS — D367 Benign neoplasm of other specified sites: Secondary | ICD-10-CM | POA: Insufficient documentation

## 2017-04-17 DIAGNOSIS — E210 Primary hyperparathyroidism: Secondary | ICD-10-CM | POA: Diagnosis not present

## 2017-04-17 DIAGNOSIS — E041 Nontoxic single thyroid nodule: Secondary | ICD-10-CM | POA: Diagnosis not present

## 2017-04-17 HISTORY — PX: NECK EXPLORATION: SHX2077

## 2017-04-17 HISTORY — PX: PARATHYROIDECTOMY: SHX19

## 2017-04-17 SURGERY — PARATHYROIDECTOMY
Anesthesia: General | Site: Neck

## 2017-04-17 MED ORDER — ONDANSETRON HCL 4 MG/2ML IJ SOLN
4.0000 mg | Freq: Four times a day (QID) | INTRAMUSCULAR | Status: DC | PRN
  Administered 2017-04-18: 4 mg via INTRAVENOUS
  Filled 2017-04-17: qty 2

## 2017-04-17 MED ORDER — HYDROMORPHONE HCL 1 MG/ML IJ SOLN
0.2500 mg | INTRAMUSCULAR | Status: DC | PRN
  Administered 2017-04-17 (×3): 0.5 mg via INTRAVENOUS

## 2017-04-17 MED ORDER — ONDANSETRON 4 MG PO TBDP: 4.0000 mg | ORAL_TABLET | Freq: Four times a day (QID) | ORAL | Status: DC | PRN

## 2017-04-17 MED ORDER — HYDROCODONE-ACETAMINOPHEN 5-325 MG PO TABS
1.0000 | ORAL_TABLET | ORAL | Status: DC | PRN
  Administered 2017-04-18: 1 via ORAL
  Filled 2017-04-17: qty 1

## 2017-04-17 MED ORDER — ONDANSETRON HCL 4 MG/2ML IJ SOLN
INTRAMUSCULAR | Status: DC | PRN
  Administered 2017-04-17: 4 mg via INTRAVENOUS

## 2017-04-17 MED ORDER — CHLORHEXIDINE GLUCONATE CLOTH 2 % EX PADS: 6.0000 | MEDICATED_PAD | Freq: Once | CUTANEOUS | Status: DC

## 2017-04-17 MED ORDER — FENTANYL CITRATE (PF) 250 MCG/5ML IJ SOLN
INTRAMUSCULAR | Status: AC
  Filled 2017-04-17: qty 5

## 2017-04-17 MED ORDER — ACETAMINOPHEN 500 MG PO TABS
ORAL_TABLET | ORAL | Status: AC
  Administered 2017-04-17: 1000 mg via ORAL
  Filled 2017-04-17: qty 2

## 2017-04-17 MED ORDER — PROPOFOL 10 MG/ML IV BOLUS
INTRAVENOUS | Status: DC | PRN
  Administered 2017-04-17: 130 mg via INTRAVENOUS

## 2017-04-17 MED ORDER — LACTATED RINGERS IV SOLN
INTRAVENOUS | Status: DC
  Administered 2017-04-17: 10:00:00 via INTRAVENOUS

## 2017-04-17 MED ORDER — MIDAZOLAM HCL 2 MG/2ML IJ SOLN
INTRAMUSCULAR | Status: AC
  Filled 2017-04-17: qty 2

## 2017-04-17 MED ORDER — HYDROMORPHONE HCL 1 MG/ML IJ SOLN
INTRAMUSCULAR | Status: AC
  Filled 2017-04-17: qty 0.5

## 2017-04-17 MED ORDER — CEFAZOLIN SODIUM-DEXTROSE 2-4 GM/100ML-% IV SOLN
2.0000 g | INTRAVENOUS | Status: AC
  Administered 2017-04-17: 2 g via INTRAVENOUS

## 2017-04-17 MED ORDER — LIDOCAINE HCL (CARDIAC) 20 MG/ML IV SOLN: INTRAVENOUS | Status: DC | PRN

## 2017-04-17 MED ORDER — BUPIVACAINE HCL (PF) 0.25 % IJ SOLN
INTRAMUSCULAR | Status: AC
  Filled 2017-04-17: qty 30

## 2017-04-17 MED ORDER — ACETAMINOPHEN 500 MG PO TABS
1000.0000 mg | ORAL_TABLET | Freq: Once | ORAL | Status: AC
  Administered 2017-04-17: 1000 mg via ORAL

## 2017-04-17 MED ORDER — PROMETHAZINE HCL 25 MG/ML IJ SOLN
6.2500 mg | INTRAMUSCULAR | Status: DC | PRN
  Administered 2017-04-17: 6.25 mg via INTRAVENOUS

## 2017-04-17 MED ORDER — SCOPOLAMINE 1 MG/3DAYS TD PT72
MEDICATED_PATCH | TRANSDERMAL | Status: DC | PRN
  Administered 2017-04-17: 1 via TRANSDERMAL

## 2017-04-17 MED ORDER — MIDAZOLAM HCL 5 MG/5ML IJ SOLN
INTRAMUSCULAR | Status: DC | PRN
  Administered 2017-04-17: 2 mg via INTRAVENOUS

## 2017-04-17 MED ORDER — FENTANYL CITRATE (PF) 100 MCG/2ML IJ SOLN
INTRAMUSCULAR | Status: DC | PRN
  Administered 2017-04-17 (×2): 25 ug via INTRAVENOUS
  Administered 2017-04-17 (×2): 50 ug via INTRAVENOUS
  Administered 2017-04-17: 100 ug via INTRAVENOUS
  Administered 2017-04-17 (×2): 25 ug via INTRAVENOUS
  Administered 2017-04-17: 50 ug via INTRAVENOUS

## 2017-04-17 MED ORDER — OXYCODONE HCL 5 MG/5ML PO SOLN: 5.0000 mg | Freq: Once | ORAL | Status: AC | PRN

## 2017-04-17 MED ORDER — OXYCODONE HCL 5 MG PO TABS
5.0000 mg | ORAL_TABLET | Freq: Once | ORAL | Status: AC | PRN
  Administered 2017-04-17: 5 mg via ORAL

## 2017-04-17 MED ORDER — PROMETHAZINE HCL 25 MG/ML IJ SOLN
INTRAMUSCULAR | Status: AC
  Filled 2017-04-17: qty 1

## 2017-04-17 MED ORDER — ONDANSETRON HCL 4 MG/2ML IJ SOLN
INTRAMUSCULAR | Status: AC
  Filled 2017-04-17: qty 2

## 2017-04-17 MED ORDER — LIDOCAINE 2% (20 MG/ML) 5 ML SYRINGE
INTRAMUSCULAR | Status: DC | PRN
  Administered 2017-04-17: 60 mg via INTRAVENOUS

## 2017-04-17 MED ORDER — LACTATED RINGERS IV SOLN
INTRAVENOUS | Status: DC | PRN
  Administered 2017-04-17 (×2): via INTRAVENOUS

## 2017-04-17 MED ORDER — SUGAMMADEX SODIUM 200 MG/2ML IV SOLN
INTRAVENOUS | Status: DC | PRN
  Administered 2017-04-17: 150 mg via INTRAVENOUS

## 2017-04-17 MED ORDER — ROCURONIUM BROMIDE 100 MG/10ML IV SOLN: INTRAVENOUS | Status: DC | PRN

## 2017-04-17 MED ORDER — ACETAMINOPHEN 325 MG PO TABS: 650.0000 mg | ORAL_TABLET | Freq: Four times a day (QID) | ORAL | Status: DC | PRN

## 2017-04-17 MED ORDER — HYDROMORPHONE HCL 1 MG/ML IJ SOLN
1.0000 mg | INTRAMUSCULAR | Status: DC | PRN
  Administered 2017-04-17 – 2017-04-18 (×3): 1 mg via INTRAVENOUS
  Filled 2017-04-17 (×3): qty 1

## 2017-04-17 MED ORDER — OXYCODONE HCL 5 MG PO TABS
ORAL_TABLET | ORAL | Status: AC
  Administered 2017-04-17: 5 mg via ORAL
  Filled 2017-04-17: qty 1

## 2017-04-17 MED ORDER — HYDROMORPHONE HCL 1 MG/ML IJ SOLN
INTRAMUSCULAR | Status: AC
  Administered 2017-04-17: 0.5 mg via INTRAVENOUS
  Filled 2017-04-17: qty 1

## 2017-04-17 MED ORDER — ACETAMINOPHEN 650 MG RE SUPP: 650.0000 mg | Freq: Four times a day (QID) | RECTAL | Status: DC | PRN

## 2017-04-17 MED ORDER — ROCURONIUM BROMIDE 10 MG/ML (PF) SYRINGE
PREFILLED_SYRINGE | INTRAVENOUS | Status: DC | PRN
  Administered 2017-04-17: 10 mg via INTRAVENOUS
  Administered 2017-04-17: 30 mg via INTRAVENOUS

## 2017-04-17 MED ORDER — SCOPOLAMINE 1 MG/3DAYS TD PT72
MEDICATED_PATCH | TRANSDERMAL | Status: AC
  Filled 2017-04-17: qty 1

## 2017-04-17 MED ORDER — 0.9 % SODIUM CHLORIDE (POUR BTL) OPTIME
TOPICAL | Status: DC | PRN
  Administered 2017-04-17: 1000 mL

## 2017-04-17 MED ORDER — KCL IN DEXTROSE-NACL 20-5-0.45 MEQ/L-%-% IV SOLN
INTRAVENOUS | Status: DC
  Administered 2017-04-17: 19:00:00 via INTRAVENOUS
  Filled 2017-04-17: qty 1000

## 2017-04-17 MED ORDER — HEMOSTATIC AGENTS (NO CHARGE) OPTIME
TOPICAL | Status: DC | PRN
  Administered 2017-04-17: 1 via TOPICAL

## 2017-04-17 MED ORDER — ALBUTEROL SULFATE (2.5 MG/3ML) 0.083% IN NEBU: 3.0000 mL | INHALATION_SOLUTION | Freq: Four times a day (QID) | RESPIRATORY_TRACT | Status: DC | PRN

## 2017-04-17 SURGICAL SUPPLY — 49 items
ATTRACTOMAT 16X20 MAGNETIC DRP (DRAPES) ×3 IMPLANT
BLADE SURG 15 STRL LF DISP TIS (BLADE) ×1 IMPLANT
BLADE SURG 15 STRL SS (BLADE) ×3
CANISTER SUCT 3000ML PPV (MISCELLANEOUS) ×3 IMPLANT
CHLORAPREP W/TINT 26ML (MISCELLANEOUS) ×3 IMPLANT
CLIP TI MEDIUM 6 (CLIP) ×2 IMPLANT
CLIP TI WIDE RED SMALL 6 (CLIP) ×2 IMPLANT
CLOSURE WOUND 1/2 X4 (GAUZE/BANDAGES/DRESSINGS) ×1
CONT SPEC 4OZ CLIKSEAL STRL BL (MISCELLANEOUS) ×3 IMPLANT
COVER SURGICAL LIGHT HANDLE (MISCELLANEOUS) ×3 IMPLANT
CRADLE DONUT ADULT HEAD (MISCELLANEOUS) ×3 IMPLANT
DRAPE LAPAROTOMY 100X72 PEDS (DRAPES) ×3 IMPLANT
DRAPE UTILITY XL STRL (DRAPES) ×3 IMPLANT
ELECT CAUTERY BLADE 6.4 (BLADE) ×3 IMPLANT
ELECT REM PT RETURN 9FT ADLT (ELECTROSURGICAL) ×3
ELECTRODE REM PT RTRN 9FT ADLT (ELECTROSURGICAL) ×1 IMPLANT
GAUZE SPONGE 2X2 8PLY STRL LF (GAUZE/BANDAGES/DRESSINGS) ×1 IMPLANT
GAUZE SPONGE 4X4 12PLY STRL LF (GAUZE/BANDAGES/DRESSINGS) ×2 IMPLANT
GAUZE SPONGE 4X4 16PLY XRAY LF (GAUZE/BANDAGES/DRESSINGS) ×3 IMPLANT
GLOVE BIO SURGEON STRL SZ7.5 (GLOVE) ×2 IMPLANT
GLOVE BIOGEL PI IND STRL 7.5 (GLOVE) IMPLANT
GLOVE BIOGEL PI INDICATOR 7.5 (GLOVE) ×2
GLOVE SURG ORTHO 8.0 STRL STRW (GLOVE) ×3 IMPLANT
GLOVE SURG SS PI 7.0 STRL IVOR (GLOVE) ×4 IMPLANT
GOWN STRL REUS W/ TWL LRG LVL3 (GOWN DISPOSABLE) ×1 IMPLANT
GOWN STRL REUS W/ TWL XL LVL3 (GOWN DISPOSABLE) ×1 IMPLANT
GOWN STRL REUS W/TWL LRG LVL3 (GOWN DISPOSABLE) ×6
GOWN STRL REUS W/TWL XL LVL3 (GOWN DISPOSABLE) ×9
HEMOSTAT SURGICEL 2X4 FIBR (HEMOSTASIS) ×3 IMPLANT
ILLUMINATOR WAVEGUIDE N/F (MISCELLANEOUS) ×1 IMPLANT
KIT BASIN OR (CUSTOM PROCEDURE TRAY) ×3 IMPLANT
KIT ROOM TURNOVER OR (KITS) ×3 IMPLANT
NDL HYPO 25GX1X1/2 BEV (NEEDLE) ×1 IMPLANT
NEEDLE HYPO 25GX1X1/2 BEV (NEEDLE) ×3 IMPLANT
NS IRRIG 1000ML POUR BTL (IV SOLUTION) ×3 IMPLANT
PACK SURGICAL SETUP 50X90 (CUSTOM PROCEDURE TRAY) ×3 IMPLANT
PAD ARMBOARD 7.5X6 YLW CONV (MISCELLANEOUS) ×3 IMPLANT
PENCIL BUTTON HOLSTER BLD 10FT (ELECTRODE) ×3 IMPLANT
SPONGE GAUZE 2X2 STER 10/PKG (GAUZE/BANDAGES/DRESSINGS) ×2
STRIP CLOSURE SKIN 1/2X4 (GAUZE/BANDAGES/DRESSINGS) ×2 IMPLANT
SUT MNCRL AB 4-0 PS2 18 (SUTURE) ×3 IMPLANT
SUT SILK 3 0 (SUTURE) ×3
SUT SILK 3-0 18XBRD TIE 12 (SUTURE) IMPLANT
SUT VIC AB 3-0 SH 18 (SUTURE) ×5 IMPLANT
SYR BULB 3OZ (MISCELLANEOUS) ×3 IMPLANT
SYR CONTROL 10ML LL (SYRINGE) ×3 IMPLANT
TAPE CLOTH SURG 4X10 WHT LF (GAUZE/BANDAGES/DRESSINGS) ×2 IMPLANT
TUBE CONNECTING 12'X1/4 (SUCTIONS) ×2
TUBE CONNECTING 12X1/4 (SUCTIONS) ×3 IMPLANT

## 2017-04-17 NOTE — Interval H&P Note (Signed)
History and Physical Interval Note:  04/17/2017 11:32 AM  Shannon [name]  has presented today for surgery, with the diagnosis of primary hyperparathyroidism  The various methods of treatment have been discussed with the patient and family. After consideration of risks, benefits and other options for treatment, the patient has consented to  Procedure(s): NECK EXPLORATION WITH PARATHYROIDECTOMY (N/A) as a surgical intervention .  The patient's history has been reviewed, patient examined, no change in status, stable for surgery.  I have reviewed the patient's chart and labs.  Questions were answered to the patient's satisfaction.    Earnstine Regal, MD, [facility] Office: [place]

## 2017-04-17 NOTE — Anesthesia Postprocedure Evaluation (Signed)
Anesthesia Post Note  Patient: Shannon [name]  Procedure(s) Performed: Procedure(s) (LRB): NECK EXPLORATION WITH PARATHYROIDECTOMY (N/A)     Patient location during evaluation: PACU Anesthesia Type: General Level of consciousness: awake and alert Pain management: pain level controlled Vital Signs Assessment: post-procedure vital signs reviewed and stable Respiratory status: spontaneous breathing, nonlabored ventilation, respiratory function stable and patient connected to nasal cannula oxygen Cardiovascular status: blood pressure returned to baseline and stable Postop Assessment: no headache (some nausea and Rx'd) Anesthetic complications: no    Last Vitals:  Vitals:   04/17/17 1500 04/17/17 1522  BP: (!) 151/69 (!) 163/73  Pulse: (!) 56 (!) 50  Resp: 13 16  Temp: (!) 36.2 C     Last Pain:  Vitals:   04/17/17 1400  TempSrc:   PainSc: 7                  Kawena Lyday,JAMES TERRILL

## 2017-04-17 NOTE — H&P (Signed)
General Surgery [facility]  Shannon [name] DOB: [birth year] Married / Language: English / Race: White Female   History of Present Illness  The patient is a 66 year old female who presents with primary hyperparathyroidism.  CC: persistent primary hyperparathyroidism  Patient returns for follow-up at the request of her endocrinologist, Dr. Philemon Kingdom, with persistent primary hyperparathyroidism. Patient had undergone extensive evaluation including a 4D CT scan of the neck which demonstrated a right inferior parathyroid adenoma. On January 02, 2017 the patient underwent minimally invasive right inferior parathyroidectomy. A parathyroid adenoma measuring 2.5 cm in greatest dimension and weighing 1.292 g was removed. Postoperatively the patient did well. However her laboratory studies have remained elevated with a recent serum calcium of 11.1 and an intact PTH level of 118. Vitamin D level was normal at 32.31. Patient now returns to discuss further management.   Allergies Allergies Reconciled   Medication History Vitamin D3 (2000UNIT Tablet, Oral) Active. Co Q 10 (10MG  Capsule, Oral) Active. Magnesium (250MG  Tablet, Oral) Active. Vitamin B12 (100MCG Tablet, Oral) Active. Proventil HFA (108 (90 Base)MCG/ACT Aerosol Soln, Inhalation) Active. Tylenol Extra Strength (500MG  Tablet, Oral) Active. Medications Reconciled  Vitals Weight: 123 lb Height: 65in Body Surface Area: 1.61 m Body Mass Index: 20.47 kg/m  Temp.: 39F  Pulse: 61 (Regular)  BP: 120/70 (Sitting, Left Arm, Standard)   Physical Exam  The physical exam findings are as follows: Note:Limited examination  Right inferior anterior cervical incision is healed very nicely with an excellent cosmetic result. Palpation reveals no masses. There is no sign of seroma or infection. Voice quality is normal.    Assessment & Plan  PRIMARY HYPERPARATHYROIDISM (E21.0)  Follow Up -  Call [facility] office after tests / studies doneto discuss further plans  The patient and I reviewed her CT scan report that was performed prior to her surgery in March. There is no evidence on this study of a second gland adenoma.  I suspect that she does have a second gland adenoma. This occurs in approximately 3% of the population. I have recommended repeating a nuclear medicine parathyroid scan in hopes of identifying the adenoma and making her a candidate for minimally invasive surgery. If this fails to identify an adenoma, then I think we will be forced to proceed with neck exploration, identifying the remaining 3 glands, and performing parathyroidectomy. We discussed this today.  We will schedule her for nuclear medicine parathyroid scan. We will contact her with those results as soon as they are available.  ADDENDUM: Nuclear med parathyroid scan fails to reveal evidence of parathyroid adenoma.  Plan to proceed with neck exploration and parathyroidectomy.  Plan overnight observation after procedure and repeat calcium levels.  The risks and benefits of the procedure have been discussed at length with the patient.  The patient understands the proposed procedure, potential alternative treatments, and the course of recovery to be expected.  All of the patient's questions have been answered at this time.  The patient wishes to proceed with surgery.  Earnstine Regal, MD, [facility] Office: [phone]

## 2017-04-17 NOTE — Anesthesia Preprocedure Evaluation (Addendum)
Anesthesia Evaluation  Patient identified by MRN, date of birth, ID band Patient awake    Reviewed: Allergy & Precautions, NPO status , Patient's Chart, lab work & pertinent test results  History of Anesthesia Complications (+) PONV  Airway Mallampati: I  TM Distance: >3 FB Neck ROM: Full    Dental  (+) Teeth Intact, Dental Advisory Given   Pulmonary asthma , former smoker,    breath sounds clear to auscultation       Cardiovascular  Rhythm:Regular Rate:Normal     Neuro/Psych  Headaches,  Neuromuscular disease    GI/Hepatic   Endo/Other    Renal/GU      Musculoskeletal   Abdominal   Peds  Hematology   Anesthesia Other Findings   Reproductive/Obstetrics                           Anesthesia Physical Anesthesia Plan  ASA: II  Anesthesia Plan: General   Post-op Pain Management:    Induction: Intravenous  PONV Risk Score and Plan: 4 or greater and Ondansetron, Dexamethasone, Propofol, Midazolam, Scopolamine patch - Pre-op and Treatment may vary due to age or medical condition  Airway Management Planned: Oral ETT  Additional Equipment:   Intra-op Plan:   Post-operative Plan: Extubation in OR  Informed Consent: I have reviewed the patients History and Physical, chart, labs and discussed the procedure including the risks, benefits and alternatives for the proposed anesthesia with the patient or authorized representative who has indicated his/her understanding and acceptance.   Dental advisory given  Plan Discussed with:   Anesthesia Plan Comments:        Anesthesia Quick Evaluation

## 2017-04-17 NOTE — Brief Op Note (Signed)
04/17/2017  1:13 PM  PATIENT:  Shannon [name]  66 y.o. female  PRE-OPERATIVE DIAGNOSIS:  primary hyperparathyroidism  POST-OPERATIVE DIAGNOSIS:  primary hyperparathyroidism  PROCEDURE:  Procedure(s): NECK EXPLORATION WITH PARATHYROIDECTOMY (N/A)  SURGEON:  Surgeon(s) and Role:    * Armandina Gemma, MD - Primary  ASSISTANTS: Sharyn Dross, RNFA   ANESTHESIA:   general  EBL:  Total I/O In: 1000 [I.V.:1000] Out: 10 [Blood:10]  BLOOD ADMINISTERED:none  DRAINS: none   LOCAL MEDICATIONS USED:  NONE  SPECIMEN:  Excision  DISPOSITION OF SPECIMEN:  PATHOLOGY  COUNTS:  YES  TOURNIQUET:  * No tourniquets in log *  DICTATION: .Other Dictation: Dictation Number [id number]  PLAN OF CARE: Admit for overnight observation  PATIENT DISPOSITION:  PACU - hemodynamically stable.   Delay start of Pharmacological VTE agent (>24hrs) due to surgical blood loss or risk of bleeding: yes  Earnstine Regal, MD, [facility] Office: [phone]

## 2017-04-17 NOTE — Anesthesia Procedure Notes (Signed)
Procedure Name: Intubation Date/Time: 04/17/2017 11:54 AM Performed by: Garrison [place] T Pre-anesthesia Checklist: Patient identified, Emergency Drugs available, Suction available and Patient being monitored Patient Re-evaluated:Patient Re-evaluated prior to inductionOxygen Delivery Method: Circle System Utilized Preoxygenation: Pre-oxygenation with 100% oxygen Intubation Type: IV induction Ventilation: Mask ventilation without difficulty Laryngoscope Size: Mac and 3 Grade View: Grade I Tube type: Oral Tube size: 7.5 mm Number of attempts: 1 Airway Equipment and Method: Stylet and Oral airway Placement Confirmation: ETT inserted through vocal cords under direct vision,  positive ETCO2 and breath sounds checked- equal and bilateral Secured at: 22 cm Tube secured with: Tape Dental Injury: Teeth and Oropharynx as per pre-operative assessment

## 2017-04-17 NOTE — Transfer of Care (Signed)
Immediate Anesthesia Transfer of Care Note  Patient: Shannon [name]  Procedure(s) Performed: Procedure(s): NECK EXPLORATION WITH PARATHYROIDECTOMY (N/A)  Patient Location: PACU  Anesthesia Type:General  Level of Consciousness: awake, alert  and oriented  Airway & Oxygen Therapy: Patient Spontanous Breathing and Patient connected to nasal cannula oxygen  Post-op Assessment: Report given to RN, Post -op Vital signs reviewed and stable and Patient moving all extremities X 4  Post vital signs: Reviewed and stable  Last Vitals:  Vitals:   04/17/17 0937  BP: (!) 160/76  Pulse: 71  Resp: 20  Temp: 37 C    Last Pain:  Vitals:   04/17/17 0937  TempSrc: Oral      Patients Stated Pain Goal: 2 (36/64/40 3474)  Complications: No apparent anesthesia complications

## 2017-04-18 ENCOUNTER — Encounter (HOSPITAL_COMMUNITY): Payer: Self-pay | Admitting: Surgery

## 2017-04-18 DIAGNOSIS — Z87891 Personal history of nicotine dependence: Secondary | ICD-10-CM | POA: Diagnosis not present

## 2017-04-18 DIAGNOSIS — J45909 Unspecified asthma, uncomplicated: Secondary | ICD-10-CM | POA: Diagnosis not present

## 2017-04-18 DIAGNOSIS — E210 Primary hyperparathyroidism: Secondary | ICD-10-CM | POA: Diagnosis not present

## 2017-04-18 DIAGNOSIS — D367 Benign neoplasm of other specified sites: Secondary | ICD-10-CM | POA: Diagnosis not present

## 2017-04-18 DIAGNOSIS — D351 Benign neoplasm of parathyroid gland: Secondary | ICD-10-CM | POA: Diagnosis not present

## 2017-04-18 DIAGNOSIS — Z79899 Other long term (current) drug therapy: Secondary | ICD-10-CM | POA: Diagnosis not present

## 2017-04-18 LAB — BASIC METABOLIC PANEL
Anion gap: 8 (ref 5–15)
BUN: 9 mg/dL (ref 6–20)
CALCIUM: 8.8 mg/dL — AB (ref 8.9–10.3)
CO2: 27 mmol/L (ref 22–32)
CREATININE: 0.58 mg/dL (ref 0.44–1.00)
Chloride: 100 mmol/L — ABNORMAL LOW (ref 101–111)
GFR calc Af Amer: >60 mL/min (ref >60)
Glucose, Bld: 113 mg/dL — ABNORMAL HIGH (ref 65–99)
Potassium: 3.6 mmol/L (ref 3.5–5.1)
Sodium: 135 mmol/L (ref 135–145)

## 2017-04-18 MED ORDER — PROMETHAZINE HCL 25 MG/ML IJ SOLN
12.5000 mg | Freq: Four times a day (QID) | INTRAMUSCULAR | Status: DC | PRN
  Administered 2017-04-18: 12.5 mg via INTRAVENOUS
  Filled 2017-04-18: qty 1

## 2017-04-18 MED ORDER — HYDROCODONE-ACETAMINOPHEN 5-325 MG PO TABS: 1.0000 | ORAL_TABLET | ORAL | 0 refills | Status: DC | PRN

## 2017-04-18 NOTE — Progress Notes (Signed)
Patient still complains of nausea after 4 mg of IV Zofran given at 0443. Text paged Dr Catalina [facility] . Awaiting response.

## 2017-04-18 NOTE — Progress Notes (Deleted)
Physical Therapy Discharge Patient Details Name: Shannon [name] MRN: [id number] DOB: 1951/01/15 Today's Date: 04/18/2017 Time:  -     Patient discharged from inpatient services.   Progress and discharge plan discussed with patient and/or caregiver   Iv was removed and was intact.  Prescriptions were given to patient.   Amedeo Plenty 04/18/2017, 11:46 AM

## 2017-04-18 NOTE — Discharge Summary (Signed)
Physician Discharge Summary [facility]  Patient ID: Shannon [name] MRN: [id number] DOB/AGE: [birth year] 66 y.o.  Admit date: 04/17/2017 Discharge date: 04/18/2017  Admission Diagnoses:  Primary hyperparathyroidism  Discharge Diagnoses:  Active Problems:   Hyperparathyroidism ([place])   Primary hyperparathyroidism [facility])   Discharged Condition: good  Hospital Course: Patient was admitted for observation following parathyroid surgery.  Post op course was uncomplicated.  Pain was well controlled.  Tolerated diet.  Post op calcium level on morning following surgery was 8.8 mg/dl.  Patient was prepared for discharge home on POD#1.  Consults: None  Treatments: surgery: neck exploration and parathyroidectomy  Discharge Exam: Blood pressure (!) 116/55, pulse 60, temperature 97.7 F (36.5 C), temperature source Oral, resp. rate 16, height 5\' 5"  (1.651 m), weight 56.4 kg (124 lb 5.4 oz), SpO2 96 %. HEENT - clear Neck - wound dry and intact; steri-strips in place; mild STS; voice normal Chest - clear bilaterally Cor - RRR  Disposition: Home  Discharge Instructions    Apply dressing    Complete by:  As directed    Apply light gauze dressing to wound before discharge home today.   Diet - low sodium heart healthy    Complete by:  As directed    Discharge instructions    Complete by:  As directed    [facility]  THYROID & PARATHYROID SURGERY:  POST-OP INSTRUCTIONS  Always review your discharge instruction sheet from the facility where your surgery was performed.  A prescription for pain medication may be given to you upon discharge.  Take your pain medication as prescribed.  If narcotic pain medicine is not needed, then you may take acetaminophen (Tylenol) or ibuprofen (Advil) as needed.  Take your usually prescribed medications unless otherwise directed.  If you need a refill on your pain medication, please contact our office during regular business  hours.  Prescriptions will not be processed by our office after 5 pm or on weekends.  Start with a light diet upon arrival home, such as soup and crackers or toast.  Be sure to drink plenty of fluids daily.  Resume your normal diet the day after surgery.  Most patients will experience some swelling and bruising on the chest and neck area.  Ice packs will help.  Swelling and bruising can take several days to resolve.   It is common to experience some constipation after surgery.  Increasing fluid intake and taking a stool softener (Colace) will usually help or prevent this problem.  A mild laxative (Milk of Magnesia or Miralax) should be taken according to package directions if there has been no bowel movement after 48 hours.  You have steri-strips and a gauze dressing over your incision.  You may remove the gauze bandage on the second day after surgery, and you may shower at that time.  Leave your steri-strips (small skin tapes) in place directly over the incision.  These strips should remain on the skin for 5-7 days and then be removed.  You may get them wet in the shower and pat them dry.  You may resume regular (light) daily activities beginning the next day - such as daily self-care, walking, climbing stairs - gradually increasing activities as tolerated.  You may have sexual intercourse when it is comfortable.  Refrain from any heavy lifting or straining until approved by your doctor.  You may drive when you no longer are taking prescription pain medication, you can comfortably wear a seatbelt, and you  can safely maneuver your car and apply brakes.  You should see your doctor in the office for a follow-up appointment approximately three weeks after your surgery.  Make sure that you call for this appointment within a day or two after you arrive home to insure a convenient appointment time.  WHEN TO CALL YOUR DOCTOR: -- Fever greater than 101.5 -- Inability to urinate -- Nausea and/or vomiting -  persistent -- Extreme swelling or bruising -- Continued bleeding from incision -- Increased pain, redness, or drainage from the incision -- Difficulty swallowing or breathing -- Muscle cramping or spasms -- Numbness or tingling in hands or around lips  The clinic staff is available to answer your questions during regular business hours.  Please don't hesitate to call and ask to speak to one of the nurses if you have concerns.  Earnstine Regal, MD, [facility] Office: [phone]  Website: [email or web address]   Increase activity slowly    Complete by:  As directed    Remove dressing in 24 hours    Complete by:  As directed      Allergies as of 04/18/2017      Reactions   Asa [aspirin] Other (See Comments)   Minor bleeding      Medication List    TAKE these medications   albuterol 108 (90 Base) MCG/ACT inhaler Commonly known as:  PROVENTIL HFA;VENTOLIN HFA Inhale 2 puffs into the lungs every 6 (six) hours as needed for wheezing or shortness of breath.   Co Q-10 100 MG Caps Take 100 mg by mouth daily.   EQL VITAMIN D3 2000 units Caps Generic drug:  Cholecalciferol Take 2,000 Units by mouth daily.   HYDROcodone-acetaminophen 5-325 MG tablet Commonly known as:  NORCO/VICODIN Take 1-2 tablets by mouth every 4 (four) hours as needed for moderate pain.   Magnesium 250 MG Tabs Take 250 mg by mouth daily.   vitamin B-12 1000 MCG tablet Commonly known as:  CYANOCOBALAMIN Take 1,000 mcg by mouth daily.      Follow-up Information    Armandina Gemma, MD. Schedule an appointment as soon as possible for a visit in 3 week(s).   Specialty:  General Surgery Contact information: [address] [place] [address] [phone]           Earnstine Regal, MD, [facility] Office: [phone]   Signed: Earnstine Regal 04/18/2017, 8:20 AM

## 2017-04-18 NOTE — Progress Notes (Signed)
Pt was discharged from 6N. AVS discussed, IV's removed and prescriptions were given to patient. Pt left via wheelchair.

## 2017-04-18 NOTE — Care Management Note (Signed)
Case Management Note  Patient Details  Name: Shannon [name] MRN: [id number] Date of Birth: 08/10/1951  Subjective/Objective:           Patient in obs less than 24 hours, here for thyroidectomy. Independent pta from home w spouse. No CM consult, orders, HH or medication needs identified at time of DC.          Action/Plan:  DC to home self care.   Expected Discharge Date:  04/18/17               Expected Discharge Plan:  Home/Self Care  In-House Referral:     Discharge planning Services  CM Consult  Post Acute Care Choice:    Choice offered to:     DME Arranged:    DME Agency:     HH Arranged:    HH Agency:     Status of Service:  Completed, signed off  If discussed at [employer] of Stay Meetings, dates discussed:    Additional Comments:  Carles Collet, RN 04/18/2017, 10:23 AM

## 2017-04-18 NOTE — Op Note (Signed)
NAMEMarland Kitchen  NAYLAH, CORK NO.:  [id number]  MEDICAL RECORD NO.:  [id number]  LOCATION:                                 FACILITY:  PHYSICIAN:  Earnstine Regal, MD           DATE OF BIRTH:  DATE OF PROCEDURE:  04/17/2017                               OPERATIVE REPORT   PREOPERATIVE DIAGNOSIS:  Primary hyperparathyroidism.  POSTOPERATIVE DIAGNOSIS:  Primary hyperparathyroidism.  PROCEDURE:  Neck exploration with left parathyroidectomy.  SURGEON:  Earnstine Regal, MD  ANESTHESIA:  General.  ESTIMATED BLOOD LOSS:  Minimal.  PREPARATION:  ChloraPrep.  COMPLICATIONS:  None.  INDICATIONS:  The patient is a 66 year old female who had previously undergone right inferior minimally invasive parathyroidectomy for primary hyperparathyroidism.  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  Postoperatively, the patient had persistently elevated intact PTH levels and calcium levels.  Additional imaging was obtained, which failed to identify parathyroid adenoma.  The patient now comes to Surgery for neck exploration and parathyroidectomy.  BODY OF REPORT:  Procedure was done in OR #10 at the [place].  The patient was brought to the operating room and placed in supine position on the operating room table.  Following administration of general anesthesia, the patient is positioned and then prepped and draped in the usual aseptic fashion.  After ascertaining that an adequate level of anesthesia had been achieved, a Kocher incision was made using the previous incision and extending it to the left.  Dissections carried through subcutaneous tissues and platysma. External jugular veins are divided between hemostats and ligated with 3- 0 silk ties.  Skin flaps were elevated cephalad and caudad, and a Mahorner self-retaining retractor was placed for exposure.  Strap muscles were incised in the midline, dissection was begun on the left side.  Strap  muscles were reflected laterally exposing the left thyroid lobe.  Left lobe contains a soft 2-cm thyroid nodule, which has been previously biopsied and was benign.  Left lobe was mobilized. Exploration inferiorly fails to reveal an adenoma.  However, exploration posterior to the upper pole of the thyroid gland reveals a markedly enlarged parathyroid gland, which is multilobular.  It is gently dissected out.  This has the appearance of a parathyroid adenoma. Vascular pedicle was divided between small Ligaclips and the gland is excised in its entirety and submitted to Pathology.  Frozen section is performed by Dr. Enid Cutter.  This is a hypercellular parathyroid gland consistent with parathyroid adenoma.  Further dissection in the left neck fails to reveal any other enlarged parathyroid glands.  A second normal parathyroid gland is not identified.  Attempt is made to dissect the right side, but there is significant scar tissue between the strap muscles in the anterior surface of the right thyroid lobe.  Given the fact that two adenomas had been removed.  Also that the imaging study showed a signal on the left side, which was attributed to the thyroid nodule, but may have in fact been the parathyroid gland, which was located just posterior to the nodule, I decided to not pursue further neck dissection and preserve the remaining two  parathyroid glands.  Therefore, the neck was irrigated and good hemostasis was achieved throughout the operative field.  Fibrillar was placed throughout the operative field.  Strap muscles were reapproximated in the midline with interrupted 3-0 Vicryl sutures. Platysma was closed with interrupted 3-0 Vicryl suture.  Skin was closed with a running 4-0 Monocryl subcuticular suture.  Wound was washed and dried and Steri-Strips were applied.  Sterile dressings were applied. The patient was awakened from anesthesia and brought to the recovery room.  The patient  tolerated the procedure well.    Earnstine Regal, MD, [facility] Office: [phone]    TMG/MEDQ  D:  04/17/2017  T:  04/18/2017  Job:  [id number]

## 2017-05-09 DIAGNOSIS — E210 Primary hyperparathyroidism: Secondary | ICD-10-CM | POA: Diagnosis not present

## 2017-05-16 ENCOUNTER — Telehealth: Payer: Self-pay | Admitting: Internal Medicine

## 2017-05-16 NOTE — Telephone Encounter (Signed)
She just had parathyroid surgery less than a month ago. Please ask her to contact Dr. Gala Lewandowsky office to clarify her calcium replacement.

## 2017-05-16 NOTE — Telephone Encounter (Signed)
Patient called Dr.Gerkins office and he thought she should not be on calcium supplement at all other than what she gets in her food and wanted your opinion. Please advise.  Thank you!

## 2017-05-16 NOTE — Telephone Encounter (Signed)
Patient called in reference to wanting to know if she needs to be on a calcium supplement. Please call patient and advise. OK to leave message.

## 2017-05-16 NOTE — Telephone Encounter (Signed)
Agee, but make sure she is getting enough from diet: at least 1000 mg daily./

## 2017-05-16 NOTE — Telephone Encounter (Signed)
Please advise. Thank you

## 2017-05-17 ENCOUNTER — Telehealth: Payer: Self-pay

## 2017-05-17 NOTE — Telephone Encounter (Signed)
Called and notified patient. No other questions at this time.

## 2017-05-22 ENCOUNTER — Ambulatory Visit: Payer: Medicare Other | Admitting: Endocrinology

## 2017-06-05 ENCOUNTER — Encounter: Payer: Self-pay | Admitting: Internal Medicine

## 2017-06-05 ENCOUNTER — Ambulatory Visit (INDEPENDENT_AMBULATORY_CARE_PROVIDER_SITE_OTHER): Payer: Medicare Other | Admitting: Internal Medicine

## 2017-06-05 VITALS — BP 110/70 | HR 87 | Wt 121.0 lb

## 2017-06-05 DIAGNOSIS — M810 Age-related osteoporosis without current pathological fracture: Secondary | ICD-10-CM | POA: Diagnosis not present

## 2017-06-05 DIAGNOSIS — Z23 Encounter for immunization: Secondary | ICD-10-CM | POA: Diagnosis not present

## 2017-06-05 DIAGNOSIS — E213 Hyperparathyroidism, unspecified: Secondary | ICD-10-CM

## 2017-06-05 DIAGNOSIS — E042 Nontoxic multinodular goiter: Secondary | ICD-10-CM

## 2017-06-05 NOTE — Progress Notes (Signed)
Patient ID: Shannon [name], female   DOB: 07-20-1951, 66 y.o.   MRN: [id number]    HPI  Shannon [name] is a 66 y.o.-year-old female, initially referred by her PCP, Dr. Redmond School, returning for f/u for Primary hyperparathyroidism, OP, multi-nodular thyroid. Last visit 4 months ago.  Since last visit, patient had parathyroidectomy on 04/17/2017 due to persistent hypercalcemia and hyperparathyroidism after the first surgery >> a second parathyroid adenoma was resected. Parathyroid gland, Left - PARATHYROID ADENOMA.  Her calcium was 8.8 first day postop, then 04/27/2017: Ca 9.2, PTH was 61.  She feels great! No generalized pain or anxiety. She has some mm cramps, at rest.  Reviewed and addended history: 11/22/2016: Technetium sestamibi scan: 1. No evidence of ectopic parathyroid adenoma. 2. Asymmetric nodular activity in the left peritracheal region on the delayed images could reflect asymmetric thyroid activity or a left-sided parathyroid adenoma.  11/30/2016: Neck ultrasound: There are 2 thyroid nodules as described. - The left mid thyroid nodule measures 2.7 x 2.2 x 1.2 cm solid/almost completely solid (2), isoechoic (1), and meets fine-needle aspiration criteria.  - The other nodule measures 0.9 cm x 0.7 x 0.6 cm,  solid/almost completely solid (2), hyperechoic (1), taller-than-wide (3), but not meeting criteria for biopsy. - There are no obvious masses external to the thyroid gland to suggest parathyroid adenoma.  12/09/2016: 4D CT: 1. 13 mm mass posterior and inferior to the right thyroid lobe suspicious for parathyroid adenoma. 2. Additional subcentimeter soft tissue nodules more inferiorly in the right neck, indeterminate. These may reflect small lymph nodes, however an additional parathyroid adenoma is not excluded (particularly the most inferior nodule just above the thoracic inlet). 3. 2.2 cm left thyroid nodule as described on ultrasound.  12/14/2016: Thyroid nodule FNA:  benign  She had parathyroidectomy by Dr. Harlow Asa - 01/02/2017.  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  She initially felt well after the surgery, but then started to be fatigued again. Her calcium was still high after the Sx >> 01/19/2017: 11.9, while the PTH decreased nicely to 76 at follow up with Dr. Harlow Asa ~ 2 weeks postop.  However, PTH at last visit was high at 118 for a normal calcium in the presence of an elevated calcium  >>  she was referred for a intervention.  Reviewed and addended hx: Shannon [name] was dx with hypercalcemia at least in 2012. I reviewed Shannon [name]'s pertinent labs: Lab Results  Component Value Date   PTH 118 (H) 02/03/2017   PTH Comment 02/03/2017   PTH 127 (H) 10/07/2016   PTH 169 (H) 09/07/2016   CALCIUM 8.8 (L) 04/18/2017   CALCIUM 10.8 (H) 04/11/2017   CALCIUM 11.1 (H) 02/03/2017   CALCIUM 11.8 (H) 10/07/2016   CALCIUM 12.1 (H) 08/24/2016   CALCIUM 11.2 (H) 06/04/2012   CALCIUM 11.9 (H) 10/06/2011   Further labs confirmed primary hyperparathyroidism:  Calcium     8.6 - 10.4 mg/dL 11.8 (H)  GFR, Est Non African American     >=60 mL/min >89  Vitamin D 1, 25 (OH) Total     18 - 72 pg/mL 117 (H)  Vitamin D3 1, 25 (OH)     pg/mL 117  Vitamin D2 1, 25 (OH)     pg/mL <8  PTH     15 - 65 pg/mL 127 (H)  Magnesium     1.5 - 2.5 mg/dL 2.0  Phosphorus     2.3 - 4.6 mg/dL 2.3   Urinary calcium was also high: Component  Latest Ref Rng & Units 10/11/2016  Calcium, Ur     Not estab mg/dL 17  Calcium, 24 hour urine     35 - 250 mg/24 h 476 (H)  Creatinine, Urine     20 - 320 mg/dL 44  Creatinine, 24H Ur     0.63 - 2.50 g/24 h 1.23   Patient also has osteoporosis:  Reviewed latest DEXA scan reports from [facility]: Date L1-L4 T score FN T score  05/27/2016 -3.6 RFN -2.9 LFN -3.2   Prev: Date L1-L4 T score FN T score  05/17/2012  -3.1  RFN -2.7 LFN -3.0   She refused antiresorptive meds in the past.  She has a history of left thumb fracture in 2001  while playing tennis. She had several falls playing sports or missing a step >> no fxs.  No h/o kidney stones.  No h/o CKD. Last BUN/Cr: Lab Results  Component Value Date   BUN 9 04/18/2017   BUN 6 04/11/2017   CREATININE 0.58 04/18/2017   CREATININE 0.60 04/11/2017   No h/o vitamin D deficiency. Latest vit D normal: Lab Results  Component Value Date   VD25OH 32.31 02/03/2017   VD25OH 35 08/24/2016   VD25OH 31 06/04/2012   She takes vitamin D 2000 units daily.; she also eats green, leafy, vegetables. No dairy as son is allergic.   Shannon [name] was on calcium 1200 mg daily for years >> stopped in last year. She had weakness and pain in legs, HAs, and mm cramps >> better after stopping calcium.  Shannon [name] does not have a FH of hypercalcemia, pituitary tumors, thyroid cancer.   ROS: Constitutional: no weight gain/no weight loss, no fatigue, no subjective hyperthermia, no subjective hypothermia Eyes: no blurry vision, no xerophthalmia ENT: no sore throat, no nodules palpated in throat, no dysphagia, no odynophagia, no hoarseness Cardiovascular: no CP/no SOB/no palpitations/no leg swelling Respiratory: no cough/no SOB/no wheezing Gastrointestinal: no N/no V/no D/no C/no acid reflux Musculoskeletal: no muscle aches/no joint aches Skin: no rashes, no hair loss Neurological: no tremors/no numbness/no tingling/no dizziness  I reviewed Shannon [name]'s medications, allergies, PMH, social hx, family hx, and changes were documented in the history of present illness. Otherwise, unchanged from my initial visit note.  Past Medical History:  Diagnosis Date  . Allergy    RHINTIS  . Asthma   . Fibrocystic disease of breast   . Hemorrhoids   . Hx of colonic polyps   . Menopause   . Mitral valve prolapse   . Mitral valve prolapse   . PONV (postoperative nausea and vomiting)    Shannon [name] asked to be careful with her teeth, due to them being brittle from low calcium  . Primary hyperparathyroidism ([place])    right  . Spinal  headache    Past Surgical History:  Procedure Laterality Date  . APPENDECTOMY  1964  . COLONOSCOPY    . CYSTOSTOMY W/ BLADDER BIOPSY     Dr. Rosana Hoes  . HEMORRHOID SURGERY     Dr. Truitt Leep  . NECK EXPLORATION  04/17/2017   with parathyroidectomy  . PARATHYROIDECTOMY Right 01/02/2017   Procedure: RIGHT INFERIOR PARATHYROIDECTOMY;  Surgeon: Armandina Gemma, MD;  Location: [place];  Service: General;  Laterality: Right;  . PARATHYROIDECTOMY N/A 04/17/2017   Procedure: NECK EXPLORATION WITH PARATHYROIDECTOMY;  Surgeon: Armandina Gemma, MD;  Location: [place];  Service: General;  Laterality: N/A;  . THUMB FUSION  1997   Dr Maxie Better  . TONSILLECTOMY AND ADENOIDECTOMY    .  tummy tuck     Social History   Social History  . Marital status: Married    Spouse name: N/A  . Number of children: 2   Occupational History  . retired    Social History Main Topics  . Smoking status: Former Smoker    Packs/day: 0.25    Years: 10.00    Types: Cigarettes    Quit date: 06/04/1985  . Smokeless tobacco: Never Used  . Alcohol use 3.0 oz/week    5 Glasses of wine per week  . Drug use: No   Current Outpatient Prescriptions on File Prior to Visit  Medication Sig Dispense Refill  . albuterol (PROVENTIL HFA;VENTOLIN HFA) 108 (90 Base) MCG/ACT inhaler Inhale 2 puffs into the lungs every 6 (six) hours as needed for wheezing or shortness of breath. 1 Inhaler 1  . Cholecalciferol (EQL VITAMIN D3) 2000 units CAPS Take 2,000 Units by mouth daily.    . Coenzyme Q10 (CO Q-10) 100 MG CAPS Take 100 mg by mouth daily.    . Magnesium 250 MG TABS Take 250 mg by mouth daily.     . vitamin B-12 (CYANOCOBALAMIN) 1000 MCG tablet Take 1,000 mcg by mouth daily.     No current facility-administered medications on file prior to visit.    Allergies  Allergen Reactions  . Asa [Aspirin] Other (See Comments)    Minor bleeding   Family History  Problem Relation Age of Onset  . Cancer Father 69       Lung cancer  . Heart disease  Father   . Hyperlipidemia Father   . Heart disease Mother 60       Sudden death presumed MI  . Heart disease Brother 5       Presumed CHF  . Heart disease Brother    Significant FH of early death 2/2 AMIs in parents and brothers.  PE: BP 110/70 (BP Location: Left Arm, Patient Position: Sitting)   Pulse 87   Wt 121 lb (54.9 kg)   SpO2 98%   BMI 20.14 kg/m  Wt Readings from Last 3 Encounters:  06/05/17 121 lb (54.9 kg)  04/17/17 124 lb 5.4 oz (56.4 kg)  04/11/17 124 lb 8 oz (56.5 kg)   Constitutional: Normal weight, in NAD Eyes: PERRLA, EOMI, no exophthalmos ENT: moist mucous membranes, Cervical scar still slightly edematous, no masses felt in neck, no cervical lymphadenopathy Cardiovascular: RRR, No MRG Respiratory: CTA B Gastrointestinal: abdomen soft, NT, ND, BS+ Musculoskeletal: no deformities, strength intact in all 4 Skin: moist, warm, no rashes Neurological: no tremor with outstretched hands, DTR normal in all 4  Assessment: 1. Primary hyperparathyroidism  2. Thyroid nodules - new dx  3. OP  Plan: 1. Patient has had Several instances of elevated calcium, with the highest level being 12.1. A corresponding intact PTH level was also high, at 169. We performed several investigations that confirmed primary hyperparathyroidism(elevated calcium and PTH, elevated 1, 25 dihydroxy vitamin D, and also high 24 hour urine calcium; normal magnesium and phosphorus at the lower limit of normal) and I referred the patient to surgery, after the diagnosis was confirmed. She had a right parathyroid adenoma resected in 12/2016, however, unfortunately, her calcium and PTH remained elevated after this surgery. She had the second, left parathyroid adenoma resected in 04/2017 by Dr. Harlow Asa. Her calcium and PTH level decreased nicely after the surgery and she feels a great deal better compared to before the surgery she has more energy, she has less anxiety and  less generalized pain. She is very  happy with the result of her surgery.  - Her surgical scar is still slightly edematous, and she used a derma before and now uses scaraway patches - Reviewed latest vitamin D level from 4 months ago and this was normal. We will not repeat this today. - We will check a PTH and calcium at this visit - I will see her back in 1 yr  2. Thyroid nodules - The left thyroid nodule is large (2.7 cm), but we have a benign biopsy of this nodule - no neck compression symptoms - Will need to keep an eye on this nodule; no intervention needed for now   3. OP - 2017 DEXA scan >> worse - She refused anti-resorptive's and tries to manage this naturally. She increased exercise. - We plan to repeat this one year after her parathyroidectomy  Flu shot given today.  Office Visit on 06/05/2017  Component Date Value Ref Range Status  . Calcium 06/05/2017 10.0  8.7 - 10.3 mg/dL Final  . PTH 06/05/2017 33  15 - 65 pg/mL Final  . PTH Interp 06/05/2017 Comment   Final   Comment: Interpretation                 Intact PTH    Calcium                                 (pg/mL)      (mg/dL) Normal                          15 - 65     8.6 - 10.2 Primary Hyperparathyroidism         >65          >10.2 Secondary Hyperparathyroidism       >65          <10.2 Non-Parathyroid Hypercalcemia       <65          >10.2 Hypoparathyroidism                  <15          < 8.6 Non-Parathyroid Hypocalcemia    15 - 65          < 8.6    Normal labs!  Philemon Kingdom, MD PhD [facility]

## 2017-06-05 NOTE — Patient Instructions (Signed)
Please stop at the lab.  Please return in 1 year.  

## 2017-06-08 LAB — PTH, INTACT AND CALCIUM
Calcium: 10 mg/dL (ref 8.7–10.3)
PTH: 33 pg/mL (ref 15–65)

## 2018-01-10 ENCOUNTER — Other Ambulatory Visit: Payer: Self-pay | Admitting: Obstetrics and Gynecology

## 2018-01-10 DIAGNOSIS — Z1231 Encounter for screening mammogram for malignant neoplasm of breast: Secondary | ICD-10-CM

## 2018-02-01 ENCOUNTER — Other Ambulatory Visit: Payer: Self-pay | Admitting: Surgery

## 2018-02-01 DIAGNOSIS — E210 Primary hyperparathyroidism: Secondary | ICD-10-CM

## 2018-02-08 DIAGNOSIS — E210 Primary hyperparathyroidism: Secondary | ICD-10-CM | POA: Diagnosis not present

## 2018-02-08 DIAGNOSIS — E041 Nontoxic single thyroid nodule: Secondary | ICD-10-CM | POA: Diagnosis not present

## 2018-02-12 ENCOUNTER — Ambulatory Visit
Admission: RE | Admit: 2018-02-12 | Discharge: 2018-02-12 | Disposition: A | Discharge status: Home / Self Care | Payer: Medicare Other | Source: Ambulatory Visit | Attending: Surgery | Admitting: Surgery

## 2018-02-12 DIAGNOSIS — E210 Primary hyperparathyroidism: Secondary | ICD-10-CM

## 2018-02-12 DIAGNOSIS — E041 Nontoxic single thyroid nodule: Secondary | ICD-10-CM | POA: Diagnosis not present

## 2018-02-21 ENCOUNTER — Other Ambulatory Visit: Payer: Medicare Other

## 2018-03-02 ENCOUNTER — Encounter: Payer: Self-pay | Admitting: Internal Medicine

## 2018-03-02 ENCOUNTER — Ambulatory Visit (INDEPENDENT_AMBULATORY_CARE_PROVIDER_SITE_OTHER): Payer: Medicare Other | Admitting: Internal Medicine

## 2018-03-02 ENCOUNTER — Other Ambulatory Visit: Payer: Self-pay | Admitting: Internal Medicine

## 2018-03-02 VITALS — BP 120/78 | HR 83 | Ht 65.25 in | Wt 118.2 lb

## 2018-03-02 DIAGNOSIS — E042 Nontoxic multinodular goiter: Secondary | ICD-10-CM | POA: Diagnosis not present

## 2018-03-02 DIAGNOSIS — M810 Age-related osteoporosis without current pathological fracture: Secondary | ICD-10-CM

## 2018-03-02 DIAGNOSIS — E039 Hypothyroidism, unspecified: Secondary | ICD-10-CM

## 2018-03-02 DIAGNOSIS — E038 Other specified hypothyroidism: Secondary | ICD-10-CM

## 2018-03-02 DIAGNOSIS — E210 Primary hyperparathyroidism: Secondary | ICD-10-CM

## 2018-03-02 LAB — T4, FREE: FREE T4: 0.68 ng/dL (ref 0.60–1.60)

## 2018-03-02 LAB — T3, FREE: T3 FREE: 3.2 pg/mL (ref 2.3–4.2)

## 2018-03-02 LAB — VITAMIN D 25 HYDROXY (VIT D DEFICIENCY, FRACTURES): VITD: 41.58 ng/mL (ref 30.00–100.00)

## 2018-03-02 LAB — TSH: TSH: 6.57 u[IU]/mL — ABNORMAL HIGH (ref 0.35–4.50)

## 2018-03-02 NOTE — Progress Notes (Signed)
Patient ID: Shannon [name], female   DOB: [birth year], 67 y.o.   MRN: [id number]    HPI  Shannon [name] is a 67 y.o.-year-old female, initially referred by her PCP, Dr. Redmond School, returning for f/u for Primary hyperparathyroidism, OP, multi-nodular thyroid. Last visit  9 mo ago.  She had a surgery for L jaw cyst in 02/2018. This is now healing.  She is feeling great, running again.   Reviewed and addended history: Pt has had hypercalcemia at least since 2012.  I reviewed pt's pertinent labs: 02/01/2018: Calcium 9.4, PTH 41 -per checked by Dr. Harlow Asa Lab Results  Component Value Date   PTH 33 06/05/2017   PTH Comment 06/05/2017   PTH 118 (H) 02/03/2017   PTH Comment 02/03/2017   PTH 127 (H) 10/07/2016   PTH 169 (H) 09/07/2016   CALCIUM 10.0 06/05/2017   CALCIUM 8.8 (L) 04/18/2017   CALCIUM 10.8 (H) 04/11/2017   CALCIUM 11.1 (H) 02/03/2017   CALCIUM 11.8 (H) 10/07/2016   CALCIUM 12.1 (H) 08/24/2016   CALCIUM 11.2 (H) 06/04/2012   CALCIUM 11.9 (H) 10/06/2011   Further labs confirmed primary hyperparathyroidism:  Calcium     8.6 - 10.4 mg/dL 11.8 (H)  GFR, Est Non African American     >=60 mL/min >89  Vitamin D 1, 25 (OH) Total     18 - 72 pg/mL 117 (H)  Vitamin D3 1, 25 (OH)     pg/mL 117  Vitamin D2 1, 25 (OH)     pg/mL <8  PTH     15 - 65 pg/mL 127 (H)  Magnesium     1.5 - 2.5 mg/dL 2.0  Phosphorus     2.3 - 4.6 mg/dL 2.3   Urine calcium was high: Component     Latest Ref Rng & Units 10/11/2016  Calcium, Ur     Not estab mg/dL 17  Calcium, 24 hour urine     35 - 250 mg/24 h 476 (H)  Creatinine, Urine     20 - 320 mg/dL 44  Creatinine, 24H Ur     0.63 - 2.50 g/24 h 1.23   11/22/2016: Technetium sestamibi scan: 1. No evidence of ectopic parathyroid adenoma. 2. Asymmetric nodular activity in the left peritracheal region on the delayed images could reflect asymmetric thyroid activity or a left-sided parathyroid adenoma.  11/30/2016: Neck ultrasound: There are 2  thyroid nodules as described. - The left mid thyroid nodule measures 2.7 x 2.2 x 1.2 cm solid/almost completely solid (2), isoechoic (1), and meets fine-needle aspiration criteria.  - The other nodule measures 0.9 cm x 0.7 x 0.6 cm,  solid/almost completely solid (2), hyperechoic (1), taller-than-wide (3), but not meeting criteria for biopsy. - There are no obvious masses external to the thyroid gland to suggest parathyroid adenoma.  12/09/2016: 4D CT: 1. 13 mm mass posterior and inferior to the right thyroid lobe suspicious for parathyroid adenoma. 2. Additional subcentimeter soft tissue nodules more inferiorly in the right neck, indeterminate. These may reflect small lymph nodes, however an additional parathyroid adenoma is not excluded (particularly the most inferior nodule just above the thoracic inlet). 3. 2.2 cm left thyroid nodule as described on ultrasound.  12/14/2016: Thyroid nodule FNA: benign  She had parathyroidectomy by Dr. Harlow Asa - 01/02/2017.  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  She initially felt well after the surgery, but then started to be fatigued again.  Calcium remains high after the surgery: 11.9 (01/19/2017), while PTH decreased to 76 at follow-up  with Dr. Harlow Asa 2 weeks postop.  However, it then increased to 118.  She had a second intervention: Parathyroidectomy in 04/17/2017 >> a second parathyroid adenoma was resected: Parathyroid gland, Left - PARATHYROID ADENOMA.  Her calcium was 8.8 first day postop, then 04/27/2017: Ca 9.2, PTH was 61.  She felt great after the surgery, without complaints of muscle aches, generalized pain, anxiety.  She had a recent thyroid U/S with Dr. Harlow Asa: Thyroid U/S (02/12/2018): Left mid nodule 2 measures 2.4 x 2.3 x 1.4 cm and previously measured 2.7 x 2.2 x 1.2 cm. Biopsy was performed 12/14/2016. Right lobe nodule measures 0.8 cm and previously measured 0.9 cm. It does not meet criteria for biopsy nor follow-up. IMPRESSION: Left nodule  2 is stable and previously underwent biopsy on 12/14/2016.  Osteoporosis:  Reviewed latest [facility] scan reports [facility]: Date L1-L4 T score FN T score  05/27/2016 -3.6 RFN -2.9 LFN -3.2   Prev: Date L1-L4 T score FN T score  05/17/2012  -3.1  RFN -2.7 LFN -3.0    She continues to refuse antiresorptive medicines.  She has a history of left thumb fracture in 2001 while playing tennis. She had several falls playing sports or missing a step >> no fxs.  No history of kidney stones  No CKD. Last BUN/Cr: Lab Results  Component Value Date   BUN 9 04/18/2017   BUN 6 04/11/2017   CREATININE 0.58 04/18/2017   CREATININE 0.60 04/11/2017   No vitamin D deficiency.  Latest vitamin D was normal: Lab Results  Component Value Date   VD25OH 32.31 02/03/2017   VD25OH 35 08/24/2016   VD25OH 31 06/04/2012   She is on vitamin D supplement 2000 units daily. No dairy as son is allergic.   Pt does not have a FH of hypercalcemia, pituitary tumors, thyroid cancer.   She continues to do intermittent fasting.  She is mostly vegetarian.  ROS: Constitutional: no weight gain/no weight loss, no fatigue, no subjective hyperthermia, no subjective hypothermia Eyes: no blurry vision, no xerophthalmia ENT: no sore throat, no nodules palpated in throat, no dysphagia, no odynophagia, no hoarseness Cardiovascular: no CP/no SOB/no palpitations/no leg swelling Respiratory: no cough/no SOB/no wheezing Gastrointestinal: no N/no V/no D/no C/no acid reflux Musculoskeletal: no muscle aches/no joint aches Skin: no rashes, no hair loss now, this improved in the last few days Neurological: no tremors/no numbness/no tingling/no dizziness  I reviewed pt's medications, allergies, PMH, social hx, family hx, and changes were documented in the history of present illness. Otherwise, unchanged from my initial visit note.  Past Medical History:  Diagnosis Date  . Allergy    RHINTIS  . Asthma   .  Fibrocystic disease of breast   . Hemorrhoids   . Hx of colonic polyps   . Menopause   . Mitral valve prolapse   . Mitral valve prolapse   . PONV (postoperative nausea and vomiting)    Pt asked to be careful with her teeth, due to them being brittle from low calcium  . Primary hyperparathyroidism ([place])    right  . Spinal headache    Past Surgical History:  Procedure Laterality Date  . APPENDECTOMY  1964  . COLONOSCOPY    . CYSTOSTOMY W/ BLADDER BIOPSY     Dr. Rosana Hoes  . HEMORRHOID SURGERY     Dr. Truitt Leep  . NECK EXPLORATION  04/17/2017   with parathyroidectomy  . PARATHYROIDECTOMY Right 01/02/2017   Procedure: RIGHT INFERIOR PARATHYROIDECTOMY;  Surgeon: Armandina Gemma,  MD;  Location: [place];  Service: General;  Laterality: Right;  . PARATHYROIDECTOMY N/A 04/17/2017   Procedure: NECK EXPLORATION WITH PARATHYROIDECTOMY;  Surgeon: Armandina Gemma, MD;  Location: [place];  Service: General;  Laterality: N/A;  . THUMB FUSION  1997   Dr Maxie Better  . TONSILLECTOMY AND ADENOIDECTOMY    . tummy tuck     Social History   Social History  . Marital status: Married    Spouse name: N/A  . Number of children: 2   Occupational History  . retired    Social History Main Topics  . Smoking status: Former Smoker    Packs/day: 0.25    Years: 10.00    Types: Cigarettes    Quit date: 06/04/1985  . Smokeless tobacco: Never Used  . Alcohol use 3.0 oz/week    5 Glasses of wine per week  . Drug use: No   Current Outpatient Medications on File Prior to Visit  Medication Sig Dispense Refill  . albuterol (PROVENTIL HFA;VENTOLIN HFA) 108 (90 Base) MCG/ACT inhaler Inhale 2 puffs into the lungs every 6 (six) hours as needed for wheezing or shortness of breath. 1 Inhaler 1  . Cholecalciferol (EQL VITAMIN D3) 2000 units CAPS Take 2,000 Units by mouth daily.    . Coenzyme Q10 (CO Q-10) 100 MG CAPS Take 100 mg by mouth daily.    . Magnesium 250 MG TABS Take 250 mg by mouth daily.     . vitamin B-12  (CYANOCOBALAMIN) 1000 MCG tablet Take 1,000 mcg by mouth daily.     No current facility-administered medications on file prior to visit.    Allergies  Allergen Reactions  . Asa [Aspirin] Other (See Comments)    Minor bleeding   Family History  Problem Relation Age of Onset  . Cancer Father 36       Lung cancer  . Heart disease Father   . Hyperlipidemia Father   . Heart disease Mother 67       Sudden death presumed MI  . Heart disease Brother 35       Presumed CHF  . Heart disease Brother    Significant FH of early death 2/2 AMIs in parents and brothers.  PE: There were no vitals taken for this visit. Wt Readings from Last 3 Encounters:  06/05/17 121 lb (54.9 kg)  04/17/17 124 lb 5.4 oz (56.4 kg)  04/11/17 124 lb 8 oz (56.5 kg)   Constitutional: normal weight, in NAD Eyes: PERRLA, EOMI, no exophthalmos ENT: moist mucous membranes, thyroidectomy scar healed, no masses felt in the neck, no cervical lymphadenopathy Cardiovascular: RRR, No MRG Respiratory: CTA B Gastrointestinal: abdomen soft, NT, ND, BS+ Musculoskeletal: no deformities, strength intact in all 4 Skin: moist, warm, no rashes Neurological: no tremor with outstretched hands, DTR normal in all 4  Assessment: 1. Primary hyperparathyroidism  2. Thyroid nodules - new dx  3. OP  Plan: 1. Patient with history of primary hyperparathyroidism, with his calcium level being 12.1.  The highest PTH level was 169.  Several investigations confirmed primary hyperparathyroidism and she was referred to surgery.  She had a right parathyroid adenoma resected the 12/2016, however, unfortunately, her calcium and PTH remained high after the surgery.  She had a second, left parathyroid adenoma resected in 04/2017 by Dr. Harlow Asa.  After the surgery, her calcium and PTH level decreased and she started to feel great in comparison to before the surgery.  She had more energy, less anxiety, and less generalized pain.  Her surgical scar is  now healed.  At last visit, it was slightly edematous and she was using scaraway patches - At this visit, we will check her vitamin D. Reviewed calcium and PTH level from her most recent visit with Dr. Harlow Asa approximately a month ago and these were all normal - she continues on 2000 units vitamin D daily. - I will see her back in a year  2. Thyroid nodules -No neck compression symptoms -Reviewed her most recent thyroid ultrasound from earlier this month: The dominant nodule is still large, but slightly smaller and without changed ultrasound characteristics -The left thyroid nodule is fairly large, 2.7 cm, but the biopsy was benign -We will continue to keep an eye on this nodule  3. OP  - reviewed the reports of the latest DXA scans >> T-scored from 2017 were worse - She refuses antiresorptive medicines and tries to manage this naturally.  She continues to exercise. - She is due for another DXA scan in 3 months, but we decided to do this next year, 2 years after her last parathyroid surgery.  I advised her to ask her OB/GYN doctor to order this on the same machine  Received labs from Dr. Harlow Asa after the visit: 02/08/2018: TSH 8.98  Office Visit on 03/02/2018  Component Date Value Ref Range Status  . TSH 03/02/2018 6.57* 0.35 - 4.50 uIU/mL Final  . Free T4 03/02/2018 0.68  0.60 - 1.60 ng/dL Final   Comment: Specimens from patients who are undergoing biotin therapy and /or ingesting biotin supplements may contain high levels of biotin.  The higher biotin concentration in these specimens interferes with this Free T4 assay.  Specimens that contain high levels  of biotin may cause false high results for this Free T4 assay.  Please interpret results in light of the total clinical presentation of the patient.    . T3, Free 03/02/2018 3.2  2.3 - 4.2 pg/mL Final  . VITD 03/02/2018 41.58  30.00 - 100.00 ng/mL Final   Dear Ms. Munar, The results are back and the vitamin D level is normal. The TSH  is slightly high, but the rest of the thyroid hormones are normal.  This qualifies as mild (subclinical) hypothyroidism.  It is in fact so mild that it does not necessarily need treatment for now.  I would suggest to have you come back in 6 months for a recheck. I also received the records from Dr. Harlow Asa and [facility] was slightly higher at that time, at 8.98.  At today's visit, it looks like the level has improved.  This is good news. So please call our office to schedule an appointment in 6 months for thyroid tests. Sincerely, Philemon Kingdom MD  Philemon Kingdom, MD PhD [facility]

## 2018-03-02 NOTE — Patient Instructions (Signed)
Continue vitamin D 2000 units daily.  Please stop at the lab.  Please come back for a follow-up appointment in 1 year.

## 2018-03-07 ENCOUNTER — Other Ambulatory Visit: Payer: Medicare Other

## 2018-03-28 ENCOUNTER — Telehealth: Payer: Self-pay

## 2018-03-28 NOTE — Telephone Encounter (Signed)
Called and lvm for pt to make an appt or awv. [place]

## 2018-05-30 DIAGNOSIS — Z01419 Encounter for gynecological examination (general) (routine) without abnormal findings: Secondary | ICD-10-CM | POA: Diagnosis not present

## 2018-05-30 DIAGNOSIS — Z681 Body mass index (BMI) 19 or less, adult: Secondary | ICD-10-CM | POA: Diagnosis not present

## 2018-05-30 DIAGNOSIS — Z1231 Encounter for screening mammogram for malignant neoplasm of breast: Secondary | ICD-10-CM | POA: Diagnosis not present

## 2018-05-30 DIAGNOSIS — Z124 Encounter for screening for malignant neoplasm of cervix: Secondary | ICD-10-CM | POA: Diagnosis not present

## 2018-05-31 ENCOUNTER — Ambulatory Visit (INDEPENDENT_AMBULATORY_CARE_PROVIDER_SITE_OTHER): Payer: Medicare Other | Admitting: Family Medicine

## 2018-05-31 ENCOUNTER — Encounter: Payer: Self-pay | Admitting: Family Medicine

## 2018-05-31 VITALS — BP 120/80 | HR 74 | Temp 98.2°F | Wt 118.4 lb

## 2018-05-31 DIAGNOSIS — M810 Age-related osteoporosis without current pathological fracture: Secondary | ICD-10-CM

## 2018-05-31 DIAGNOSIS — E210 Primary hyperparathyroidism: Secondary | ICD-10-CM | POA: Diagnosis not present

## 2018-05-31 DIAGNOSIS — Z9889 Other specified postprocedural states: Secondary | ICD-10-CM | POA: Diagnosis not present

## 2018-05-31 DIAGNOSIS — Z638 Other specified problems related to primary support group: Secondary | ICD-10-CM | POA: Diagnosis not present

## 2018-05-31 DIAGNOSIS — J4520 Mild intermittent asthma, uncomplicated: Secondary | ICD-10-CM | POA: Diagnosis not present

## 2018-05-31 DIAGNOSIS — Z13220 Encounter for screening for lipoid disorders: Secondary | ICD-10-CM | POA: Diagnosis not present

## 2018-05-31 DIAGNOSIS — J0100 Acute maxillary sinusitis, unspecified: Secondary | ICD-10-CM | POA: Diagnosis not present

## 2018-05-31 DIAGNOSIS — E892 Postprocedural hypoparathyroidism: Secondary | ICD-10-CM

## 2018-05-31 DIAGNOSIS — Z1211 Encounter for screening for malignant neoplasm of colon: Secondary | ICD-10-CM | POA: Diagnosis not present

## 2018-05-31 DIAGNOSIS — Z9009 Acquired absence of other part of head and neck: Secondary | ICD-10-CM | POA: Insufficient documentation

## 2018-05-31 MED ORDER — AMOXICILLIN 875 MG PO TABS: 875.0000 mg | ORAL_TABLET | Freq: Two times a day (BID) | ORAL | 0 refills | Status: DC

## 2018-05-31 MED ORDER — ALBUTEROL SULFATE HFA 108 (90 BASE) MCG/ACT IN AERS: 2.0000 | INHALATION_SPRAY | Freq: Four times a day (QID) | RESPIRATORY_TRACT | 1 refills | Status: DC | PRN

## 2018-05-31 NOTE — Patient Instructions (Signed)
Call if you are not entirely back to normal when you finish the antibiotic.

## 2018-05-31 NOTE — Progress Notes (Signed)
   Subjective:    Patient ID: Shannon [name], female    DOB: 05-Mar-1951, 67 y.o.   MRN: [id number]  HPI She has a 6-week history of difficulty with sinus congestion, dry cough, PND with rhinorrhea, frontal headache as well as bilateral earache.  No fever or chills.  She saw her dentist recently and apparently the x-rays do show some sinus problems.  She is also had recent parathyroid surgery and is being followed by Dr. Harlow Asa.  She also sees her endocrinologist regularly.  Her asthma seems to be under good control.  Review of the record indicates she needs a colonoscopy.  She is being watched closely because of her underlying calcium issues and osteoporosis. She is also under stress dealing with her son's pending marriage.  Apparently the future daughter-in-law is still very much attached to the mother which is causing some potential strife with her son.  Review of Systems     Objective:   Physical Exam Alert and in no distress.  Nasal mucosa is red with tenderness over maxillary sinuses.  Tympanic membranes and canals are normal. Pharyngeal area is normal. Neck is supple without adenopathy or thyromegaly. Cardiac exam shows a regular sinus rhythm without murmurs or gallops. Lungs are clear to auscultation.        Assessment & Plan:  Mild intermittent asthma, unspecified whether complicated - Plan: albuterol (PROVENTIL HFA;VENTOLIN HFA) 108 (90 Base) MCG/ACT inhaler  Age-related osteoporosis without current pathological fracture - Plan: CBC with Differential/Platelet, Comprehensive metabolic panel  H/O parathyroidectomy  Primary hyperparathyroidism (HCC)  Acute non-recurrent maxillary sinusitis - Plan: amoxicillin (AMOXIL) 875 MG tablet, CBC with Differential/Platelet, Comprehensive metabolic panel  Screening for colon cancer - Plan: Cologuard  Screening for lipid disorders - Plan: Lipid panel  Stress due to family tension She will continue to be followed by her surgeon and  endocrinologist.  We will set her up for Cologuard and check her lipid panel.  She is to call if not any better with the Amoxil. Discussed the problem that her son is having and strongly encouraged him and his future wife get involved in counseling as this issue can certainly linger and cause trouble later.

## 2018-06-01 LAB — COMPREHENSIVE METABOLIC PANEL
ALT: 13 IU/L (ref 0–32)
AST: 20 IU/L (ref 0–40)
Albumin/Globulin Ratio: 2.1 (ref 1.2–2.2)
Albumin: 4.8 g/dL (ref 3.6–4.8)
Alkaline Phosphatase: 57 IU/L (ref 39–117)
BUN / CREAT RATIO: 12 (ref 12–28)
BUN: 8 mg/dL (ref 8–27)
Bilirubin Total: 0.5 mg/dL (ref 0.0–1.2)
CALCIUM: 9.5 mg/dL (ref 8.7–10.3)
CO2: 22 mmol/L (ref 20–29)
CREATININE: 0.67 mg/dL (ref 0.57–1.00)
Chloride: 101 mmol/L (ref 96–106)
GFR calc Af Amer: 106 mL/min/{1.73_m2} (ref >59)
GFR, EST NON AFRICAN AMERICAN: 92 mL/min/{1.73_m2} (ref >59)
GLOBULIN, TOTAL: 2.3 g/dL (ref 1.5–4.5)
Glucose: 90 mg/dL (ref 65–99)
Potassium: 5.5 mmol/L — ABNORMAL HIGH (ref 3.5–5.2)
SODIUM: 142 mmol/L (ref 134–144)
TOTAL PROTEIN: 7.1 g/dL (ref 6.0–8.5)

## 2018-06-01 LAB — CBC WITH DIFFERENTIAL/PLATELET
Basophils Absolute: 0 10*3/uL (ref 0.0–0.2)
Basos: 1 %
EOS (ABSOLUTE): 0.1 10*3/uL (ref 0.0–0.4)
EOS: 2 %
HEMATOCRIT: 41.9 % (ref 34.0–46.6)
Hemoglobin: 13.9 g/dL (ref 11.1–15.9)
IMMATURE GRANS (ABS): 0 10*3/uL (ref 0.0–0.1)
Immature Granulocytes: 0 %
LYMPHS ABS: 2.2 10*3/uL (ref 0.7–3.1)
Lymphs: 44 %
MCH: 32.3 pg (ref 26.6–33.0)
MCHC: 33.2 g/dL (ref 31.5–35.7)
MCV: 97 fL (ref 79–97)
MONOCYTES: 9 %
Monocytes Absolute: 0.5 10*3/uL (ref 0.1–0.9)
NEUTROS PCT: 44 %
Neutrophils Absolute: 2.2 10*3/uL (ref 1.4–7.0)
Platelets: 315 10*3/uL (ref 150–450)
RBC: 4.3 x10E6/uL (ref 3.77–5.28)
RDW: 13.9 % (ref 12.3–15.4)
WBC: 5 10*3/uL (ref 3.4–10.8)

## 2018-06-01 LAB — LIPID PANEL
CHOL/HDL RATIO: 2.4 ratio (ref 0.0–4.4)
Cholesterol, Total: 196 mg/dL (ref 100–199)
HDL: 83 mg/dL (ref >39)
LDL CALC: 97 mg/dL (ref 0–99)
TRIGLYCERIDES: 80 mg/dL (ref 0–149)
VLDL Cholesterol Cal: 16 mg/dL (ref 5–40)

## 2018-06-05 ENCOUNTER — Ambulatory Visit: Payer: Medicare Other | Admitting: Internal Medicine

## 2018-06-12 ENCOUNTER — Telehealth: Payer: Self-pay | Admitting: Family Medicine

## 2018-06-12 MED ORDER — AMOXICILLIN-POT CLAVULANATE 875-125 MG PO TABS: 1.0000 | ORAL_TABLET | Freq: Two times a day (BID) | ORAL | 0 refills | Status: DC

## 2018-06-12 NOTE — Telephone Encounter (Signed)
Pt called, she has finished 10 days of antibiotic, she is not better, can't tell it is working.  Please call in 2nd round and possibly something different to [employer].  Advised pt if not better after next round that you will want to see her.

## 2018-06-26 ENCOUNTER — Encounter: Payer: Self-pay | Admitting: Family Medicine

## 2018-06-26 ENCOUNTER — Ambulatory Visit (INDEPENDENT_AMBULATORY_CARE_PROVIDER_SITE_OTHER): Payer: Medicare Other | Admitting: Family Medicine

## 2018-06-26 VITALS — BP 130/80 | HR 70 | Temp 98.0°F | Wt 117.6 lb

## 2018-06-26 DIAGNOSIS — Z23 Encounter for immunization: Secondary | ICD-10-CM

## 2018-06-26 DIAGNOSIS — J0100 Acute maxillary sinusitis, unspecified: Secondary | ICD-10-CM

## 2018-06-26 MED ORDER — AMOXICILLIN-POT CLAVULANATE 875-125 MG PO TABS: 1.0000 | ORAL_TABLET | Freq: Two times a day (BID) | ORAL | 0 refills | Status: DC

## 2018-06-26 NOTE — Progress Notes (Signed)
   Subjective:    Patient ID: Shannon [name], female    DOB: 15-Mar-1951, 67 y.o.   MRN: [id number]  HPI She is here for recheck.  She states that she is roughly 75% better but still having difficulty with smell and taste specifically on the right side.   Review of Systems     Objective:   Physical Exam Alert and in no distress. Tympanic membranes and canals are normal. Pharyngeal area is normal. Neck is supple without adenopathy or thyromegaly. Cardiac exam shows a regular sinus rhythm without murmurs or gallops. Lungs are clear to auscultation. Nasal mucosa is normal.  Slight tenderness in the right maxillary sinus       Assessment & Plan:  Need for influenza vaccination - Plan: Flu Vaccine QUAD 6+ mos PF IM (Fluarix Quad PF)  Acute non-recurrent maxillary sinusitis - Plan: amoxicillin-clavulanate (AUGMENTIN) 875-125 MG tablet I will treat her for another 10 days and have her also use nasal steroid spray.  She will call if further difficulty.  Discussed the possibility of giving her another prescription for Augmentin.

## 2018-07-20 ENCOUNTER — Telehealth: Payer: Self-pay | Admitting: Family Medicine

## 2018-07-20 NOTE — Telephone Encounter (Signed)
Pt called to asked about cologuard. She states she was to received info on it and has not. Please advise pt at [phone].

## 2018-07-20 NOTE — Telephone Encounter (Signed)
Called pt back and was advised that she had not  Gotten the cologuard kit . Called [employer] and they have advised me that order was not received . Will resend order. Erma

## 2018-07-24 ENCOUNTER — Encounter: Payer: Self-pay | Admitting: Family Medicine

## 2018-09-04 ENCOUNTER — Telehealth: Payer: Self-pay | Admitting: Family Medicine

## 2018-09-04 ENCOUNTER — Other Ambulatory Visit: Payer: Self-pay

## 2018-09-04 DIAGNOSIS — Z1211 Encounter for screening for malignant neoplasm of colon: Secondary | ICD-10-CM

## 2018-09-04 NOTE — Telephone Encounter (Signed)
[employer] number was provided for pt to call about her kit she has not gotten. [place]

## 2018-09-04 NOTE — Telephone Encounter (Signed)
  Please call patient  She called stating that she still has not received colo guard

## 2018-10-15 ENCOUNTER — Encounter: Payer: Self-pay | Admitting: Family Medicine

## 2018-10-15 ENCOUNTER — Ambulatory Visit (INDEPENDENT_AMBULATORY_CARE_PROVIDER_SITE_OTHER): Payer: Medicare Other | Admitting: Family Medicine

## 2018-10-15 VITALS — BP 122/78 | HR 102 | Temp 98.3°F | Wt 120.8 lb

## 2018-10-15 DIAGNOSIS — J0101 Acute recurrent maxillary sinusitis: Secondary | ICD-10-CM

## 2018-10-15 DIAGNOSIS — Z1211 Encounter for screening for malignant neoplasm of colon: Secondary | ICD-10-CM | POA: Diagnosis not present

## 2018-10-15 DIAGNOSIS — J0100 Acute maxillary sinusitis, unspecified: Secondary | ICD-10-CM | POA: Diagnosis not present

## 2018-10-15 NOTE — Progress Notes (Addendum)
   Subjective:    Patient ID: Shannon [name], female    DOB: 1951/05/27, 68 y.o.   MRN: [id number]  HPI She is here for consult concerning recurrent sinus infections as well as continued difficulty with dental problems.  She was seen in September and treated for a sinus infection with Augmentin.  This needed to be repeated.  Also over this timeframe she has had a lot of dental problems requiring intermittent use of clindamycin on several occasions.  She is apparently had to have multiple crowns put in due to infections.  He was also given oral steroids as well as steroids to rinse her mouth out during that same timeframe.  So far she has not had any difficulty with diarrhea.  Over the last 3 days she has noted right earache, pain behind the right eye, upper tooth discomfort on the right as well as slight epistaxis again on the right.   Review of Systems     Objective:   Physical Exam Alert and in no distress.  Nasal mucosa is normal.  Tender over right maxillary sinus tympanic membranes and canals are normal. Pharyngeal area is normal. Neck is supple without adenopathy or thyromegaly. Cardiac exam shows a regular sinus rhythm without murmurs or gallops. Lungs are clear to auscultation.        Assessment & Plan:  Acute recurrent maxillary sinusitis - Plan: CT MAXILLOFACIAL LTD WO CM Since she has had so much difficulty with tooth discomfort as well as with her sinuses, I think a CT scan is appropriate before any more intervention is done.  The x-ray does show mucosal thickening indicating probable sinus infection.  I will place her on Augmentin and a Sterapred Dosepak.

## 2018-10-16 LAB — COLOGUARD: Cologuard: NEGATIVE

## 2018-10-17 ENCOUNTER — Ambulatory Visit
Admission: RE | Admit: 2018-10-17 | Discharge: 2018-10-17 | Disposition: A | Discharge status: Home / Self Care | Payer: Medicare Other | Source: Ambulatory Visit | Attending: Family Medicine | Admitting: Family Medicine

## 2018-10-17 DIAGNOSIS — J0101 Acute recurrent maxillary sinusitis: Secondary | ICD-10-CM

## 2018-10-17 DIAGNOSIS — J329 Chronic sinusitis, unspecified: Secondary | ICD-10-CM | POA: Diagnosis not present

## 2018-10-18 MED ORDER — AMOXICILLIN-POT CLAVULANATE 875-125 MG PO TABS: 1.0000 | ORAL_TABLET | Freq: Two times a day (BID) | ORAL | 0 refills | Status: DC

## 2018-10-18 MED ORDER — PREDNISONE 10 MG (48) PO TBPK: ORAL_TABLET | ORAL | 0 refills | Status: DC

## 2018-10-18 NOTE — Addendum Note (Signed)
Addended by: [name] on: 10/18/2018 08:26 AM   Modules accepted: Orders

## 2018-10-25 ENCOUNTER — Telehealth: Payer: Self-pay

## 2018-10-25 DIAGNOSIS — J0100 Acute maxillary sinusitis, unspecified: Secondary | ICD-10-CM

## 2018-10-25 NOTE — Telephone Encounter (Signed)
Pt called back and advise that she is feeling better but still has something going on the right side of face. She says you told her to call because  abx might need to be refilled. Please advise [facility]

## 2018-10-26 MED ORDER — AMOXICILLIN-POT CLAVULANATE 875-125 MG PO TABS: 1.0000 | ORAL_TABLET | Freq: Two times a day (BID) | ORAL | 0 refills | Status: DC

## 2018-10-26 NOTE — Telephone Encounter (Signed)
Pt informed

## 2018-10-26 NOTE — Telephone Encounter (Signed)
Let her know that I called the medication in. 

## 2018-11-05 ENCOUNTER — Ambulatory Visit (INDEPENDENT_AMBULATORY_CARE_PROVIDER_SITE_OTHER): Payer: Medicare Other | Admitting: Family Medicine

## 2018-11-05 ENCOUNTER — Encounter: Payer: Self-pay | Admitting: Family Medicine

## 2018-11-05 VITALS — BP 120/80 | HR 72 | Temp 98.2°F | Wt 124.0 lb

## 2018-11-05 DIAGNOSIS — G9001 Carotid sinus syncope: Secondary | ICD-10-CM | POA: Diagnosis not present

## 2018-11-05 DIAGNOSIS — J0101 Acute recurrent maxillary sinusitis: Secondary | ICD-10-CM

## 2018-11-05 NOTE — Progress Notes (Signed)
   Subjective:    Patient ID: Shannon [name], female    DOB: 14-Jan-1951, 68 y.o.   MRN: [id number]  HPI She is here for a recheck.  She is about finished taking the antibiotic and in general is feeling better.  Recently she saw her dentist for continued dental problems.  After the dental visit she did note a worsening of her right eye, ear and neck discomfort.  She was seen on the 21st for that.  Since then the symptoms have slowly gone away.   Review of Systems     Objective:   Physical Exam Alert and in no distress.  TMs are normal.  Neck is supple without adenopathy or thyromegaly.  She does have tenderness over the carotid notch.       Assessment & Plan:  Carotidynia  Acute recurrent maxillary sinusitis I explained that since she seems to be getting better in general, to not fix what is broken.  Did recommend Tylenol for her carotid pain.  Explained to her that that is nothing dangerous and there is no evidence of lymphadenopathy.  She will keep me informed.  If her symptoms worsen we might need to do further evaluation including scanning again.

## 2019-02-26 ENCOUNTER — Other Ambulatory Visit: Payer: Self-pay

## 2019-03-05 ENCOUNTER — Other Ambulatory Visit: Payer: Self-pay

## 2019-03-05 ENCOUNTER — Encounter: Payer: Self-pay | Admitting: Internal Medicine

## 2019-03-05 ENCOUNTER — Ambulatory Visit (INDEPENDENT_AMBULATORY_CARE_PROVIDER_SITE_OTHER): Payer: Medicare Other | Admitting: Internal Medicine

## 2019-03-05 VITALS — BP 158/60 | HR 90 | Ht 64.5 in | Wt 126.0 lb

## 2019-03-05 DIAGNOSIS — E039 Hypothyroidism, unspecified: Secondary | ICD-10-CM | POA: Diagnosis not present

## 2019-03-05 DIAGNOSIS — E210 Primary hyperparathyroidism: Secondary | ICD-10-CM | POA: Diagnosis not present

## 2019-03-05 DIAGNOSIS — E042 Nontoxic multinodular goiter: Secondary | ICD-10-CM | POA: Diagnosis not present

## 2019-03-05 DIAGNOSIS — M810 Age-related osteoporosis without current pathological fracture: Secondary | ICD-10-CM

## 2019-03-05 LAB — T3, FREE: T3, Free: 3 pg/mL (ref 2.3–4.2)

## 2019-03-05 LAB — TSH: TSH: 16.35 u[IU]/mL — ABNORMAL HIGH (ref 0.35–4.50)

## 2019-03-05 LAB — CALCIUM: Calcium: 9.5 mg/dL (ref 8.4–10.5)

## 2019-03-05 LAB — T4, FREE: Free T4: 0.57 ng/dL — ABNORMAL LOW (ref 0.60–1.60)

## 2019-03-05 LAB — VITAMIN D 25 HYDROXY (VIT D DEFICIENCY, FRACTURES): VITD: 46.31 ng/mL (ref 30.00–100.00)

## 2019-03-05 NOTE — Patient Instructions (Addendum)
Please stop at the lab.  Please continue vitamin D 2000  Units daily.  Please come back for a follow-up appointment in 6 months.

## 2019-03-05 NOTE — Progress Notes (Signed)
Patient ID: Shannon [name], female   DOB: [birth year], 68 y.o.   MRN: [id number]    HPI  Shannon [name] is a 68 y.o.-year-old female, initially referred by her PCP, Dr. Redmond School, returning for f/u for Primary hyperparathyroidism, OP, multi-nodular thyroid. Last visit 1 year ago.  She had severe sinusitis 2/2 dental wok >> congestion, chills. She was on Prednisone >1 year ago.  She feels the L thyroid nodule is fuller.  Reviewed and addended history:  Pt has had hypercalcemia at least since 2012.  I reviewed her pertinent labs: 02/01/2018: Calcium 9.4, PTH 41 -per checked by Dr. Harlow Asa Lab Results  Component Value Date   PTH 33 06/05/2017   PTH Comment 06/05/2017   PTH 118 (H) 02/03/2017   PTH Comment 02/03/2017   PTH 127 (H) 10/07/2016   PTH 169 (H) 09/07/2016   CALCIUM 9.5 05/31/2018   CALCIUM 10.0 06/05/2017   CALCIUM 8.8 (L) 04/18/2017   CALCIUM 10.8 (H) 04/11/2017   CALCIUM 11.1 (H) 02/03/2017   CALCIUM 11.8 (H) 10/07/2016   CALCIUM 12.1 (H) 08/24/2016   CALCIUM 11.2 (H) 06/04/2012   CALCIUM 11.9 (H) 10/06/2011   Further labs confirmed primary hyperparathyroidism.  Calcium     8.6 - 10.4 mg/dL 11.8 (H)  GFR, Est Non African American     >=60 mL/min >89  Vitamin D 1, 25 (OH) Total     18 - 72 pg/mL 117 (H)  Vitamin D3 1, 25 (OH)     pg/mL 117  Vitamin D2 1, 25 (OH)     pg/mL <8  PTH     15 - 65 pg/mL 127 (H)  Magnesium     1.5 - 2.5 mg/dL 2.0  Phosphorus     2.3 - 4.6 mg/dL 2.3   Urine calcium was high: Component     Latest Ref Rng & Units 10/11/2016  Calcium, Ur     Not estab mg/dL 17  Calcium, 24 hour urine     35 - 250 mg/24 h 476 (H)  Creatinine, Urine     20 - 320 mg/dL 44  Creatinine, 24H Ur     0.63 - 2.50 g/24 h 1.23   11/22/2016: Technetium sestamibi scan: 1. No evidence of ectopic parathyroid adenoma. 2. Asymmetric nodular activity in the left peritracheal region on the delayed images could reflect asymmetric thyroid activity or a left-sided  parathyroid adenoma.  11/30/2016: Neck ultrasound: There are 2 thyroid nodules as described. - The left mid thyroid nodule measures 2.7 x 2.2 x 1.2 cm solid/almost completely solid (2), isoechoic (1), and meets fine-needle aspiration criteria.  - The other nodule measures 0.9 cm x 0.7 x 0.6 cm,  solid/almost completely solid (2), hyperechoic (1), taller-than-wide (3), but not meeting criteria for biopsy. - There are no obvious masses external to the thyroid gland to suggest parathyroid adenoma.  12/09/2016: 4D CT: 1. 13 mm mass posterior and inferior to the right thyroid lobe suspicious for parathyroid adenoma. 2. Additional subcentimeter soft tissue nodules more inferiorly in the right neck, indeterminate. These may reflect small lymph nodes, however an additional parathyroid adenoma is not excluded (particularly the most inferior nodule just above the thoracic inlet). 3. 2.2 cm left thyroid nodule as described on ultrasound.  12/14/2016: Thyroid nodule FNA: benign  She had parathyroidectomy by Dr. Harlow Asa - 01/02/2017.  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  She initially felt well after the surgery, but then started to be fatigued again.  Calcium remains high after the surgery:  11.9 (01/19/2017), while PTH decreased to 76 at follow-up with Dr. Harlow Asa 2 weeks postop.  However, it then increased to 118.  She had a second intervention: Parathyroidectomy in 04/17/2017 >> a second parathyroid adenoma was resected: Parathyroid gland, Left - PARATHYROID ADENOMA.  Her calcium was 8.8 first day postop, then 04/27/2017: Ca 9.2, PTH was 61.  She feels great after the surgery, without complaints of muscle aches, generalized pain, anxiety.  She continues to run.  Thyroid ultrasound obtained by Dr. Harlow Asa Thyroid U/S (02/12/2018): Left mid nodule 2 measures 2.4 x 2.3 x 1.4 cm and previously measured 2.7 x 2.2 x 1.2 cm. Biopsy was performed 12/14/2016. Right lobe nodule measures 0.8 cm and previously measured  0.9 cm. It does not meet criteria for biopsy nor follow-up. IMPRESSION: Left nodule 2 is stable and previously underwent biopsy on 12/14/2016.  Subclinical hypothyroidism:  Reviewed thyroid function tests: Lab Results  Component Value Date   TSH 6.57 (H) 03/02/2018   Lab Results  Component Value Date   FREET4 0.68 03/02/2018   Lab Results  Component Value Date   T3FREE 3.2 03/02/2018   ? 01/2018: TSH 8.98  We decided to just follow her stopping tests, since she has mild subclinical hypothyroidism.  Osteoporosis:  Reviewed latest DXA scan reports from [facility]: Date L1-L4 T score FN T score  05/27/2016 -3.6 RFN -2.9 LFN -3.2   Prev: Date L1-L4 T score FN T score  05/17/2012  -3.1  RFN -2.7 LFN -3.0    She refuses antiresorptive medicines.  She has a history of left thumb fracture in 2001 while playing tennis. She had several falls playing sports or missing a step >> no fxs.  No history of kidney stones.  No CKD. Last BUN/Cr: Lab Results  Component Value Date   BUN 8 05/31/2018   BUN 9 04/18/2017   CREATININE 0.67 05/31/2018   CREATININE 0.58 04/18/2017   No vitamin D deficiency.  Latest vitamin D reviewed: Lab Results  Component Value Date   VD25OH 41.58 03/02/2018   VD25OH 32.31 02/03/2017   VD25OH 35 08/24/2016   VD25OH 31 06/04/2012   She is on vitamin D supplement 2000 units daily.  She eats no dairy.  Her son is allergic to dairy.  Pt does not have a FH of hypercalcemia, pituitary tumors, thyroid cancer.   She continues to do intermittent fasting.  She is mostly vegetarian.  ROS: Constitutional: + weight gain/no weight loss, no fatigue, + subjective hyperthermia, + subjective hypothermia Eyes: no blurry vision, no xerophthalmia ENT: no sore throat, + see HPI Cardiovascular: no CP/no SOB/no palpitations/no leg swelling Respiratory: no cough/no SOB/no wheezing Gastrointestinal: no N/no V/no D/no C/no acid reflux Musculoskeletal: no  muscle aches/no joint aches Skin: no rashes, no hair loss Neurological: no tremors/no numbness/no tingling/no dizziness  I reviewed pt's medications, allergies, PMH, social hx, family hx, and changes were documented in the history of present illness. Otherwise, unchanged from my initial visit note.  Past Medical History:  Diagnosis Date  . Allergy    RHINTIS  . Asthma   . Fibrocystic disease of breast   . Hemorrhoids   . Hx of colonic polyps   . Menopause   . Mitral valve prolapse   . Mitral valve prolapse   . PONV (postoperative nausea and vomiting)    Pt asked to be careful with her teeth, due to them being brittle from low calcium  . Primary hyperparathyroidism ([place])    right  .  Spinal headache    Past Surgical History:  Procedure Laterality Date  . APPENDECTOMY  1964  . COLONOSCOPY    . CYSTOSTOMY W/ BLADDER BIOPSY     Dr. Rosana Hoes  . HEMORRHOID SURGERY     Dr. Truitt Leep  . NECK EXPLORATION  04/17/2017   with parathyroidectomy  . PARATHYROIDECTOMY Right 01/02/2017   Procedure: RIGHT INFERIOR PARATHYROIDECTOMY;  Surgeon: Armandina Gemma, MD;  Location: [place];  Service: General;  Laterality: Right;  . PARATHYROIDECTOMY N/A 04/17/2017   Procedure: NECK EXPLORATION WITH PARATHYROIDECTOMY;  Surgeon: Armandina Gemma, MD;  Location: [place];  Service: General;  Laterality: N/A;  . THUMB FUSION  1997   Dr Maxie Better  . TONSILLECTOMY AND ADENOIDECTOMY    . tummy tuck     Social History   Social History  . Marital status: Married    Spouse name: N/A  . Number of children: 2   Occupational History  . retired    Social History Main Topics  . Smoking status: Former Smoker    Packs/day: 0.25    Years: 10.00    Types: Cigarettes    Quit date: 06/04/1985  . Smokeless tobacco: Never Used  . Alcohol use 3.0 oz/week    5 Glasses of wine per week  . Drug use: No   Current Outpatient Medications on File Prior to Visit  Medication Sig Dispense Refill  . albuterol (PROVENTIL HFA;VENTOLIN  HFA) 108 (90 Base) MCG/ACT inhaler Inhale 2 puffs into the lungs every 6 (six) hours as needed for wheezing or shortness of breath. 1 Inhaler 1  . amoxicillin (AMOXIL) 875 MG tablet Take 1 tablet (875 mg total) by mouth 2 (two) times daily. (Patient not taking: Reported on 06/26/2018) 20 tablet 0  . amoxicillin-clavulanate (AUGMENTIN) 875-125 MG tablet Take 1 tablet by mouth 2 (two) times daily. 20 tablet 0  . Cholecalciferol (EQL VITAMIN D3) 2000 units CAPS Take 2,000 Units by mouth daily.    . Coenzyme Q10 (CO Q-10) 100 MG CAPS Take 100 mg by mouth daily.    . Magnesium 250 MG TABS Take 250 mg by mouth daily.     . predniSONE (STERAPRED UNI-PAK 48 TAB) 10 MG (48) TBPK tablet Take as per manufacturer's recommendations. (Patient not taking: Reported on 11/05/2018) 48 tablet 0  . vitamin B-12 (CYANOCOBALAMIN) 1000 MCG tablet Take 1,000 mcg by mouth daily.     No current facility-administered medications on file prior to visit.    Allergies  Allergen Reactions  . Asa [Aspirin] Other (See Comments)    Minor bleeding   Family History  Problem Relation Age of Onset  . Cancer Father 33       Lung cancer  . Heart disease Father   . Hyperlipidemia Father   . Heart disease Mother 54       Sudden death presumed MI  . Heart disease Brother 65       Presumed CHF  . Heart disease Brother    Significant FH of early death 2/2 AMIs in parents and brothers.  PE: BP (!) 158/60   Pulse 90   Ht 5' 4.5" (1.638 m)   Wt 126 lb (57.2 kg)   SpO2 96%   BMI 21.29 kg/m  Wt Readings from Last 3 Encounters:  03/05/19 126 lb (57.2 kg)  11/05/18 124 lb (56.2 kg)  10/15/18 120 lb 12.8 oz (54.8 kg)   Constitutional: Normal weight, in NAD Eyes: PERRLA, EOMI, no exophthalmos ENT: moist mucous membranes, + L thyroid nodule  easily palpated, no cervical lymphadenopathy Cardiovascular: RRR, No MRG Respiratory: CTA B Gastrointestinal: abdomen soft, NT, ND, BS+ Musculoskeletal: no deformities, strength intact  in all 4 Skin: moist, warm, no rashes Neurological: no tremor with outstretched hands, DTR normal in all 4  Assessment: 1. Primary hyperparathyroidism  2. Thyroid nodules - new dx  3.  Subclinical hypothyroidism  4. OP  Plan: 1. Patient with history of primary hyperparathyroidism, with highest calcium level being 12.1 and highest PTH level being 169.  Several investigations confirmed primary hyperparathyroidism and she was referred to surgery.  She had a right parathyroid adenoma resected in 12/2017, however, unfortunately, her calcium and PTH remain high after the surgery.  She had a second, left, parathyroid adenoma resected in 04/2017 by Dr. Harlow Asa.  After the surgery, her calcium and PTH levels decreased and she started to feel great: More energy, less anxiety, and less generalized pain.  Her scar was slightly edematous at the beginning and she was using scare away patches, but now it has healed. -Before last visit she had labs with Dr. Harlow Asa and a calcium and PTH were normal.  A vitamin D level was also normal. -Continues on 2000 units vitamin D daily >> we will recheck this today along with a calcium level -I will see her back in a year  2. Thyroid nodules -No neck compression symptoms, but she feels that the left thyroid nodule is increased since last visit, but is fluctuating in size, and I suspect that this may be due to her recent congestion with possibly inflammation induced nodule or fluid accumulation.  This nodule is 2.4 cm and the biopsy was benign -Reviewed her most recent thyroid ultrasound from last year: The dominant nodule is still large, but slightly smaller and without changed ultrasound characteristics -We will continue to keep an eye on this nodule, but for now I reassured her that no intervention is needed.  If the size of the nodule continues to increase, we may need another ultrasound to see if the nodule accumulated colloid which needs to be drained.  She agrees  with this.  3.  Subclinical hypothyroidism -Patient has mildly elevated TSH with normal free thyroid hormones -we will continue to just follow her clinically and biochemically for now -recheck TFTs today  4. OP  -Reviewed the report of the latest DXA scan from 2017: T-scores were worse -She refuses antiresorptive medicines and tries to manage this naturally.  She continues to exercise -We will need a new DXA scan now, 2 years after her parathyroid surgery.I advised her to ask her OB/GYN doctor to order this on the same machine  Component     Latest Ref Rng & Units 03/05/2019  Calcium     8.4 - 10.5 mg/dL 9.5  VITD     30.00 - 100.00 ng/mL 46.31  TSH     0.35 - 4.50 uIU/mL 16.35 (H)  T4,Free(Direct)     0.60 - 1.60 ng/dL 0.57 (L)  Triiodothyronine,Free,Serum     2.3 - 4.2 pg/mL 3.0   Normal calcium, PTH, vitamin D.  Overt hypothyroidism. Will Start LT4 50 mcg daily and repeat TFTs in 5-6 weeks.  Philemon Kingdom, MD PhD [facility] Endocrinology

## 2019-03-06 MED ORDER — LEVOTHYROXINE SODIUM 50 MCG PO TABS: 50.0000 ug | ORAL_TABLET | Freq: Every day | ORAL | 3 refills | Status: DC

## 2019-03-08 ENCOUNTER — Telehealth: Payer: Self-pay

## 2019-03-08 NOTE — Telephone Encounter (Signed)
Patient called today stating she started levothyroxine 50 mcg yesterday and she woke up at 5 am this morning with leg cramps- she was told to call if this happens-patient did go ahead and take a second pill today but wants to know if she should be concerned-patient reported drinking a full glass of water with medication,taking it without any other medication and staying hydrated all day-please advise

## 2019-03-08 NOTE — Telephone Encounter (Signed)
Notified patient of message from Dr. Gherghe, patient expressed understanding and agreement. No further questions.  

## 2019-03-08 NOTE — Telephone Encounter (Signed)
M, let's have her cut the pill in half and take only half a pill for 2 weeks, then increase to 50 mcg daily.  I would want her to come back for another lab check after she has been on the 50 mcg for 5 to 6 weeks.

## 2019-03-12 ENCOUNTER — Telehealth: Payer: Self-pay | Admitting: Internal Medicine

## 2019-03-12 NOTE — Telephone Encounter (Signed)
Patient called re: Levothyroxine. Patient states that the 1st day she took the above medication she started cramping. Patient called and Dr. Cruzita Lederer cut the dosage in half. Patient is now sweating with each day the sweating gets worse. Patient would like to know if that is normal. Please call patient at ph# [phone] or  send patient a message on My Chart to advise.

## 2019-03-12 NOTE — Telephone Encounter (Signed)
That is quite unusual. Stop the medication, then restart 25 mcg every other day for 1 week, then retry the daily dose. She absolutely needs to medication and this is such a low dose.Marland KitchenMarland Kitchen

## 2019-03-13 ENCOUNTER — Encounter: Payer: Self-pay | Admitting: Internal Medicine

## 2019-03-13 NOTE — Telephone Encounter (Signed)
Patient called re: patient has not heard back from our office re: issues she is having with Levothyroxine. Patient has been sweating profusely. Please call patient at ph# [phone] to advise.

## 2019-03-14 NOTE — Telephone Encounter (Signed)
Notified patient of message from Dr. Gherghe, patient expressed understanding and agreement. No further questions.  

## 2019-03-14 NOTE — Telephone Encounter (Signed)
Please refer to open phone note regarding this where the doctor has already documented.

## 2019-04-11 ENCOUNTER — Other Ambulatory Visit: Payer: Self-pay

## 2019-04-11 ENCOUNTER — Other Ambulatory Visit: Payer: Self-pay | Admitting: Internal Medicine

## 2019-04-11 ENCOUNTER — Other Ambulatory Visit (INDEPENDENT_AMBULATORY_CARE_PROVIDER_SITE_OTHER): Payer: Medicare Other

## 2019-04-11 DIAGNOSIS — E039 Hypothyroidism, unspecified: Secondary | ICD-10-CM | POA: Diagnosis not present

## 2019-04-11 LAB — TSH: TSH: 9.88 u[IU]/mL — ABNORMAL HIGH (ref 0.35–4.50)

## 2019-04-11 LAB — T4, FREE: Free T4: 0.66 ng/dL (ref 0.60–1.60)

## 2019-04-11 MED ORDER — LEVOTHYROXINE SODIUM 75 MCG PO TABS: 75.0000 ug | ORAL_TABLET | Freq: Every day | ORAL | 3 refills | Status: DC

## 2019-04-16 ENCOUNTER — Other Ambulatory Visit: Payer: Self-pay | Admitting: Family Medicine

## 2019-04-16 DIAGNOSIS — J4520 Mild intermittent asthma, uncomplicated: Secondary | ICD-10-CM

## 2019-04-16 NOTE — Telephone Encounter (Signed)
[facility] is requesting to fill pt albuterol. Please advise K[facility] 

## 2019-04-17 ENCOUNTER — Telehealth: Payer: Self-pay | Admitting: Internal Medicine

## 2019-04-17 NOTE — Telephone Encounter (Signed)
Notified patient of message from Dr. Gherghe, patient expressed understanding and agreement. No further questions.  

## 2019-04-17 NOTE — Telephone Encounter (Signed)
Great!  We will need to check her labs (TSH, free T4, free T3) after she stays on the current dose for at least 5 weeks.

## 2019-04-17 NOTE — Telephone Encounter (Signed)
Patient wanted to advice that she has been taking a half pill of her levothyroxine (SYNTHROID) 75 MCG tablet. But states when Dr.Gherghe sent her the message she is now increased to 50 mg.  She has been feeling well but just with a few aches and pains.  Patient states she has been on the full 75mg  for a full week.

## 2019-06-06 ENCOUNTER — Other Ambulatory Visit: Payer: Self-pay

## 2019-06-06 ENCOUNTER — Other Ambulatory Visit: Payer: Self-pay | Admitting: Internal Medicine

## 2019-06-06 ENCOUNTER — Other Ambulatory Visit (INDEPENDENT_AMBULATORY_CARE_PROVIDER_SITE_OTHER): Payer: Medicare Other

## 2019-06-06 DIAGNOSIS — E039 Hypothyroidism, unspecified: Secondary | ICD-10-CM

## 2019-06-06 DIAGNOSIS — Z23 Encounter for immunization: Secondary | ICD-10-CM | POA: Diagnosis not present

## 2019-06-06 LAB — T4, FREE: Free T4: 0.97 ng/dL (ref 0.60–1.60)

## 2019-06-06 LAB — TSH: TSH: 6.3 u[IU]/mL — ABNORMAL HIGH (ref 0.35–4.50)

## 2019-06-06 MED ORDER — LEVOTHYROXINE SODIUM 88 MCG PO TABS: 88.0000 ug | ORAL_TABLET | Freq: Every day | ORAL | 3 refills | Status: DC

## 2019-06-10 ENCOUNTER — Telehealth: Payer: Self-pay | Admitting: Internal Medicine

## 2019-06-10 NOTE — Telephone Encounter (Signed)
It is a general statement. It only pertains on the Biotin from supplements.

## 2019-06-10 NOTE — Telephone Encounter (Signed)
Pt is asking if she needs to make adjustments on what she is eating because she sees the note on the t4 level that her biotin is high?  Please advise  I looked at the note in the labs and let her know that the comment about biotin is a general statement but she is still curious

## 2019-06-12 ENCOUNTER — Encounter: Payer: Self-pay | Admitting: Family Medicine

## 2019-06-19 ENCOUNTER — Telehealth: Payer: Self-pay | Admitting: Internal Medicine

## 2019-06-19 NOTE — Telephone Encounter (Signed)
Pt is scheduled for tomorrow for pneumonia shot but nothing in chart about which one she is suppose to have, please advise

## 2019-06-20 ENCOUNTER — Other Ambulatory Visit (INDEPENDENT_AMBULATORY_CARE_PROVIDER_SITE_OTHER): Payer: Medicare Other

## 2019-06-20 ENCOUNTER — Other Ambulatory Visit: Payer: Self-pay

## 2019-06-20 DIAGNOSIS — Z23 Encounter for immunization: Secondary | ICD-10-CM

## 2019-07-11 ENCOUNTER — Ambulatory Visit (INDEPENDENT_AMBULATORY_CARE_PROVIDER_SITE_OTHER): Payer: Medicare Other | Admitting: Family Medicine

## 2019-07-11 ENCOUNTER — Encounter: Payer: Self-pay | Admitting: Family Medicine

## 2019-07-11 ENCOUNTER — Other Ambulatory Visit: Payer: Self-pay

## 2019-07-11 VITALS — BP 134/76 | HR 80 | Temp 97.8°F | Ht 66.0 in | Wt 126.0 lb

## 2019-07-11 DIAGNOSIS — L989 Disorder of the skin and subcutaneous tissue, unspecified: Secondary | ICD-10-CM

## 2019-07-11 NOTE — Progress Notes (Signed)
   Subjective:    Patient ID: Shannon [name], female    DOB: [birth year], 68 y.o.   MRN: [id number]  HPI She has a lesion on her left forehead that is been there for 3 months and has not healed.  She does note some slight increased sensitivity to that area otherwise no other symptoms.   Review of Systems     Objective:   Physical Exam By 1 cm healing lesion with raised edges is noted in the left upper forehead.  No other lesions were noted.       Assessment & Plan:  Facial lesion - Plan: Ambulatory referral to Dermatology I reassured her that I did not think that she was in any danger and that this could easily be cancer but 1 that is easily treated.

## 2019-07-16 DIAGNOSIS — L578 Other skin changes due to chronic exposure to nonionizing radiation: Secondary | ICD-10-CM | POA: Diagnosis not present

## 2019-07-16 DIAGNOSIS — L570 Actinic keratosis: Secondary | ICD-10-CM | POA: Diagnosis not present

## 2019-07-16 DIAGNOSIS — D485 Neoplasm of uncertain behavior of skin: Secondary | ICD-10-CM | POA: Diagnosis not present

## 2019-07-16 DIAGNOSIS — L821 Other seborrheic keratosis: Secondary | ICD-10-CM | POA: Diagnosis not present

## 2019-08-13 ENCOUNTER — Encounter: Payer: Self-pay | Admitting: Family Medicine

## 2019-08-13 ENCOUNTER — Other Ambulatory Visit: Payer: Self-pay

## 2019-08-13 ENCOUNTER — Ambulatory Visit (INDEPENDENT_AMBULATORY_CARE_PROVIDER_SITE_OTHER): Payer: Medicare Other | Admitting: Family Medicine

## 2019-08-13 VITALS — BP 122/80 | HR 84 | Temp 96.8°F | Ht 65.0 in | Wt 121.8 lb

## 2019-08-13 DIAGNOSIS — F410 Panic disorder [episodic paroxysmal anxiety] without agoraphobia: Secondary | ICD-10-CM

## 2019-08-13 DIAGNOSIS — E039 Hypothyroidism, unspecified: Secondary | ICD-10-CM

## 2019-08-13 DIAGNOSIS — D179 Benign lipomatous neoplasm, unspecified: Secondary | ICD-10-CM | POA: Diagnosis not present

## 2019-08-13 DIAGNOSIS — J4520 Mild intermittent asthma, uncomplicated: Secondary | ICD-10-CM

## 2019-08-13 DIAGNOSIS — E892 Postprocedural hypoparathyroidism: Secondary | ICD-10-CM

## 2019-08-13 DIAGNOSIS — Z9009 Acquired absence of other part of head and neck: Secondary | ICD-10-CM | POA: Diagnosis not present

## 2019-08-13 DIAGNOSIS — M810 Age-related osteoporosis without current pathological fracture: Secondary | ICD-10-CM | POA: Diagnosis not present

## 2019-08-13 NOTE — Progress Notes (Signed)
Shannon [name] is a 68 y.o. female who presents for annual wellness visit and follow-up on chronic medical conditions.  She has multiple skin lesions that she would like me to look at.  She also has a recent biopsy done on the forehead lesion which was a precancerous lesion which has her scared.  She presently is taking thyroid supplementation however the TSH was elevated and she needs another one drawn today.  She has had difficulty with sleep disturbance for the last year and thinks it is related to her thyroid medication.  She does have a history of osteoporosis and will be getting a DEXA scan as well as mammogram in the near future.  The osteoporosis is being managed by endocrinology because of her underlying parathyroid issues.  She does have mild intermittent asthma and is doing well on albuterol.  She did have difficulty with piriformis syndrome but this has been cleared up.  She does have a history of panic attacks mainly revolving around driving.  She has a lot of anxiety revolving around Covid and her sleep habits. Immunizations and Health Maintenance Immunization History  Administered Date(s) Administered  . DTaP 04/17/1998  . Fluad Quad(high Dose 65+) 06/06/2019  . Influenza Split 10/06/2011, 07/21/2015  . Influenza,inj,Quad PF,6+ Mos 06/05/2017, 06/26/2018  . PPD Test 07/21/1982  . Pneumococcal Conjugate-13 06/20/2019  . Tdap 06/30/2009  . Zoster 06/04/2012   Health Maintenance Due  Topic Date Due  . MAMMOGRAM  05/17/2014  . TETANUS/TDAP  07/01/2019    Last Pap smear: Aged out  Last mammogram: due 08/2019 Last colonoscopy: cologuard negative  Last DEXA: 05/27/16 Dentist: 07/2019 Ophtho: 2019 Exercise: running QD  Other doctors caring for patient include:Gherghe  Gerkin  Advanced directives: Not discussed    Depression screen:  See questionnaire below.  Depression screen [facility] 2/9 08/13/2019 05/31/2018  Decreased Interest 0 0  Down, Depressed, Hopeless 0 0  PHQ - 2 Score 0 0     Fall Risk Screen: see questionnaire below. Fall Risk  08/13/2019 05/31/2018  Falls in the past year? 0 No    ADL screen:  See questionnaire below Functional Status Survey: Is the patient deaf or have difficulty hearing?: No Does the patient have difficulty seeing, even when wearing glasses/contacts?: No Does the patient have difficulty concentrating, remembering, or making decisions?: No Does the patient have difficulty walking or climbing stairs?: No Does the patient have difficulty dressing or bathing?: No Does the patient have difficulty doing errands alone such as visiting a doctor's office or shopping?: No   Review of Systems Constitutional: -, -unexpected weight change, -anorexia, -fatigue Allergy: -sneezing, -itching, -congestion Dermatology: denies changing moles, rash, lumps ENT: -runny nose, -ear pain, -sore throat,  Cardiology:  -chest pain, -palpitations, -orthopnea, Respiratory: -cough, -shortness of breath, -dyspnea on exertion, -wheezing,  Gastroenterology: -abdominal pain, -nausea, -vomiting, -diarrhea, -constipation, -dysphagia Hematology: -bleeding or bruising problems Musculoskeletal: -arthralgias, -myalgias, -joint swelling, -back pain, - Ophthalmology: -vision changes,  Urology: -dysuria, -difficulty urinating,  -urinary frequency, -urgency, incontinence Neurology: -, -numbness, , -memory loss, -falls, -dizziness    PHYSICAL EXAM:  BP 122/80 (BP Location: Left Arm, Patient Position: Sitting)   Pulse 84   Temp (!) 96.8 F (36 C)   Ht 5\' 5"  (1.651 m)   Wt 121 lb 12.8 oz (55.2 kg)   SpO2 98%   BMI 20.27 kg/m   General Appearance: Alert, cooperative, no distress, appears stated age Head: Normocephalic, without obvious abnormality, atraumatic Eyes: PERRL, conjunctiva/corneas clear, EOM's intact, fundi benign  Ears: Normal TM's and external ear canals Nose: Nares normal, mucosa normal, no drainage or sinus tenderness Throat: Lips, mucosa, and tongue  normal; teeth and gums normal Neck: Supple, no lymphadenopathy;  thyroid:  no enlargement/tenderness/nodules; no carotid bruit or JVD Lungs: Clear to auscultation bilaterally without wheezes, rales or ronchi; respirations unlabored Heart: Regular rate and rhythm, S1 and S2 normal, no murmur, rubor gallop Abdomen: Soft, non-tender, nondistended, normoactive bowel sounds,  no masses, no hepatosplenomegaly Extremities: No clubbing, cyanosis or edema Pulses: 2+ and symmetric all extremities Skin:  Skin color, texture, turgor normal, she does have multiple lipomas and one ganglion present as well as cherry angiomas.  Lymph nodes: Cervical, supraclavicular, and axillary nodes normal Neurologic:  CNII-XII intact, normal strength, sensation and gait; reflexes 2+ and symmetric throughout Psych: Normal mood, affect, hygiene and grooming.  ASSESSMENT/PLAN: H/O parathyroidectomy - Plan: TSH, CBC with Differential/Platelet, Comprehensive metabolic panel : Follow-up with endocrinology  Panic attacks: Treat as needed.  Also had a long discussion with her concerning Covid and proper procedures including wearing a mask, social distancing and washing hands.  Age-related osteoporosis without current pathological fracture: Follow-up with endocrinology  Mild intermittent asthma, unspecified whether complicated - Plan: CBC with Differential/Platelet, Comprehensive metabolic panel use albuterol as needed   Hypothyroidism, unspecified type - Plan: TSH, CBC with Differential/Platelet, Comprehensive metabolic panel  Multiple lipomas: I reassured her that all of her skin lesions are benign in nature. Recommend getting Tdap and Shingrix.    Immunization recommendations discussed.  Colonoscopy recommendations reviewed   Medicare Attestation I have personally reviewed: The patient's medical and social history Their use of alcohol, tobacco or illicit drugs Their current medications and supplements The patient's  functional ability including ADLs,fall risks, home safety risks, cognitive, and hearing and visual impairment Diet and physical activities Evidence for depression or mood disorders  The patient's weight, height, and BMI have been recorded in the chart.  I have made referrals, counseling, and provided education to the patient based on review of the above and I have provided the patient with a written personalized care plan for preventive services.     Jill Alexanders, MD   08/13/2019

## 2019-08-14 LAB — COMPREHENSIVE METABOLIC PANEL
ALT: 21 IU/L (ref 0–32)
AST: 27 IU/L (ref 0–40)
Albumin/Globulin Ratio: 2.2 (ref 1.2–2.2)
Albumin: 4.8 g/dL (ref 3.8–4.8)
Alkaline Phosphatase: 65 IU/L (ref 39–117)
BUN/Creatinine Ratio: 11 — ABNORMAL LOW (ref 12–28)
BUN: 7 mg/dL — ABNORMAL LOW (ref 8–27)
Bilirubin Total: 0.5 mg/dL (ref 0.0–1.2)
CO2: 24 mmol/L (ref 20–29)
Calcium: 9.8 mg/dL (ref 8.7–10.3)
Chloride: 98 mmol/L (ref 96–106)
Creatinine, Ser: 0.65 mg/dL (ref 0.57–1.00)
GFR calc Af Amer: 106 mL/min/{1.73_m2} (ref >59)
GFR calc non Af Amer: 92 mL/min/{1.73_m2} (ref >59)
Globulin, Total: 2.2 g/dL (ref 1.5–4.5)
Glucose: 104 mg/dL — ABNORMAL HIGH (ref 65–99)
Potassium: 4.7 mmol/L (ref 3.5–5.2)
Sodium: 137 mmol/L (ref 134–144)
Total Protein: 7 g/dL (ref 6.0–8.5)

## 2019-08-14 LAB — CBC WITH DIFFERENTIAL/PLATELET
Basophils Absolute: 0 10*3/uL (ref 0.0–0.2)
Basos: 1 %
EOS (ABSOLUTE): 0.1 10*3/uL (ref 0.0–0.4)
Eos: 1 %
Hematocrit: 41.9 % (ref 34.0–46.6)
Hemoglobin: 14.4 g/dL (ref 11.1–15.9)
Immature Grans (Abs): 0 10*3/uL (ref 0.0–0.1)
Immature Granulocytes: 0 %
Lymphocytes Absolute: 2.2 10*3/uL (ref 0.7–3.1)
Lymphs: 32 %
MCH: 32.6 pg (ref 26.6–33.0)
MCHC: 34.4 g/dL (ref 31.5–35.7)
MCV: 95 fL (ref 79–97)
Monocytes Absolute: 0.5 10*3/uL (ref 0.1–0.9)
Monocytes: 8 %
Neutrophils Absolute: 4 10*3/uL (ref 1.4–7.0)
Neutrophils: 58 %
Platelets: 305 10*3/uL (ref 150–450)
RBC: 4.42 x10E6/uL (ref 3.77–5.28)
RDW: 12.1 % (ref 11.7–15.4)
WBC: 6.8 10*3/uL (ref 3.4–10.8)

## 2019-08-14 LAB — TSH: TSH: 2.27 u[IU]/mL (ref 0.450–4.500)

## 2019-08-28 ENCOUNTER — Ambulatory Visit: Payer: Medicare Other | Admitting: Internal Medicine

## 2019-10-30 ENCOUNTER — Ambulatory Visit: Payer: Medicare Other | Attending: Internal Medicine

## 2019-10-30 ENCOUNTER — Ambulatory Visit: Payer: Medicare Other | Admitting: Internal Medicine

## 2019-10-30 DIAGNOSIS — Z23 Encounter for immunization: Secondary | ICD-10-CM

## 2019-10-30 NOTE — Progress Notes (Signed)
   Covid-19 Vaccination Clinic  Name:  KATANNA REMBERT    MRN: [id number] DOB: 1951/10/10  10/30/2019  Ms. Moynahan was observed post Covid-19 immunization for 15 minutes without incidence. She was provided with Vaccine Information Sheet and instruction to access the V-Safe system.   Ms. Delashmit was instructed to call 911 with any severe reactions post vaccine: Marland Kitchen Difficulty breathing  . Swelling of your face and throat  . A fast heartbeat  . A bad rash all over your body  . Dizziness and weakness    Immunizations Administered    Name Date Dose VIS Date Route   Pfizer COVID-19 Vaccine 10/30/2019 10:47 AM 0.3 mL 09/20/2019 Intramuscular   Manufacturer: [place]   Lot: [id number]   [place]: [id number]

## 2019-11-07 ENCOUNTER — Ambulatory Visit: Payer: Medicare Other

## 2019-11-14 ENCOUNTER — Ambulatory Visit: Payer: Medicare Other | Admitting: Internal Medicine

## 2019-11-18 ENCOUNTER — Ambulatory Visit: Payer: Medicare Other

## 2019-11-18 ENCOUNTER — Ambulatory Visit: Payer: Medicare Other | Attending: Internal Medicine

## 2019-11-18 DIAGNOSIS — Z23 Encounter for immunization: Secondary | ICD-10-CM | POA: Insufficient documentation

## 2019-11-18 NOTE — Progress Notes (Signed)
   Covid-19 Vaccination Clinic  Name:  ELVA CUADRADO    MRN: [id number] DOB: Oct 03, 1951  11/18/2019  Ms. Stepien was observed post Covid-19 immunization for 15 minutes without incidence. She was provided with Vaccine Information Sheet and instruction to access the V-Safe system.   Ms. Asel was instructed to call 911 with any severe reactions post vaccine: Marland Kitchen Difficulty breathing  . Swelling of your face and throat  . A fast heartbeat  . A bad rash all over your body  . Dizziness and weakness    Immunizations Administered    Name Date Dose VIS Date Route   Pfizer COVID-19 Vaccine 11/18/2019 10:37 AM 0.3 mL 09/20/2019 Intramuscular   Manufacturer: [place]   Lot: [id number]   [place]: [id number]

## 2019-11-20 ENCOUNTER — Ambulatory Visit: Payer: Medicare Other

## 2019-12-03 DIAGNOSIS — L570 Actinic keratosis: Secondary | ICD-10-CM | POA: Diagnosis not present

## 2019-12-03 DIAGNOSIS — L298 Other pruritus: Secondary | ICD-10-CM | POA: Diagnosis not present

## 2019-12-16 DIAGNOSIS — H2513 Age-related nuclear cataract, bilateral: Secondary | ICD-10-CM | POA: Diagnosis not present

## 2019-12-20 ENCOUNTER — Other Ambulatory Visit: Payer: Self-pay

## 2019-12-24 ENCOUNTER — Encounter: Payer: Self-pay | Admitting: Internal Medicine

## 2019-12-24 ENCOUNTER — Other Ambulatory Visit: Payer: Self-pay

## 2019-12-24 ENCOUNTER — Ambulatory Visit (INDEPENDENT_AMBULATORY_CARE_PROVIDER_SITE_OTHER): Payer: Medicare Other | Admitting: Internal Medicine

## 2019-12-24 VITALS — BP 118/70 | HR 72 | Ht 64.75 in | Wt 121.0 lb

## 2019-12-24 DIAGNOSIS — E039 Hypothyroidism, unspecified: Secondary | ICD-10-CM

## 2019-12-24 DIAGNOSIS — E210 Primary hyperparathyroidism: Secondary | ICD-10-CM

## 2019-12-24 DIAGNOSIS — E042 Nontoxic multinodular goiter: Secondary | ICD-10-CM

## 2019-12-24 DIAGNOSIS — M810 Age-related osteoporosis without current pathological fracture: Secondary | ICD-10-CM

## 2019-12-24 NOTE — Progress Notes (Signed)
Patient ID: Shannon [name], female   DOB: [birth year], 69 y.o.   MRN: [id number]   This visit occurred during the SARS-CoV-2 public health emergency.  Safety protocols were in place, including screening questions prior to the visit, additional usage of staff PPE, and extensive cleaning of exam room while observing appropriate contact time as indicated for disinfecting solutions.   HPI  Shannon [name] is a 69 y.o.-year-old female, initially referred by her PCP, Dr. Redmond School, returning for f/u for H/o Primary hyperparathyroidism, OP, multi-nodular thyroid. Last visit 10 months ago.  Reviewed and addended hx:  Pt has had hypercalcemia at least since 2012.  I have reviewed pertinent labs: No 08/13/2019: Calcium 9.8 02/01/2018: Calcium 9.4, PTH 41 -per checked by Dr. Harlow Asa Lab Results  Component Value Date   PTH 33 06/05/2017   PTH Comment 06/05/2017   PTH 118 (H) 02/03/2017   PTH Comment 02/03/2017   PTH 127 (H) 10/07/2016   PTH 169 (H) 09/07/2016   CALCIUM 9.8 08/13/2019   CALCIUM 9.5 03/05/2019   CALCIUM 9.5 05/31/2018   CALCIUM 10.0 06/05/2017   CALCIUM 8.8 (L) 04/18/2017   CALCIUM 10.8 (H) 04/11/2017   CALCIUM 11.1 (H) 02/03/2017   CALCIUM 11.8 (H) 10/07/2016   CALCIUM 12.1 (H) 08/24/2016   CALCIUM 11.2 (H) 06/04/2012   Further labs confirm primary hyperparathyroidism:  Calcium     8.6 - 10.4 mg/dL 11.8 (H)  GFR, Est Non African American     >=60 mL/min >89  Vitamin D 1, 25 (OH) Total     18 - 72 pg/mL 117 (H)  Vitamin D3 1, 25 (OH)     pg/mL 117  Vitamin D2 1, 25 (OH)     pg/mL <8  PTH     15 - 65 pg/mL 127 (H)  Magnesium     1.5 - 2.5 mg/dL 2.0  Phosphorus     2.3 - 4.6 mg/dL 2.3   Urine calcium was high: Component     Latest Ref Rng & Units 10/11/2016  Calcium, Ur     Not estab mg/dL 17  Calcium, 24 hour urine     35 - 250 mg/24 h 476 (H)  Creatinine, Urine     20 - 320 mg/dL 44  Creatinine, 24H Ur     0.63 - 2.50 g/24 h 1.23   11/22/2016: Technetium  sestamibi scan: 1. No evidence of ectopic parathyroid adenoma. 2. Asymmetric nodular activity in the left peritracheal region on the delayed images could reflect asymmetric thyroid activity or a left-sided parathyroid adenoma.  11/30/2016: Neck ultrasound: There are 2 thyroid nodules as described. - The left mid thyroid nodule measures 2.7 x 2.2 x 1.2 cm solid/almost completely solid (2), isoechoic (1), and meets fine-needle aspiration criteria.  - The other nodule measures 0.9 cm x 0.7 x 0.6 cm,  solid/almost completely solid (2), hyperechoic (1), taller-than-wide (3), but not meeting criteria for biopsy. - There are no obvious masses external to the thyroid gland to suggest parathyroid adenoma.  12/09/2016: 4D CT: 1. 13 mm mass posterior and inferior to the right thyroid lobe suspicious for parathyroid adenoma. 2. Additional subcentimeter soft tissue nodules more inferiorly in the right neck, indeterminate. These may reflect small lymph nodes, however an additional parathyroid adenoma is not excluded (particularly the most inferior nodule just above the thoracic inlet). 3. 2.2 cm left thyroid nodule as described on ultrasound.  12/14/2016: Thyroid nodule FNA: benign  She had parathyroidectomy by Dr. Harlow Asa - 01/02/2017.  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  She initially felt well after the surgery, but then started to be fatigued again.  Calcium remains high after the surgery: 11.9 (01/19/2017), while PTH decreased to 76 at follow-up with Dr. Harlow Asa 2 weeks postop.  However, it then increased to 118.  She had a second intervention: Parathyroidectomy in 04/17/2017 >> a second parathyroid adenoma was resected: Parathyroid gland, Left - PARATHYROID ADENOMA.  Her calcium was 8.8 first day postop, then 04/27/2017: Ca 9.2, PTH was 61.She feels great after the surgery.    She feels great after the Sx.  She continues to exercise (running).  Thyroid ultrasound obtained by Dr. Arnoldo Morale: Thyroid U/S  (02/12/2018): Left mid nodule 2 measures 2.4 x 2.3 x 1.4 cm and previously measured 2.7 x 2.2 x 1.2 cm. Biopsy was performed 12/14/2016. Right lobe nodule measures 0.8 cm and previously measured 0.9 cm. It does not meet criteria for biopsy nor follow-up. IMPRESSION: Left nodule 2 is stable and previously underwent biopsy on 12/14/2016.  Hypothyroidism:  At last visit, her TSH was elevated and we will follow this conservatively but since TSH remain high, we started levothyroxine.  Pt is on levothyroxine 88 mcg daily, taken: - in am - fasting - at least 2h from b'fast - no Ca, Fe, MVI, PPIs - not on Biotin On B12, D3, CoQ10, Mg.  Reviewed her TFTs: Lab Results  Component Value Date   TSH 2.270 08/13/2019   TSH 6.30 (H) 06/06/2019   TSH 9.88 (H) 04/11/2019   TSH 16.35 (H) 03/05/2019   TSH 6.57 (H) 03/02/2018   Lab Results  Component Value Date   FREET4 0.97 06/06/2019   FREET4 0.66 04/11/2019   FREET4 0.57 (L) 03/05/2019   FREET4 0.68 03/02/2018   Lab Results  Component Value Date   T3FREE 3.0 03/05/2019   T3FREE 3.2 03/02/2018   ? 01/2018: TSH 8.98  Osteoporosis: -No falls or fractures since last visit  Spiculated DXA scan reports from [facility]: Date L1-L4 T score FN T score  05/27/2016 -3.6 RFN -2.9 LFN -3.2   Prev: Date L1-L4 T score FN T score  05/17/2012  -3.1  RFN -2.7 LFN -3.0    She refuses antiresorptive medicine.  She has a history of left thumb fracture in 2001 while playing tennis. She had several falls playing sports or missing a step >> no fxs.  No history of kidney stones.  No CKD. Last BUN/Cr: Lab Results  Component Value Date   BUN 7 (L) 08/13/2019   BUN 8 05/31/2018   CREATININE 0.65 08/13/2019   CREATININE 0.67 05/31/2018   No history of vitamin D deficiency: Lab Results  Component Value Date   VD25OH 46.31 03/05/2019   VD25OH 41.58 03/02/2018   VD25OH 32.31 02/03/2017   VD25OH 35 08/24/2016   VD25OH 31 06/04/2012    She continues on 2000 units vitamin D daily.  She is not eating dairy.  Her son is allergic to dairy.  Pt does not have a FH of hypercalcemia, pituitary tumors, thyroid cancer.   She is doing intermittent fasting and is a vegetarian.  ROS: Constitutional: no weight gain/no weight loss, no fatigue, no subjective hyperthermia, no subjective hypothermia Eyes: no blurry vision, no xerophthalmia ENT: no sore throat, + see HPI Cardiovascular: no CP/no SOB/no palpitations/no leg swelling Respiratory: no cough/no SOB/no wheezing Gastrointestinal: no N/no V/no D/no C/no acid reflux Musculoskeletal: no muscle aches/no joint aches Skin: no rashes, no hair loss Neurological: no tremors/no numbness/no tingling/no dizziness  I reviewed pt's medications, allergies, PMH, social hx, family hx, and changes were documented in the history of present illness. Otherwise, unchanged from my initial visit note.  Past Medical History:  Diagnosis Date  . Allergy    RHINTIS  . Asthma   . Fibrocystic disease of breast   . Hemorrhoids   . Hx of colonic polyps   . Menopause   . Mitral valve prolapse   . Mitral valve prolapse   . PONV (postoperative nausea and vomiting)    Pt asked to be careful with her teeth, due to them being brittle from low calcium  . Primary hyperparathyroidism ([place])    right  . Spinal headache    Past Surgical History:  Procedure Laterality Date  . APPENDECTOMY  1964  . COLONOSCOPY    . CYSTOSTOMY W/ BLADDER BIOPSY     Dr. Rosana Hoes  . HEMORRHOID SURGERY     Dr. Truitt Leep  . NECK EXPLORATION  04/17/2017   with parathyroidectomy  . PARATHYROIDECTOMY Right 01/02/2017   Procedure: RIGHT INFERIOR PARATHYROIDECTOMY;  Surgeon: Armandina Gemma, MD;  Location: [facility];  Service: General;  Laterality: Right;  . PARATHYROIDECTOMY N/A 04/17/2017   Procedure: NECK EXPLORATION WITH PARATHYROIDECTOMY;  Surgeon: Armandina Gemma, MD;  Location: [facility];  Service: General;  Laterality: N/A;  . THUMB  FUSION  1997   Dr Maxie Better  . TONSILLECTOMY AND ADENOIDECTOMY    . tummy tuck     Social History   Social History  . Marital status: Married    Spouse name: N/A  . Number of children: 2   Occupational History  . retired    Social History Main Topics  . Smoking status: Former Smoker    Packs/day: 0.25    Years: 10.00    Types: Cigarettes    Quit date: 06/04/1985  . Smokeless tobacco: Never Used  . Alcohol use 3.0 oz/week    5 Glasses of wine per week  . Drug use: No   Current Outpatient Medications on File Prior to Visit  Medication Sig Dispense Refill  . Cholecalciferol (EQL VITAMIN D3) 2000 units CAPS Take 2,000 Units by mouth daily.    . Coenzyme Q10 (CO Q-10) 100 MG CAPS Take 100 mg by mouth daily.    Marland Kitchen levothyroxine (SYNTHROID) 88 MCG tablet Take 1 tablet (88 mcg total) by mouth daily before breakfast. 90 tablet 3  . Magnesium 250 MG TABS Take 250 mg by mouth daily.     . VENTOLIN HFA 108 (90 Base) MCG/ACT inhaler INHALE 2 PUFFS INTO THE LUNGS EVERY 6 HOURS AS NEEDED FOR WHEEZING OR SHORTNESS OF BREATH 18 g 0  . vitamin B-12 (CYANOCOBALAMIN) 1000 MCG tablet Take 1,000 mcg by mouth daily.     No current facility-administered medications on file prior to visit.   Allergies  Allergen Reactions  . Asa [Aspirin] Other (See Comments)    Minor bleeding   Family History  Problem Relation Age of Onset  . Cancer Father 64       Lung cancer  . Heart disease Father   . Hyperlipidemia Father   . Heart disease Mother 52       Sudden death presumed MI  . Heart disease Brother 50       Presumed CHF  . Heart disease Brother    Significant FH of early death 2/2 AMIs in parents and brothers.  PE: BP 118/70   Pulse 72   Ht 5' 4.75" (1.645 m) Comment: measured today without  shoes  Wt 121 lb (54.9 kg)   SpO2 97%   BMI 20.29 kg/m  Wt Readings from Last 3 Encounters:  12/24/19 121 lb (54.9 kg)  08/13/19 121 lb 12.8 oz (55.2 kg)  07/11/19 126 lb (57.2 kg)   Constitutional:  Normal weight, in NAD Eyes: PERRLA, EOMI, no exophthalmos ENT: moist mucous membranes, no thyromegaly, but left thyroid nodule easily palpable and mobile no cervical lymphadenopathy Cardiovascular: RRR, No MRG Respiratory: CTA B Gastrointestinal: abdomen soft, NT, ND, BS+ Musculoskeletal: no deformities, strength intact in all 4 Skin: moist, warm, no rashes Neurological: no tremor with outstretched hands, DTR normal in all 4  Assessment: 1. Primary hyperparathyroidism  2. Thyroid nodules - new dx  3.  Subclinical hypothyroidism  4. OP  Plan: 1. Patient with history of primary hyperparathyroidism, with highest calcium level being 12.1 and highest PTH level being 169.  Several investigations confirmed primary hyperparathyroidism and she was referred to surgery.  She had a right parathyroid adenoma resected in 12/2017, however, unfortunately, her calcium and PTH remain high after the surgery.  She had a second, left, parathyroid adenoma resected in 04/2017 by Dr. Harlow Asa.  After the surgery, her calcium and PTH levels decreased and she started to feel great: More energy, less anxiety, and less generalized pain.  Her scar was slightly edematous at the beginning and she was using scare away patches, but it has healed now. -Latest labs with Dr. Harlow Asa showed: calcium and PTH levels were normal.  Vitamin D was also normal. -She continues on 2000 units vitamin D daily-we will recheck this today.  -Latest calcium level from 08/2019 was reviewed and this was normal.  We will not repeat this today. -I will see her back in 1 year  2. Thyroid nodules -She denies neck compression symptoms, but at last visit she felt that her left thyroid nodule was fluctuating in size, and I suspect this may be due to possibly inflammation or fluid accumulation.  Of note, this nodule measured 2.4 cm and the biopsy was benign.  At this visit, she feels that this has increased in size and I agree (based on physical exam  today) -I reviewed the report of her most recent thyroid ultrasound from 2019: The dominant nodule is still large, but slightly smaller without change ultrasound characteristics -No intervention needed for now, we will continue to keep an eye on this  3.  Subclinical hypothyroidism -Her TSH increased at last visit and we had to start a low-dose levothyroxine - latest thyroid labs reviewed with pt >> normal: Lab Results  Component Value Date   TSH 2.270 08/13/2019   - she continues on LT4 88 mcg daily - pt feels good on this dose. - we discussed about taking the thyroid hormone every day, with water, >30 minutes before breakfast, separated by >4 hours from acid reflux medications, calcium, iron, multivitamins. Pt. is taking it correctly. - will check thyroid tests today: TSH and fT4 - If labs are abnormal, she will need to return for repeat TFTs in 1.5 months  4. OP  -Reviewed the report of the latest DXA scan from 2017: T-scores were worse -She refuses antiresorptive medicines and tries to manage this naturally.  She continues to exercise -At last visit, I we discussed about obtaining new DXA scan 2 years after her parathyroid surgery.I advised her to ask her OB/GYN doctor to order this on the same machine.  She did not get it yet.  Component     Latest  Ref Rng & Units 12/24/2019  VITD     30.00 - 100.00 ng/mL 45.55  TSH     0.35 - 4.50 uIU/mL 1.69  T4,Free(Direct)     0.60 - 1.60 ng/dL 1.17  Normal labs.  Philemon Kingdom, MD PhD [facility] Endocrinology

## 2019-12-24 NOTE — Patient Instructions (Addendum)
Please stop at the lab.  Please continue vitamin D 2000  Units daily.  Please continue levothyroxine 88 mcg daily.  Take the thyroid hormone every day, with water, at least 30 minutes before breakfast, separated by at least 4 hours from: - acid reflux medications - calcium - iron - multivitamins  Please have your ObGyn Dr. To order the bone density scan.  Please come back for a follow-up appointment in 1 year.

## 2019-12-25 LAB — TSH: TSH: 1.69 u[IU]/mL (ref 0.35–4.50)

## 2019-12-25 LAB — VITAMIN D 25 HYDROXY (VIT D DEFICIENCY, FRACTURES): VITD: 45.55 ng/mL (ref 30.00–100.00)

## 2019-12-25 LAB — T4, FREE: Free T4: 1.17 ng/dL (ref 0.60–1.60)

## 2020-01-13 ENCOUNTER — Ambulatory Visit: Payer: Medicare Other | Admitting: Internal Medicine

## 2020-01-20 DIAGNOSIS — Z01818 Encounter for other preprocedural examination: Secondary | ICD-10-CM | POA: Diagnosis not present

## 2020-01-20 DIAGNOSIS — H02831 Dermatochalasis of right upper eyelid: Secondary | ICD-10-CM | POA: Diagnosis not present

## 2020-01-20 DIAGNOSIS — H25812 Combined forms of age-related cataract, left eye: Secondary | ICD-10-CM | POA: Diagnosis not present

## 2020-01-20 DIAGNOSIS — H2511 Age-related nuclear cataract, right eye: Secondary | ICD-10-CM | POA: Diagnosis not present

## 2020-01-20 DIAGNOSIS — H3589 Other specified retinal disorders: Secondary | ICD-10-CM | POA: Diagnosis not present

## 2020-02-06 DIAGNOSIS — H2512 Age-related nuclear cataract, left eye: Secondary | ICD-10-CM | POA: Diagnosis not present

## 2020-02-06 DIAGNOSIS — H25812 Combined forms of age-related cataract, left eye: Secondary | ICD-10-CM | POA: Diagnosis not present

## 2020-03-05 DIAGNOSIS — H25812 Combined forms of age-related cataract, left eye: Secondary | ICD-10-CM | POA: Diagnosis not present

## 2020-03-05 DIAGNOSIS — H2513 Age-related nuclear cataract, bilateral: Secondary | ICD-10-CM | POA: Diagnosis not present

## 2020-03-05 DIAGNOSIS — H2511 Age-related nuclear cataract, right eye: Secondary | ICD-10-CM | POA: Diagnosis not present

## 2020-03-19 DIAGNOSIS — Z6820 Body mass index (BMI) 20.0-20.9, adult: Secondary | ICD-10-CM | POA: Diagnosis not present

## 2020-03-19 DIAGNOSIS — Z1231 Encounter for screening mammogram for malignant neoplasm of breast: Secondary | ICD-10-CM | POA: Diagnosis not present

## 2020-03-19 DIAGNOSIS — Z01419 Encounter for gynecological examination (general) (routine) without abnormal findings: Secondary | ICD-10-CM | POA: Diagnosis not present

## 2020-03-19 LAB — HM MAMMOGRAPHY

## 2020-03-30 ENCOUNTER — Encounter: Payer: Self-pay | Admitting: Family Medicine

## 2020-03-30 ENCOUNTER — Ambulatory Visit (INDEPENDENT_AMBULATORY_CARE_PROVIDER_SITE_OTHER): Payer: Medicare Other | Admitting: Family Medicine

## 2020-03-30 ENCOUNTER — Other Ambulatory Visit: Payer: Self-pay

## 2020-03-30 VITALS — BP 152/82 | HR 79 | Temp 98.1°F | Wt 123.2 lb

## 2020-03-30 DIAGNOSIS — Z9849 Cataract extraction status, unspecified eye: Secondary | ICD-10-CM | POA: Insufficient documentation

## 2020-03-30 DIAGNOSIS — L0390 Cellulitis, unspecified: Secondary | ICD-10-CM

## 2020-03-30 MED ORDER — BACTROBAN NASAL 2 % NA OINT: 1.0000 "application " | TOPICAL_OINTMENT | Freq: Two times a day (BID) | NASAL | 0 refills | Status: DC

## 2020-03-30 NOTE — Progress Notes (Signed)
   Subjective:    Patient ID: Shannon [name], female    DOB: [birth year], 69 y.o.   MRN: [id number]  HPI She notes a several day history of some swelling and slight discomfort to the left side of her nares.  No drainage or other lesions are noted. She also had bilateral cataract surgery approximately 1 month ago and notes definite visual improvement.  Review of Systems     Objective:   Physical Exam Exam of the left nares does show a healing lesion medially.  No erythema or tenderness is noted.  The right nares is normal.       Assessment & Plan:  Cellulitis, unspecified cellulitis site - Plan: mupirocin nasal ointment (BACTROBAN NASAL) 2 %  Status post cataract extraction, unspecified laterality I will treat it topically but if she has continued difficulty may consider giving her an oral antibiotic.  She was comfortable with that.

## 2020-04-01 ENCOUNTER — Telehealth: Payer: Self-pay | Admitting: Family Medicine

## 2020-04-01 MED ORDER — DOXYCYCLINE HYCLATE 100 MG PO TABS: 100.0000 mg | ORAL_TABLET | Freq: Two times a day (BID) | ORAL | 0 refills | Status: DC

## 2020-04-01 NOTE — Telephone Encounter (Signed)
I called in antibiotic in.  Have a let me know how it works

## 2020-04-01 NOTE — Telephone Encounter (Signed)
Pt called and the ointment has not helped at all.  Please call in antibiotic to [facility] on [address]. The tip hurts even more.

## 2020-04-08 ENCOUNTER — Other Ambulatory Visit: Payer: Self-pay

## 2020-04-08 ENCOUNTER — Telehealth: Payer: Self-pay | Admitting: Family Medicine

## 2020-04-08 DIAGNOSIS — J3489 Other specified disorders of nose and nasal sinuses: Secondary | ICD-10-CM

## 2020-04-08 NOTE — Telephone Encounter (Signed)
Pt called she is taking rx for sore in nose She states she is seeing no improvement at all and should she continue the medication  Please call or message with MyChart

## 2020-04-08 NOTE — Telephone Encounter (Signed)
Please advise if pt is still to continue on the medicine .

## 2020-04-08 NOTE — Telephone Encounter (Signed)
yes

## 2020-04-08 NOTE — Progress Notes (Signed)
amb  

## 2020-04-08 NOTE — Telephone Encounter (Signed)
See previous message

## 2020-04-08 NOTE — Telephone Encounter (Signed)
Pt advised Kh

## 2020-04-08 NOTE — Telephone Encounter (Signed)
Lets get her in with ENT  Dr Benjamine Mola since he is in the system

## 2020-04-14 DIAGNOSIS — H02831 Dermatochalasis of right upper eyelid: Secondary | ICD-10-CM | POA: Diagnosis not present

## 2020-04-14 DIAGNOSIS — H02834 Dermatochalasis of left upper eyelid: Secondary | ICD-10-CM | POA: Diagnosis not present

## 2020-04-15 ENCOUNTER — Ambulatory Visit: Payer: Self-pay | Admitting: Family Medicine

## 2020-04-17 ENCOUNTER — Telehealth: Payer: Self-pay | Admitting: Family Medicine

## 2020-04-17 MED ORDER — FLUCONAZOLE 150 MG PO TABS
150.0000 mg | ORAL_TABLET | Freq: Every day | ORAL | 0 refills | Status: DC
Start: 2020-04-17 — End: 2020-11-19

## 2020-04-17 NOTE — Telephone Encounter (Signed)
She has been on an antibiotic and is now seeing whitish spots in her mouth that are slightly uncomfortable.  I will give her Diflucan she will keep me informed.

## 2020-04-17 NOTE — Telephone Encounter (Signed)
Pt called and states that she has been treated for a nose issue. She has been on oral and topical antibiotics. She has a ENT appt in 9 days. This morning she has developed thrush in her month. She would like to know what she can do. Pt states she can't come in for an appt today. Please advise pt at [phone]. pt uses [facility] on [address]

## 2020-04-21 ENCOUNTER — Telehealth: Payer: Self-pay

## 2020-04-21 NOTE — Telephone Encounter (Signed)
Pt. Called stating that she was given 2 pills of diflucan after being on an antibiotic she got a yeast infection on her tongue. She said that its been five days since taking the medicine and it is almost all gone but still has a little bit of white on her tongue. She wants to know if she needs more medicine or is she ok.

## 2020-04-22 NOTE — Telephone Encounter (Signed)
I'd give it another few days.  Diflucan can take a little bit of time to work. Please advise

## 2020-04-22 NOTE — Telephone Encounter (Signed)
Pt is still having issues with thrush due to taking an abx . Please advise if she would need another script of diflucan to help clear or if she should give her previous script more time to work. Thank you [facility]

## 2020-04-23 DIAGNOSIS — H02831 Dermatochalasis of right upper eyelid: Secondary | ICD-10-CM | POA: Diagnosis not present

## 2020-04-23 DIAGNOSIS — H02834 Dermatochalasis of left upper eyelid: Secondary | ICD-10-CM | POA: Diagnosis not present

## 2020-05-01 DIAGNOSIS — J343 Hypertrophy of nasal turbinates: Secondary | ICD-10-CM | POA: Diagnosis not present

## 2020-05-01 DIAGNOSIS — J310 Chronic rhinitis: Secondary | ICD-10-CM | POA: Diagnosis not present

## 2020-05-01 DIAGNOSIS — J342 Deviated nasal septum: Secondary | ICD-10-CM | POA: Diagnosis not present

## 2020-05-18 ENCOUNTER — Other Ambulatory Visit: Payer: Self-pay | Admitting: Internal Medicine

## 2020-06-01 DIAGNOSIS — L814 Other melanin hyperpigmentation: Secondary | ICD-10-CM | POA: Diagnosis not present

## 2020-06-01 DIAGNOSIS — L819 Disorder of pigmentation, unspecified: Secondary | ICD-10-CM | POA: Diagnosis not present

## 2020-06-01 DIAGNOSIS — D229 Melanocytic nevi, unspecified: Secondary | ICD-10-CM | POA: Diagnosis not present

## 2020-06-01 DIAGNOSIS — L821 Other seborrheic keratosis: Secondary | ICD-10-CM | POA: Diagnosis not present

## 2020-06-01 DIAGNOSIS — L905 Scar conditions and fibrosis of skin: Secondary | ICD-10-CM | POA: Diagnosis not present

## 2020-06-01 DIAGNOSIS — L218 Other seborrheic dermatitis: Secondary | ICD-10-CM | POA: Diagnosis not present

## 2020-06-01 DIAGNOSIS — D1801 Hemangioma of skin and subcutaneous tissue: Secondary | ICD-10-CM | POA: Diagnosis not present

## 2020-06-17 DIAGNOSIS — J310 Chronic rhinitis: Secondary | ICD-10-CM | POA: Diagnosis not present

## 2020-06-17 DIAGNOSIS — J342 Deviated nasal septum: Secondary | ICD-10-CM | POA: Diagnosis not present

## 2020-06-17 DIAGNOSIS — J343 Hypertrophy of nasal turbinates: Secondary | ICD-10-CM | POA: Diagnosis not present

## 2020-06-18 ENCOUNTER — Other Ambulatory Visit: Payer: Medicare Other

## 2020-06-18 ENCOUNTER — Other Ambulatory Visit: Payer: Self-pay

## 2020-06-26 ENCOUNTER — Other Ambulatory Visit: Payer: Self-pay

## 2020-06-26 ENCOUNTER — Other Ambulatory Visit: Payer: Medicare Other

## 2020-07-04 DIAGNOSIS — Z23 Encounter for immunization: Secondary | ICD-10-CM | POA: Diagnosis not present

## 2020-08-17 ENCOUNTER — Ambulatory Visit: Payer: Medicare Other | Admitting: Family Medicine

## 2020-10-15 NOTE — Progress Notes (Unsigned)
[facility] [place] Phone: [phone] Subjective:   Shannon [name], am serving as a scribe for Dr. Hulan [name]. This visit occurred during the SARS-CoV-2 public health emergency.  Safety protocols were in place, including screening questions prior to the visit, additional usage of staff PPE, and extensive cleaning of exam room while observing appropriate contact time as indicated for disinfecting solutions.   I'm seeing this patient by the request  of:  Shannon Lung, MD  CC: Bilateral shoulder pain left greater than right  [id number]     Update 10/16/2020 Shannon [name] is a 70 y.o. female coming in with complaint of bilateral shoulder pain. Last seen for piriformis syndrome in 2016. Patient states that L>R. Pain over middle deltoid. Uses ice massage frequently. Also having left scapula pain. Patient states that she uses her arms a lot pulling weeds and lifting heavy items. Does have bilateral hand numbness at night when she wakes up.  Patient does not remember any true injury but has been very active.  Patient has history of right shoulder pain but that pain has improved.      Past Medical History:  Diagnosis Date  . Allergy    RHINTIS  . Asthma   . Fibrocystic disease of breast   . Hemorrhoids   . Hx of colonic polyps   . Menopause   . Mitral valve prolapse   . Mitral valve prolapse   . PONV (postoperative nausea and vomiting)    Pt asked to be careful with her teeth, due to them being brittle from low calcium  . Primary hyperparathyroidism ([place])    right  . Spinal headache    Past Surgical History:  Procedure Laterality Date  . APPENDECTOMY  1964  . COLONOSCOPY    . CYSTOSTOMY W/ BLADDER BIOPSY     Dr. Rosana [name]  . HEMORRHOID SURGERY     Dr. Truitt [name]  . NECK EXPLORATION  04/17/2017   with parathyroidectomy  . PARATHYROIDECTOMY Right 01/02/2017   Procedure: RIGHT INFERIOR PARATHYROIDECTOMY;   Surgeon: Shannon Gemma, MD;  Location: [facility];  Service: General;  Laterality: Right;  . PARATHYROIDECTOMY N/A 04/17/2017   Procedure: NECK EXPLORATION WITH PARATHYROIDECTOMY;  Surgeon: Shannon Gemma, MD;  Location: [facility];  Service: General;  Laterality: N/A;  . THUMB FUSION  1997   Dr Shannon [name]  . TONSILLECTOMY AND ADENOIDECTOMY    . tummy tuck     Social History   Socioeconomic History  . Marital status: Married    Spouse name: Not on file  . Number of children: 2  . Years of education: Not on file  . Highest education level: Not on file  Occupational History  . Occupation: retired  Tobacco Use  . Smoking status: Former Smoker    Packs/day: 0.25    Years: 10.00    Pack years: 2.50    Types: Cigarettes    Quit date: 06/04/1985    Years since quitting: 35.3  . Smokeless tobacco: Never Used  Vaping Use  . Vaping Use: Never used  Substance and Sexual Activity  . Alcohol use: Yes    Alcohol/week: 5.0 standard drinks    Types: 5 Glasses of wine per week  . Drug use: [name]  . Sexual activity: Yes  Other Topics Concern  . Not on file  Social History Narrative  . Not on file   Social Determinants of Health   Financial Resource Strain: Not on file  Food Insecurity: Not on file  Transportation Needs: Not on file  Physical Activity: Not on file  Stress: Not on file  Social Connections: Not on file   Allergies  Allergen Reactions  . Asa [Aspirin] Other (See Comments)    Minor bleeding   Family History  Problem Relation Age of Onset  . Cancer Father 74       [name] cancer  . Heart disease Father   . Hyperlipidemia Father   . Heart disease Mother 63       Sudden death presumed MI  . Heart disease Brother 77       Presumed CHF  . Heart disease Brother     Current Outpatient Medications (Endocrine & Metabolic):  .  levothyroxine (SYNTHROID) 88 MCG tablet, TAKE 1 TABLET(88 MCG) BY MOUTH DAILY BEFORE BREAKFAST   Current Outpatient Medications (Respiratory):  .  mupirocin nasal  ointment (BACTROBAN NASAL) 2 %, Place 1 application into the nose 2 (two) times daily. Use one-half of tube in each nostril twice daily for five (5) days. After application, press sides of nose together and gently massage. .  VENTOLIN HFA 108 (90 Base) MCG/ACT inhaler, INHALE 2 PUFFS INTO THE LUNGS EVERY 6 HOURS AS NEEDED FOR WHEEZING OR SHORTNESS OF BREATH   Current Outpatient Medications (Hematological):  .  vitamin B-12 (CYANOCOBALAMIN) 1000 MCG tablet, Take 1,000 mcg by mouth daily.  Current Outpatient Medications (Other):  Marland Kitchen  Cholecalciferol 50 MCG (2000 UT) CAPS, Take 2,000 Units by mouth daily. .  Coenzyme Q10 (CO Q-10) 100 MG CAPS, Take 100 mg by mouth daily. Marland Kitchen  doxycycline (VIBRA-TABS) 100 MG tablet, Take 1 tablet (100 mg total) by mouth 2 (two) times daily. .  fluconazole (DIFLUCAN) 150 MG tablet, Take 1 tablet (150 mg total) by mouth daily. .  Magnesium 250 MG TABS, Take 250 mg by mouth daily.    Reviewed prior external information including notes and imaging from  primary care provider As well as notes that were available from care everywhere and other healthcare systems.  Past medical history, social, surgical and family history all reviewed in electronic medical record.  [name] pertanent information unless stated regarding to the chief complaint.   Review of Systems:  [name] headache, visual changes, nausea, vomiting, diarrhea, constipation, dizziness, abdominal pain, skin rash, fevers, chills, night sweats, weight loss, swollen lymph nodes, body aches, joint swelling, chest pain, shortness of breath, mood changes. POSITIVE muscle aches  Objective  Blood pressure 134/84, pulse 81, height 5' 4.75" (1.645 m), weight 126 lb (57.2 kg), SpO2 97 %.   General: [name] apparent distress alert and oriented x3 mood and affect normal, dressed appropriately.  HEENT: Pupils equal, extraocular movements intact  Respiratory: Patient's speak in full sentences and does not appear short of breath   Cardiovascular: [name] lower extremity edema, non tender, [name] erythema  Neck exam very mild loss of lordosis but very good range of motion. Left shoulder exam though does have some mild atrophy of the musculature.  Patient has some weakness with 4 out of 5 strength of the rotator cuff.  Positive empty can sign.  Patient does have mild crepitus with range of motion that is decreasing internal and external.  Positive crossover test.  Limited musculoskeletal ultrasound was performed and interpreted by [name]  Limited ultrasound of patient's shoulder shows the patient does have a subdeltoid bursitis noted on the anterior aspect of the arm as well as the hypoechoic changes and chronic degenerative changes of the  bicep tendon.  Patient has what appears to be an anterior labral degenerative tearing noted with significant underlying arthritic changes of the joint.  Patient subscapularis appears to be intact with calcific changes but patient does have what appears to be a chronic tear of the supraspinatus.  Severe arthritic changes of the acromioclavicular joint noted Impression: Likely rotator cuff arthropathy    Impression and Recommendations:     The above documentation has been reviewed and is accurate and complete Judi Saa, DO

## 2020-10-16 ENCOUNTER — Ambulatory Visit: Payer: Self-pay

## 2020-10-16 ENCOUNTER — Ambulatory Visit (INDEPENDENT_AMBULATORY_CARE_PROVIDER_SITE_OTHER): Payer: Medicare Other

## 2020-10-16 ENCOUNTER — Other Ambulatory Visit: Payer: Self-pay

## 2020-10-16 ENCOUNTER — Encounter: Payer: Self-pay | Admitting: Family Medicine

## 2020-10-16 ENCOUNTER — Ambulatory Visit (INDEPENDENT_AMBULATORY_CARE_PROVIDER_SITE_OTHER): Payer: Medicare Other | Admitting: Family Medicine

## 2020-10-16 VITALS — BP 134/84 | HR 81 | Ht 64.75 in | Wt 126.0 lb

## 2020-10-16 DIAGNOSIS — M12812 Other specific arthropathies, not elsewhere classified, left shoulder: Secondary | ICD-10-CM

## 2020-10-16 DIAGNOSIS — M50323 Other cervical disc degeneration at C6-C7 level: Secondary | ICD-10-CM | POA: Diagnosis not present

## 2020-10-16 DIAGNOSIS — M75102 Unspecified rotator cuff tear or rupture of left shoulder, not specified as traumatic: Secondary | ICD-10-CM

## 2020-10-16 DIAGNOSIS — G8929 Other chronic pain: Secondary | ICD-10-CM

## 2020-10-16 DIAGNOSIS — M542 Cervicalgia: Secondary | ICD-10-CM | POA: Diagnosis not present

## 2020-10-16 DIAGNOSIS — M25511 Pain in right shoulder: Secondary | ICD-10-CM

## 2020-10-16 DIAGNOSIS — M25512 Pain in left shoulder: Secondary | ICD-10-CM

## 2020-10-16 DIAGNOSIS — M19012 Primary osteoarthritis, left shoulder: Secondary | ICD-10-CM | POA: Diagnosis not present

## 2020-10-16 DIAGNOSIS — M50322 Other cervical disc degeneration at C5-C6 level: Secondary | ICD-10-CM | POA: Diagnosis not present

## 2020-10-16 NOTE — Assessment & Plan Note (Signed)
Patient signs and symptoms is consistent with a rotator cuff arthropathy.  We discussed different medications which patient would like to avoid, we discussed potential steroid injections which patient wants to avoid as well.  Patient did have great success with PRP previously for the piriformis syndrome and would like to consider it.  We will do a leukocyte poor next year.  Patient will be set up next week.  Patient did have the history of the parathyroid and there was significant calcific changes.  Patient does not make significant improvement will consider advanced imaging but do not know if it would change management.

## 2020-10-16 NOTE — Patient Instructions (Signed)
Xray today PRP next week Add 1200mg  of tart cherry extract Increase Vit D to 2000IU

## 2020-10-21 NOTE — Progress Notes (Signed)
Shannon [name] Phone: [phone] Subjective:   Shannon [name], am serving as a scribe for Dr. Hulan Saas. This visit occurred during the SARS-CoV-2 public health emergency.  Safety protocols were in place, including screening questions prior to the visit, additional usage of staff PPE, and extensive cleaning of exam room while observing appropriate contact time as indicated for disinfecting solutions.   I'm seeing this patient by the request  of:  [name], MD  CC: Left shoulder pain follow-up  Shannon [name]   10/16/2020 Patient signs and symptoms is consistent with a rotator cuff arthropathy.  We discussed different medications which patient would like to avoid, we discussed potential steroid injections which patient wants to avoid as well.  Patient did have great success with PRP previously for the piriformis syndrome and would like to consider it.  We will do a leukocyte poor next year.  Patient will be set up next week.  Patient did have the history of the parathyroid and there was significant calcific changes.  Patient does not make significant improvement will consider advanced imaging but do not know if it would change management.   Update 10/22/2020 Shannon [name] is a 70 y.o. female coming in with complaint of left shoulder pain.  Patient on ultrasound was finding him signs and symptoms consistent with more of a rotator cuff arthropathy.  Patient though on x-ray only showed mild to moderate osteoarthritic changes of the glenohumeral and acromioclavicular joint.  Patient is here though for PRP with patient having such good relief of her piriformis syndrome previously years ago.       Past Medical History:  Diagnosis Date  . Allergy    RHINTIS  . Asthma   . Fibrocystic disease of breast   . Hemorrhoids   . Hx of colonic polyps   . Menopause   . Mitral valve prolapse   . Mitral valve prolapse   .  PONV (postoperative nausea and vomiting)    Pt asked to be careful with her teeth, due to them being brittle from low calcium  . Primary hyperparathyroidism ([place])    right  . Spinal headache    Past Surgical History:  Procedure Laterality Date  . APPENDECTOMY  1964  . COLONOSCOPY    . CYSTOSTOMY W/ BLADDER BIOPSY     Dr. Rosana Hoes  . HEMORRHOID SURGERY     Dr. Truitt Leep  . NECK EXPLORATION  04/17/2017   with parathyroidectomy  . PARATHYROIDECTOMY Right 01/02/2017   Procedure: RIGHT INFERIOR PARATHYROIDECTOMY;  Surgeon: Armandina Gemma, MD;  Location: [facility];  Service: General;  Laterality: Right;  . PARATHYROIDECTOMY N/A 04/17/2017   Procedure: NECK EXPLORATION WITH PARATHYROIDECTOMY;  Surgeon: Armandina Gemma, MD;  Location: [facility];  Service: General;  Laterality: N/A;  . THUMB FUSION  1997   Dr Maxie Better  . TONSILLECTOMY AND ADENOIDECTOMY    . tummy tuck     Social History   Socioeconomic History  . Marital status: Married    Spouse name: Not on file  . Number of children: 2  . Years of education: Not on file  . Highest education level: Not on file  Occupational History  . Occupation: retired  Tobacco Use  . Smoking status: Former Smoker    Packs/day: 0.25    Years: 10.00    Pack years: 2.50    Types: Cigarettes    Quit date: 06/04/1985    Years since quitting: 35.4  .  Smokeless tobacco: Never Used  Vaping Use  . Vaping Use: Never used  Substance and Sexual Activity  . Alcohol use: Yes    Alcohol/week: 5.0 standard drinks    Types: 5 Glasses of wine per week  . Drug use: [name]  . Sexual activity: Yes  Other Topics Concern  . Not on file  Social History Narrative  . Not on file   Social Determinants of Health   Financial Resource Strain: Not on file  Food Insecurity: Not on file  Transportation Needs: Not on file  Physical Activity: Not on file  Stress: Not on file  Social Connections: Not on file   Allergies  Allergen Reactions  . Asa [Aspirin] Other (See Comments)     Minor bleeding   Family History  Problem Relation Age of Onset  . Cancer Father 62       Lung cancer  . Heart disease Father   . Hyperlipidemia Father   . Heart disease Mother 50       Sudden death presumed MI  . Heart disease Brother 63       Presumed CHF  . Heart disease Brother     Current Outpatient Medications (Endocrine & Metabolic):  .  levothyroxine (SYNTHROID) 88 MCG tablet, TAKE 1 TABLET(88 MCG) BY MOUTH DAILY BEFORE BREAKFAST   Current Outpatient Medications (Respiratory):  Marland Kitchen  VENTOLIN HFA 108 (90 Base) MCG/ACT inhaler, INHALE 2 PUFFS INTO THE LUNGS EVERY 6 HOURS AS NEEDED FOR WHEEZING OR SHORTNESS OF BREATH .  mupirocin nasal ointment (BACTROBAN NASAL) 2 %, Place 1 application into the nose 2 (two) times daily. Use one-half of tube in each nostril twice daily for five (5) days. After application, press sides of nose together and gently massage.   Current Outpatient Medications (Hematological):  .  vitamin B-12 (CYANOCOBALAMIN) 1000 MCG tablet, Take 1,000 mcg by mouth daily.  Current Outpatient Medications (Other):  Marland Kitchen  Cholecalciferol 50 MCG (2000 UT) CAPS, Take 2,000 Units by mouth daily. .  Coenzyme Q10 (CO Q-10) 100 MG CAPS, Take 100 mg by mouth daily. .  Magnesium 250 MG TABS, Take 250 mg by mouth daily.  Marland Kitchen  doxycycline (VIBRA-TABS) 100 MG tablet, Take 1 tablet (100 mg total) by mouth 2 (two) times daily. .  fluconazole (DIFLUCAN) 150 MG tablet, Take 1 tablet (150 mg total) by mouth daily.   Reviewed prior external information including notes and imaging from  primary care provider As well as notes that were available from care everywhere and other healthcare systems.  Past medical history, social, surgical and family history all reviewed in electronic medical record.  [name] pertanent information unless stated regarding to the chief complaint.   Review of Systems:  [name] headache, visual changes, nausea, vomiting, diarrhea, constipation, dizziness, abdominal pain,  skin rash, fevers, chills, night sweats, weight loss, swollen lymph nodes, body aches, joint swelling, chest pain, shortness of breath, mood changes. POSITIVE muscle aches  Objective  Blood pressure 124/82, pulse 79, height 5' 4.75" (1.645 m), weight 126 lb (57.2 kg), SpO2 98 %.   General: [name] apparent distress alert and oriented x3 mood and affect normal, dressed appropriately.  HEENT: Pupils equal, extraocular movements intact  Respiratory: Patient's speak in full sentences and does not appear short of breath  Cardiovascular: [name] lower extremity edema, non tender, [name] erythema  Left shoulder exam does have some mild atrophy of the musculature.  Patient does have mild crepitus with range of motion.  Rotator cuff strength 4 out of  5.  Patient does have positive crossover as well.  Procedure: Real-time Ultrasound Guided Injection of left acromioclavicular joint Device: GE Logiq Q7 Ultrasound guided injection is preferred based studies that show increased duration, increased effect, greater accuracy, decreased procedural pain, increased response rate, and decreased cost with ultrasound guided versus blind injection.  Verbal informed consent obtained.  Time-out conducted.  Noted [name] overlying erythema, induration, or other signs of local infection.  Skin prepped in a sterile fashion.  Local anesthesia: Topical Ethyl chloride.  With sterile technique and under real time ultrasound guidance: With a 25-gauge half inch needle injected with 0.5 cc of 0.5% Marcaine and injected with 1 cc of 3 centrifuge PRP Completed without difficulty  Pain immediately resolved suggesting accurate placement of the medication.  Advised to call if fevers/chills, erythema, induration, drainage, or persistent bleeding.  Impression: Technically successful ultrasound guided injection.  Procedure: Real-time Ultrasound Guided Injection of left glenohumeral joint Device: GE Logiq E  Ultrasound guided injection is preferred  based studies that show increased duration, increased effect, greater accuracy, decreased procedural pain, increased response rate with ultrasound guided versus blind injection.  Verbal informed consent obtained.  Time-out conducted.  Noted [name] overlying erythema, induration, or other signs of local infection.  Skin prepped in a sterile fashion.  Local anesthesia: Topical Ethyl chloride.  With sterile technique and under real time ultrasound guidance:  Joint visualized.  21g 2 inch needle inserted posterior approach.  Patient was given injection of 1 cc of 0.5% Marcaine and then injected with 5 cc of 3 centrifuge PRP Completed without difficulty  Pain immediately resolved suggesting accurate placement of the medication.  Advised to call if fevers/chills, erythema, induration, drainage, or persistent bleeding.  Impression: Technically successful ultrasound guided injection.    Impression and Recommendations:     The above documentation has been reviewed and is accurate and complete Judi Saa, DO

## 2020-10-22 ENCOUNTER — Other Ambulatory Visit: Payer: Self-pay

## 2020-10-22 ENCOUNTER — Ambulatory Visit: Payer: Self-pay | Admitting: Family Medicine

## 2020-10-22 ENCOUNTER — Ambulatory Visit: Payer: Self-pay

## 2020-10-22 ENCOUNTER — Encounter: Payer: Self-pay | Admitting: Family Medicine

## 2020-10-22 VITALS — BP 124/82 | HR 79 | Ht 64.75 in | Wt 126.0 lb

## 2020-10-22 DIAGNOSIS — G8929 Other chronic pain: Secondary | ICD-10-CM

## 2020-10-22 DIAGNOSIS — M12812 Other specific arthropathies, not elsewhere classified, left shoulder: Secondary | ICD-10-CM

## 2020-10-22 DIAGNOSIS — M25512 Pain in left shoulder: Secondary | ICD-10-CM

## 2020-10-22 DIAGNOSIS — M75102 Unspecified rotator cuff tear or rupture of left shoulder, not specified as traumatic: Secondary | ICD-10-CM

## 2020-10-22 NOTE — Patient Instructions (Signed)
See me again in 4-6 weeks  

## 2020-10-22 NOTE — Assessment & Plan Note (Signed)
Patient did have PRP done.  Patient responded relatively well and had an improvement in range of motion.  Discussed with patient in great length.  Because of the mild discrepancy between the ultrasound with the rotator cuff arthropathy and the x-ray showing mild osteoarthritic changes we did do the leukocyte poor mixture.  I do think that there is a potential for the leukocyte rich for more of the tendon if necessary in the future.  We did discuss that with no significant improvement in the next 4 weeks I would consider MRI before we do another injection.  Patient is in agreement with the plan and we will see her in 4 weeks

## 2020-11-05 ENCOUNTER — Telehealth: Payer: Self-pay | Admitting: Family Medicine

## 2020-11-05 NOTE — Telephone Encounter (Signed)
PRP shoulder done 1/13, she feels great BUT her shoulder hurts a bit after PT. She wants to be sure it is OK to ice after PT as she does not want to do anything that may hurt her PRP.  OK to respond via MyChart

## 2020-11-05 NOTE — Telephone Encounter (Signed)
Sent patient a message via [facility]. Ok to ice after PT sessions.

## 2020-11-18 NOTE — Progress Notes (Unsigned)
[name] [place] Phone: [phone] Subjective:   Shannon [name], am serving as a scribe for Dr. Hulan Saas.  This visit occurred during the SARS-CoV-2 public health emergency.  Safety protocols were in place, including screening questions prior to the visit, additional usage of staff PPE, and extensive cleaning of exam room while observing appropriate contact time as indicated for disinfecting solutions.   I'm seeing this patient by the request  of:  [name], MD  CC:   [id number]   10/22/2020 Patient did have PRP done.  Patient responded relatively well and had an improvement in range of motion.  Discussed with patient in great length.  Because of the mild discrepancy between the ultrasound with the rotator cuff arthropathy and the x-ray showing mild osteoarthritic changes we did do the leukocyte poor mixture.  I do think that there is a potential for the leukocyte rich for more of the tendon if necessary in the future.  We did discuss that with no significant improvement in the next 4 weeks I would consider MRI before we do another injection.  Patient is in agreement with the plan and we will see her in 4 weeks  Update 11/19/2020 Shannon [name] is a 70 y.o. female coming in with complaint of left shoulder pain.  Patient did have a PRP done of the shoulder.  Patient has been doing the return to activity protocol.  We did have discrepancy between the ultrasound and the x-rays.  Patient states that her shoulder is doing great and is able to do things that she was not able to do before. The numbness in her L hand is gone but the R hand still gets numb at night. Patient is ready to start working on the right shoulder.       Past Medical History:  Diagnosis Date  . Allergy    RHINTIS  . Asthma   . Fibrocystic disease of breast   . Hemorrhoids   . Hx of colonic polyps   . Menopause   . Mitral valve prolapse    . Mitral valve prolapse   . PONV (postoperative nausea and vomiting)    Pt asked to be careful with her teeth, due to them being brittle from low calcium  . Primary hyperparathyroidism ([place])    right  . Spinal headache    Past Surgical History:  Procedure Laterality Date  . APPENDECTOMY  1964  . COLONOSCOPY    . CYSTOSTOMY W/ BLADDER BIOPSY     Dr. Rosana Hoes  . HEMORRHOID SURGERY     Dr. Truitt Leep  . NECK EXPLORATION  04/17/2017   with parathyroidectomy  . PARATHYROIDECTOMY Right 01/02/2017   Procedure: RIGHT INFERIOR PARATHYROIDECTOMY;  Surgeon: Armandina Gemma, MD;  Location: [facility];  Service: General;  Laterality: Right;  . PARATHYROIDECTOMY N/A 04/17/2017   Procedure: NECK EXPLORATION WITH PARATHYROIDECTOMY;  Surgeon: Armandina Gemma, MD;  Location: [place];  Service: General;  Laterality: N/A;  . THUMB FUSION  1997   Dr Maxie Better  . TONSILLECTOMY AND ADENOIDECTOMY    . tummy tuck     Social History   Socioeconomic History  . Marital status: Married    Spouse name: Not on file  . Number of children: 2  . Years of education: Not on file  . Highest education level: Not on file  Occupational History  . Occupation: retired  Tobacco Use  . Smoking status: Former Smoker  Packs/day: 0.25    Years: 10.00    Pack years: 2.50    Types: Cigarettes    Quit date: 06/04/1985    Years since quitting: 35.4  . Smokeless tobacco: Never Used  Vaping Use  . Vaping Use: Never used  Substance and Sexual Activity  . Alcohol use: Yes    Alcohol/week: 5.0 standard drinks    Types: 5 Glasses of wine per week  . Drug use: No  . Sexual activity: Yes  Other Topics Concern  . Not on file  Social History Narrative  . Not on file   Social Determinants of Health   Financial Resource Strain: Not on file  Food Insecurity: Not on file  Transportation Needs: Not on file  Physical Activity: Not on file  Stress: Not on file  Social Connections: Not on file   Allergies  Allergen Reactions  . Asa  [Aspirin] Other (See Comments)    Minor bleeding   Family History  Problem Relation Age of Onset  . Cancer Father 50       Lung cancer  . Heart disease Father   . Hyperlipidemia Father   . Heart disease Mother 33       Sudden death presumed MI  . Heart disease Brother 31       Presumed CHF  . Heart disease Brother     Current Outpatient Medications (Endocrine & Metabolic):  .  levothyroxine (SYNTHROID) 88 MCG tablet, TAKE 1 TABLET(88 MCG) BY MOUTH DAILY BEFORE BREAKFAST   Current Outpatient Medications (Respiratory):  Marland Kitchen  VENTOLIN HFA 108 (90 Base) MCG/ACT inhaler, INHALE 2 PUFFS INTO THE LUNGS EVERY 6 HOURS AS NEEDED FOR WHEEZING OR SHORTNESS OF BREATH   Current Outpatient Medications (Hematological):  .  vitamin B-12 (CYANOCOBALAMIN) 1000 MCG tablet, Take 1,000 mcg by mouth daily.  Current Outpatient Medications (Other):  Marland Kitchen  Cholecalciferol 50 MCG (2000 UT) CAPS, Take 2,000 Units by mouth daily. .  Coenzyme Q10 (CO Q-10) 100 MG CAPS, Take 100 mg by mouth daily. .  Magnesium 250 MG TABS, Take 250 mg by mouth daily.    Reviewed prior external information including notes and imaging from  primary care provider As well as notes that were available from care everywhere and other healthcare systems.  Past medical history, social, surgical and family history all reviewed in electronic medical record.  No pertanent information unless stated regarding to the chief complaint.   Review of Systems:  No headache, visual changes, nausea, vomiting, diarrhea, constipation, dizziness, abdominal pain, skin rash, fevers, chills, night sweats, weight loss, swollen lymph nodes, body aches, joint swelling, chest pain, shortness of breath, mood changes. POSITIVE muscle aches  Objective  Blood pressure 138/80, pulse 77, height 5' 4.75" (1.645 m), weight 125 lb (56.7 kg), SpO2 99 %.   General: No apparent distress alert and oriented x3 mood and affect normal, dressed appropriately.  HEENT:  Pupils equal, extraocular movements intact  Respiratory: Patient's speak in full sentences and does not appear short of breath  Cardiovascular: No lower extremity edema, non tender, no erythema  Gait normal with good balance and coordination.  MSK:   Patient's left shoulder has significant improvement in range of motion.  5 of 5 strength of the rotator cuff noted which is a significant improvement.  Contralateral side has positive impingement noted.  Mild positive crossover.  Neck exam unremarkable.  Limited musculoskeletal ultrasound was performed and interpreted by [name]  Limited musculoskeletal ultrasound of the patient's left shoulder  shows patient still has calcific changes and does have a bursitis noted that seems to be a cellulitic deltoid on the lateral aspect as well as the anterior 1 on the subscapularis.  Supraspinatus still has significant calcific changes and likely does still have some intrasubstance tearing but does have significant decrease of hypoechoic changes from previous exam.  Due to the patient's contralateral side that has become larger subacromial bursitis noted.  Significant arthritic changes of the acromioclavicular joint with a joint effusion on the right   Impression and Recommendations:     The above documentation has been reviewed and is accurate and complete [name], DO

## 2020-11-19 ENCOUNTER — Other Ambulatory Visit: Payer: Self-pay

## 2020-11-19 ENCOUNTER — Encounter: Payer: Self-pay | Admitting: Family Medicine

## 2020-11-19 ENCOUNTER — Ambulatory Visit (INDEPENDENT_AMBULATORY_CARE_PROVIDER_SITE_OTHER): Payer: Medicare Other | Admitting: Family Medicine

## 2020-11-19 ENCOUNTER — Ambulatory Visit: Payer: Self-pay

## 2020-11-19 ENCOUNTER — Ambulatory Visit (INDEPENDENT_AMBULATORY_CARE_PROVIDER_SITE_OTHER): Payer: Medicare Other

## 2020-11-19 VITALS — BP 138/80 | HR 77 | Ht 64.75 in | Wt 125.0 lb

## 2020-11-19 DIAGNOSIS — M12812 Other specific arthropathies, not elsewhere classified, left shoulder: Secondary | ICD-10-CM

## 2020-11-19 DIAGNOSIS — M75102 Unspecified rotator cuff tear or rupture of left shoulder, not specified as traumatic: Secondary | ICD-10-CM

## 2020-11-19 DIAGNOSIS — G8929 Other chronic pain: Secondary | ICD-10-CM

## 2020-11-19 DIAGNOSIS — M7551 Bursitis of right shoulder: Secondary | ICD-10-CM

## 2020-11-19 DIAGNOSIS — M25511 Pain in right shoulder: Secondary | ICD-10-CM

## 2020-11-19 DIAGNOSIS — M25519 Pain in unspecified shoulder: Secondary | ICD-10-CM | POA: Insufficient documentation

## 2020-11-19 DIAGNOSIS — M25512 Pain in left shoulder: Secondary | ICD-10-CM | POA: Diagnosis not present

## 2020-11-19 NOTE — Assessment & Plan Note (Signed)
Patient has done significantly better since the PRP.  Has been 1 month.  Has full range of motion and grip strength.  On ultrasound patient still does have though significant bursitis as well as calcific changes.  Past medical history significant for hyperparathyroidism.  Discussed following up with primary care for that.  Follow-up with me again when we see her for PRP on the contralateral side

## 2020-11-19 NOTE — Assessment & Plan Note (Signed)
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  I do believe that this is contributing to some of the pain on the right now.  Patient had significant improvement with the PRP on the contralateral side.  Patient would like to try PRP on the right side as well.  We will get x-rays today to further evaluate but then patient should do well and see me again for PRP on the right side.

## 2020-11-19 NOTE — Progress Notes (Signed)
[place] Phone: [phone] Subjective:   Shannon [name], am serving as a scribe for Dr. Hulan Saas. This visit occurred during the SARS-CoV-2 public health emergency.  Safety protocols were in place, including screening questions prior to the visit, additional usage of staff PPE, and extensive cleaning of exam room while observing appropriate contact time as indicated for disinfecting solutions.   I'm seeing this patient by the request  of:  [name], MD  CC: Right shoulder pain  [id number]  Shannon [name] is a 70 y.o. female coming in with complaint of right shoulder pain. Is here for PRP today. Has had PRP injection for left shoulder. Patient states that her left shoulder has improved since injection. Right shoulder her bothers her with overuse.  Patient states that is continuing to have pain on the right shoulder at this time.  Here for PRP     Past Medical History:  Diagnosis Date  . Allergy    RHINTIS  . Asthma   . Fibrocystic disease of breast   . Hemorrhoids   . Hx of colonic polyps   . Menopause   . Mitral valve prolapse   . Mitral valve prolapse   . PONV (postoperative nausea and vomiting)    Pt asked to be careful with her teeth, due to them being brittle from low calcium  . Primary hyperparathyroidism ([place])    right  . Spinal headache    Past Surgical History:  Procedure Laterality Date  . APPENDECTOMY  1964  . COLONOSCOPY    . CYSTOSTOMY W/ BLADDER BIOPSY     Dr. Rosana Hoes  . HEMORRHOID SURGERY     Dr. Truitt Leep  . NECK EXPLORATION  04/17/2017   with parathyroidectomy  . PARATHYROIDECTOMY Right 01/02/2017   Procedure: RIGHT INFERIOR PARATHYROIDECTOMY;  Surgeon: Armandina Gemma, MD;  Location: [place];  Service: General;  Laterality: Right;  . PARATHYROIDECTOMY N/A 04/17/2017   Procedure: NECK EXPLORATION WITH PARATHYROIDECTOMY;  Surgeon: Armandina Gemma, MD;  Location: [place];  Service:  General;  Laterality: N/A;  . THUMB FUSION  1997   Dr Maxie Better  . TONSILLECTOMY AND ADENOIDECTOMY    . tummy tuck     Social History   Socioeconomic History  . Marital status: Married    Spouse name: Not on file  . Number of children: 2  . Years of education: Not on file  . Highest education level: Not on file  Occupational History  . Occupation: retired  Tobacco Use  . Smoking status: Former Smoker    Packs/day: 0.25    Years: 10.00    Pack years: 2.50    Types: Cigarettes    Quit date: 06/04/1985    Years since quitting: 35.4  . Smokeless tobacco: Never Used  Vaping Use  . Vaping Use: Never used  Substance and Sexual Activity  . Alcohol use: Yes    Alcohol/week: 5.0 standard drinks    Types: 5 Glasses of wine per week  . Drug use: [name]  . Sexual activity: Yes  Other Topics Concern  . Not on file  Social History Narrative  . Not on file   Social Determinants of Health   Financial Resource Strain: Not on file  Food Insecurity: Not on file  Transportation Needs: Not on file  Physical Activity: Not on file  Stress: Not on file  Social Connections: Not on file   Allergies  Allergen Reactions  . Diona Fanti [  Aspirin] Other (See Comments)    Minor bleeding   Family History  Problem Relation Age of Onset  . Cancer Father 32       Lung cancer  . Heart disease Father   . Hyperlipidemia Father   . Heart disease Mother 8       Sudden death presumed MI  . Heart disease Brother 22       Presumed CHF  . Heart disease Brother     Current Outpatient Medications (Endocrine & Metabolic):  .  levothyroxine (SYNTHROID) 88 MCG tablet, TAKE 1 TABLET(88 MCG) BY MOUTH DAILY BEFORE BREAKFAST   Current Outpatient Medications (Respiratory):  Marland Kitchen  VENTOLIN HFA 108 (90 Base) MCG/ACT inhaler, INHALE 2 PUFFS INTO THE LUNGS EVERY 6 HOURS AS NEEDED FOR WHEEZING OR SHORTNESS OF BREATH   Current Outpatient Medications (Hematological):  .  vitamin B-12 (CYANOCOBALAMIN) 1000 MCG tablet, Take  1,000 mcg by mouth daily.  Current Outpatient Medications (Other):  Marland Kitchen  Cholecalciferol 50 MCG (2000 UT) CAPS, Take 2,000 Units by mouth daily. .  Coenzyme Q10 (CO Q-10) 100 MG CAPS, Take 100 mg by mouth daily. .  Magnesium 250 MG TABS, Take 250 mg by mouth daily.    Reviewed prior external information including notes and imaging from  primary care provider As well as notes that were available from care everywhere and other healthcare systems.  Past medical history, social, surgical and family history all reviewed in electronic medical record.  [name] pertanent information unless stated regarding to the chief complaint.   Review of Systems:  [name] headache, visual changes, nausea, vomiting, diarrhea, constipation, dizziness, abdominal pain, skin rash, fevers, chills, night sweats, weight loss, swollen lymph nodes, body aches, joint swelling, chest pain, shortness of breath, mood changes. POSITIVE muscle aches  Objective  Blood pressure (!) 142/90, pulse 71, height 5\' 4"  (1.626 m), weight 126 lb (57.2 kg), SpO2 97 %.   General: [name] apparent distress alert and oriented x3 mood and affect normal, dressed appropriately.  HEENT: Pupils equal, extraocular movements intact  Respiratory: Patient's speak in full sentences and does not appear short of breath  Cardiovascular: [name] lower extremity edema, non tender, [name] erythema  Right shoulder exam shows the patient does still have some mild swelling noted of the subacromial space.  Patient does have mild positive crossover as well as mild positive impingement signs noted.  Procedure: Real-time Ultrasound Guided Injection of right glenohumeral joint Device: GE Logiq Q7  Ultrasound guided injection is preferred based studies that show increased duration, increased effect, greater accuracy, decreased procedural pain, increased response rate with ultrasound guided versus blind injection.  Verbal informed consent obtained.  Time-out conducted.  Noted [name]  overlying erythema, induration, or other signs of local infection.  Skin prepped in a sterile fashion.  Local anesthesia: Topical Ethyl chloride.  With sterile technique and under real time ultrasound guidance:  Joint visualized.  23g 1  inch needle inserted posterior approach. Pictures taken for needle placement. Patient did have injection of 1 cc of 0.5% Marcaine in the subacromial space secondary to the amount of fluid in the area and did have aspiration of 10 cc of straw-colored colored fluid then injected with 5 cc of 3 centrifuge PRP over the supraspinatus tendon.  Pain immediately resolved suggesting accurate placement of the medication.  Advised to call if fevers/chills, erythema, induration, drainage, or persistent bleeding.  Impression: Technically successful ultrasound guided injection.    Impression and Recommendations:     The above documentation has  been reviewed and is accurate and complete [name], DO

## 2020-11-19 NOTE — Patient Instructions (Addendum)
Good to see you  Left shoulder looks much better Lets set you up for PRP on R side at that time I will drain the bursa and inject the PRP See me again at my first available (cancellations on the 15th)

## 2020-11-19 NOTE — Assessment & Plan Note (Signed)
Significant bursitis with some calcific changes noted.  Patient has great strength though.  Ultrasound did show some degenerative changes of the rotator cuff significant arthritic change of the acromioclavicular joint.  Follow-up again with me for the PRP based on what patient would like in the improvement follow-up again in the near future

## 2020-11-24 ENCOUNTER — Ambulatory Visit: Payer: Self-pay | Admitting: Family Medicine

## 2020-11-24 ENCOUNTER — Encounter: Payer: Self-pay | Admitting: Family Medicine

## 2020-11-24 ENCOUNTER — Ambulatory Visit: Payer: Self-pay

## 2020-11-24 ENCOUNTER — Other Ambulatory Visit: Payer: Self-pay

## 2020-11-24 VITALS — BP 142/90 | HR 71 | Ht 64.0 in | Wt 126.0 lb

## 2020-11-24 DIAGNOSIS — M25511 Pain in right shoulder: Secondary | ICD-10-CM

## 2020-11-24 DIAGNOSIS — M2550 Pain in unspecified joint: Secondary | ICD-10-CM | POA: Diagnosis not present

## 2020-11-24 DIAGNOSIS — M7551 Bursitis of right shoulder: Secondary | ICD-10-CM

## 2020-11-24 DIAGNOSIS — G8929 Other chronic pain: Secondary | ICD-10-CM

## 2020-11-24 NOTE — Assessment & Plan Note (Signed)
Patient given injection.  Tolerated the procedure well of the PRP.  We warned the patient about potential side effects.  Patient decided on the contralateral side.  Patient wanted to avoid any type of the steroids at this time.  Patient does have a history of the parathyroidectomy that does cause some of the imbalances of the calcium.  I do think that that is contributing as well.  We will follow up again in 4 weeks to see how patient is responding.

## 2020-11-24 NOTE — Patient Instructions (Addendum)
No ice or IBU for 3 days Heat is ok as is Tylenol See me again in 4 weeks

## 2020-11-25 ENCOUNTER — Encounter: Payer: Self-pay | Admitting: Family Medicine

## 2020-11-25 LAB — CELL COUNT + DIFF, W/O CRYST-SYNVL FLD
Basophils, %: 0 %
Eosinophils-Synovial: 0 % (ref 0–2)
Lymphocytes-Synovial Fld: 81 % — ABNORMAL HIGH (ref 0–74)
Monocyte/Macrophage: 0 % (ref 0–69)
Neutrophil, Synovial: 16 % (ref 0–24)
Synoviocytes, %: 3 % (ref 0–15)
WBC, Synovial: 382 cells/uL — ABNORMAL HIGH (ref <150)

## 2020-11-26 ENCOUNTER — Other Ambulatory Visit: Payer: Self-pay | Admitting: Family Medicine

## 2020-11-26 ENCOUNTER — Encounter: Payer: Self-pay | Admitting: Family Medicine

## 2020-11-26 ENCOUNTER — Ambulatory Visit (INDEPENDENT_AMBULATORY_CARE_PROVIDER_SITE_OTHER): Payer: Medicare Other | Admitting: Family Medicine

## 2020-11-26 ENCOUNTER — Other Ambulatory Visit: Payer: Self-pay

## 2020-11-26 VITALS — BP 110/74 | HR 75 | Temp 97.4°F | Ht 64.5 in | Wt 123.6 lb

## 2020-11-26 DIAGNOSIS — Z9849 Cataract extraction status, unspecified eye: Secondary | ICD-10-CM

## 2020-11-26 DIAGNOSIS — M75102 Unspecified rotator cuff tear or rupture of left shoulder, not specified as traumatic: Secondary | ICD-10-CM

## 2020-11-26 DIAGNOSIS — E039 Hypothyroidism, unspecified: Secondary | ICD-10-CM | POA: Diagnosis not present

## 2020-11-26 DIAGNOSIS — F410 Panic disorder [episodic paroxysmal anxiety] without agoraphobia: Secondary | ICD-10-CM

## 2020-11-26 DIAGNOSIS — M810 Age-related osteoporosis without current pathological fracture: Secondary | ICD-10-CM

## 2020-11-26 DIAGNOSIS — M7551 Bursitis of right shoulder: Secondary | ICD-10-CM

## 2020-11-26 DIAGNOSIS — E892 Postprocedural hypoparathyroidism: Secondary | ICD-10-CM | POA: Diagnosis not present

## 2020-11-26 DIAGNOSIS — J4520 Mild intermittent asthma, uncomplicated: Secondary | ICD-10-CM

## 2020-11-26 DIAGNOSIS — E042 Nontoxic multinodular goiter: Secondary | ICD-10-CM | POA: Diagnosis not present

## 2020-11-26 DIAGNOSIS — M12812 Other specific arthropathies, not elsewhere classified, left shoulder: Secondary | ICD-10-CM

## 2020-11-26 DIAGNOSIS — Z13220 Encounter for screening for lipoid disorders: Secondary | ICD-10-CM | POA: Diagnosis not present

## 2020-11-26 MED ORDER — ALBUTEROL SULFATE HFA 108 (90 BASE) MCG/ACT IN AERS: INHALATION_SPRAY | RESPIRATORY_TRACT | 0 refills | Status: DC

## 2020-11-26 NOTE — Addendum Note (Signed)
Addended by: [name] on: 11/26/2020 10:58 AM   Modules accepted: Orders

## 2020-11-26 NOTE — Patient Instructions (Signed)
  Shannon [name] , Thank you for taking time to come for your Medicare Wellness Visit. I appreciate your ongoing commitment to your health goals. Please review the following plan we discussed and let me know if I can assist you in the future.   These are the goals we discussed: Goals   None     This is a list of the screening recommended for you and due dates:  Health Maintenance  Topic Date Due  . Mammogram  05/17/2014  . Tetanus Vaccine  07/01/2019  . Flu Shot  05/10/2020  . COVID-19 Vaccine (3 - Booster for Pfizer series) 05/17/2020  . Pneumonia vaccines (2 of 2 - PPSV23) 06/26/2021  . Cologuard (Stool DNA test)  10/16/2021  . DEXA scan (bone density measurement)  Completed  .  Hepatitis C: One time screening is recommended by Center for Disease Control  (CDC) for  adults born from [birth year] through 1965.   Completed

## 2020-11-26 NOTE — Progress Notes (Addendum)
Shannon [name] is a 70 y.o. female who presents for annual wellness visit and follow-up on chronic medical conditions.  She has had difficulty recently with shoulder discomfort.  She is in the process of getting PRP injections into her shoulders and she states that it is helping a lot.  She has a previous history of panic attacks but states that she seems be doing quite nicely at the present time.  She does have mild intermittent asthma but presently not taking any medications.  She does have a history of osteoporosis which is probably secondary to hyperparathyroid.  She has not been on any bisphosphonates stating she is concerned about possible side effects.  She is on taking her thyroid medicine.  She does have a history of hyper parathyroidectomy and does see endocrinology.  She is also taking Synthroid without difficulty.  Otherwise she has no particular concerns or complaints.  Immunizations and Health Maintenance Immunization History  Administered Date(s) Administered  . DTaP 04/17/1998  . Fluad Quad(high Dose 65+) 06/06/2019  . Influenza Split 10/06/2011, 07/21/2015  . Influenza, High Dose Seasonal PF 07/01/2020  . Influenza,inj,Quad PF,6+ Mos 06/05/2017, 06/26/2018  . PFIZER(Purple Top)SARS-COV-2 Vaccination 10/30/2019, 11/18/2019, 07/04/2020  . PPD Test 07/21/1982  . Pneumococcal Conjugate-13 06/20/2019, 06/26/2020  . Td 12/08/2019  . Tdap 06/30/2009  . Zoster 06/04/2012  . Zoster Recombinat (Shingrix) 01/08/2020   Health Maintenance Due  Topic Date Due  . MAMMOGRAM  05/17/2014    Last Pap smear: aged out  Last mammogram: June 2020 Last colonoscopy: 07/23/2008 and cologuard 10/16/18 Last DEXA: 05/17/16 Dentist:Q four months  Ophtho: Q year Exercise: runner, yoga, weights seven days a week  Other doctors caring for patient include: Dr. Cruzita Lederer endo, Dr. Fuller Plan, Dr. Robie Ridge  Advanced directives: Does Patient Have a Medical Advance Directive?: Yes Type of Advance  Directive: Living will,Healthcare Power of Attorney Does patient want to make changes to medical advance directive?: No - Patient declined Copy of [place] in Chart?: No - copy requested  Depression screen:  See questionnaire below.  Depression screen [facility] 2/9 11/26/2020 03/30/2020 08/13/2019 05/31/2018  Decreased Interest 0 0 0 0  Down, Depressed, Hopeless 0 0 0 0  PHQ - 2 Score 0 0 0 0    Fall Risk Screen: see questionnaire below. Fall Risk  11/26/2020 08/13/2019 05/31/2018  Falls in the past year? 0 0 No  Number falls in past yr: 0 - -  Injury with Fall? 0 - -  Risk for fall due to : No Fall Risks - -  Follow up Falls evaluation completed - -    ADL screen:  See questionnaire below Functional Status Survey: Is the patient deaf or have difficulty hearing?: No Does the patient have difficulty seeing, even when wearing glasses/contacts?: No Does the patient have difficulty concentrating, remembering, or making decisions?: No Does the patient have difficulty walking or climbing stairs?: No Does the patient have difficulty dressing or bathing?: No Does the patient have difficulty doing errands alone such as visiting a doctor's office or shopping?: No   Review of Systems Constitutional: -, -unexpected weight change, -anorexia, -fatigue Allergy: -sneezing, -itching, -congestion Dermatology: denies changing moles, rash, lumps ENT: -runny nose, -ear pain, -sore throat,  Cardiology:  -chest pain, -palpitations, -orthopnea, Respiratory: -cough, -shortness of breath, -dyspnea on exertion, -wheezing,  Gastroenterology: -abdominal pain, -nausea, -vomiting, -diarrhea, -constipation, -dysphagia Hematology: -bleeding or bruising problems Musculoskeletal: -arthralgias, -myalgias, -joint swelling, -back pain, - Ophthalmology: -vision changes,  Urology: -dysuria, -  difficulty urinating,  -urinary frequency, -urgency, incontinence Neurology: -, -numbness, , -memory loss, -falls,  -dizziness    PHYSICAL EXAM:   General Appearance: Alert, cooperative, no distress, appears stated age Head: Normocephalic, without obvious abnormality, atraumatic Eyes: PERRL, conjunctiva/corneas clear, EOM's intact, Ears: Normal TM's and external ear canals Nose: Nares normal, mucosa normal, no drainage or sinus tenderness Throat: Lips, mucosa, and tongue normal; teeth and gums normal Neck: Supple, no lymphadenopathy;  thyroid:  no enlargement/tenderness/nodules; no carotid bruit or JVD Lungs: Clear to auscultation bilaterally without wheezes, rales or ronchi; respirations unlabored Heart: Regular rate and rhythm, S1 and S2 normal, no murmur, rubor gallop Abdomen: Soft, non-tender, nondistended, normoactive bowel sounds,  no masses, no hepatosplenomegaly Extremities: No clubbing, cyanosis or edema Pulses: 2+ and symmetric all extremities Skin:  Skin color, texture, turgor normal, no rashes or lesions Lymph nodes: Cervical, supraclavicular, and axillary nodes normal Neurologic:  CNII-XII intact, normal strength, sensation and gait; reflexes 2+ and symmetric throughout Psych: Normal mood, affect, hygiene and grooming.  ASSESSMENT/PLAN: Osteoporosis without current pathological fracture, unspecified osteoporosis type - Plan: CBC with Differential/Platelet, Comprehensive metabolic panel, DG Bone Density  Lipid screening - Plan: Lipid panel  Mild intermittent asthma, unspecified whether complicated - Plan: CBC with Differential/Platelet, Comprehensive metabolic panel, albuterol (VENTOLIN HFA) 108 (90 Base) MCG/ACT inhaler  Multiple thyroid nodules  H/O parathyroidectomy ([place])  Hypothyroidism, unspecified type - Plan: TSH  Left rotator cuff tear arthropathy  Acute bursitis of right shoulder  Status post cataract extraction, unspecified laterality  Panic attacks We will need to get information concerning her immunizations.  She will continue to be followed by endocrinology.   Set up to come back in for her pneumonia shot.  Continue on present medications.  No intervention needed for the panic attacks.  Continue to be followed by Dr. Tamala Julian concerning her shoulder pain. Discussed  healthy diet, including goals of calcium and vitamin D intake and alcohol recommendations (less than or equal to 1 drink/day) reviewed;   Immunization recommendations discussed.  Colonoscopy recommendations reviewed Ventolin will be renewed.  Medicare Attestation I have personally reviewed: The patient's medical and social history Their use of alcohol, tobacco or illicit drugs Their current medications and supplements The patient's functional ability including ADLs,fall risks, home safety risks, cognitive, and hearing and visual impairment Diet and physical activities Evidence for depression or mood disorders  The patient's weight, height, and BMI have been recorded in the chart.  I have made referrals, counseling, and provided education to the patient based on review of the above and I have provided the patient with a written personalized care plan for preventive services.     Jill Alexanders, MD   11/26/2020

## 2020-11-27 LAB — LIPID PANEL
Chol/HDL Ratio: 2.7 ratio (ref 0.0–4.4)
Cholesterol, Total: 220 mg/dL — ABNORMAL HIGH (ref 100–199)
HDL: 83 mg/dL (ref >39)
LDL Chol Calc (NIH): 123 mg/dL — ABNORMAL HIGH (ref 0–99)
Triglycerides: 79 mg/dL (ref 0–149)
VLDL Cholesterol Cal: 14 mg/dL (ref 5–40)

## 2020-11-27 LAB — CBC WITH DIFFERENTIAL/PLATELET
Basophils Absolute: 0.1 10*3/uL (ref 0.0–0.2)
Basos: 2 %
EOS (ABSOLUTE): 0.1 10*3/uL (ref 0.0–0.4)
Eos: 2 %
Hematocrit: 42.5 % (ref 34.0–46.6)
Hemoglobin: 14.4 g/dL (ref 11.1–15.9)
Immature Grans (Abs): 0 10*3/uL (ref 0.0–0.1)
Immature Granulocytes: 0 %
Lymphocytes Absolute: 1.9 10*3/uL (ref 0.7–3.1)
Lymphs: 36 %
MCH: 32.9 pg (ref 26.6–33.0)
MCHC: 33.9 g/dL (ref 31.5–35.7)
MCV: 97 fL (ref 79–97)
Monocytes Absolute: 0.5 10*3/uL (ref 0.1–0.9)
Monocytes: 10 %
Neutrophils Absolute: 2.6 10*3/uL (ref 1.4–7.0)
Neutrophils: 50 %
Platelets: 302 10*3/uL (ref 150–450)
RBC: 4.38 x10E6/uL (ref 3.77–5.28)
RDW: 11.9 % (ref 11.7–15.4)
WBC: 5.2 10*3/uL (ref 3.4–10.8)

## 2020-11-27 LAB — COMPREHENSIVE METABOLIC PANEL
ALT: 15 IU/L (ref 0–32)
AST: 26 IU/L (ref 0–40)
Albumin/Globulin Ratio: 2 (ref 1.2–2.2)
Albumin: 4.7 g/dL (ref 3.8–4.8)
Alkaline Phosphatase: 64 IU/L (ref 44–121)
BUN/Creatinine Ratio: 16 (ref 12–28)
BUN: 10 mg/dL (ref 8–27)
Bilirubin Total: 0.5 mg/dL (ref 0.0–1.2)
CO2: 23 mmol/L (ref 20–29)
Calcium: 9.7 mg/dL (ref 8.7–10.3)
Chloride: 96 mmol/L (ref 96–106)
Creatinine, Ser: 0.63 mg/dL (ref 0.57–1.00)
GFR calc Af Amer: 106 mL/min/{1.73_m2} (ref >59)
GFR calc non Af Amer: 92 mL/min/{1.73_m2} (ref >59)
Globulin, Total: 2.3 g/dL (ref 1.5–4.5)
Glucose: 101 mg/dL — ABNORMAL HIGH (ref 65–99)
Potassium: 5.4 mmol/L — ABNORMAL HIGH (ref 3.5–5.2)
Sodium: 137 mmol/L (ref 134–144)
Total Protein: 7 g/dL (ref 6.0–8.5)

## 2020-11-27 LAB — TSH: TSH: 2.76 u[IU]/mL (ref 0.450–4.500)

## 2020-12-08 ENCOUNTER — Telehealth: Payer: Self-pay | Admitting: Family Medicine

## 2020-12-08 NOTE — Telephone Encounter (Signed)
Received requested records from [facility]  

## 2020-12-09 ENCOUNTER — Other Ambulatory Visit: Payer: Self-pay

## 2020-12-09 ENCOUNTER — Ambulatory Visit
Admission: RE | Admit: 2020-12-09 | Discharge: 2020-12-09 | Disposition: A | Discharge status: Home / Self Care | Payer: Medicare Other | Source: Ambulatory Visit | Attending: Family Medicine | Admitting: Family Medicine

## 2020-12-09 DIAGNOSIS — M810 Age-related osteoporosis without current pathological fracture: Secondary | ICD-10-CM | POA: Diagnosis not present

## 2020-12-09 DIAGNOSIS — Z780 Asymptomatic menopausal state: Secondary | ICD-10-CM | POA: Diagnosis not present

## 2020-12-10 ENCOUNTER — Encounter: Payer: Self-pay | Admitting: Family Medicine

## 2020-12-11 ENCOUNTER — Encounter: Payer: Self-pay | Admitting: Family Medicine

## 2020-12-11 NOTE — Progress Notes (Signed)
I discussed the DEXA scan with her and recommended going with Prolia.  She read about this and decided to continue on her present medication regimen and we can readdress this in 2 years.

## 2020-12-22 NOTE — Progress Notes (Signed)
[place] Phone: [phone] Subjective:   Shannon [name], am serving as a scribe for Dr. Hulan Saas. This visit occurred during the SARS-CoV-2 public health emergency.  Safety protocols were in place, including screening questions prior to the visit, additional usage of staff PPE, and extensive cleaning of exam room while observing appropriate contact time as indicated for disinfecting solutions.   I'm seeing this patient by the request  of:  [name], MD  CC: Shoulder pain follow-up  XAJ:[id number]   11/24/2020 Patient given injection.  Tolerated the procedure well of the PRP.  We warned the patient about potential side effects.  Patient decided on the contralateral side.  Patient wanted to avoid any type of the steroids at this time.  Patient does have a history of the parathyroidectomy that does cause some of the imbalances of the calcium.  I do think that that is contributing as well.  We will follow up again in 4 weeks to see how patient is responding.  Update 12/23/2020 Shannon [name] is a 70 y.o. female coming in with complaint of right shoulder PRP f/u. [name] pain in R shoulder.   Patient states that her L shoulder pain is persisting. Pain over top of shoulder.  Patient states that it is starting to give her more trouble.  This was the first 1 that we have given her more discomfort.  Patient states that it is only with certain movements.  Seems to be more sharp from time to time.  Nothing as severe as what it has been previously.       Past Medical History:  Diagnosis Date  . Allergy    RHINTIS  . Asthma   . Fibrocystic disease of breast   . Hemorrhoids   . Hx of colonic polyps   . Menopause   . Mitral valve prolapse   . Mitral valve prolapse   . PONV (postoperative nausea and vomiting)    Pt asked to be careful with her teeth, due to them being brittle from low calcium  . Primary  hyperparathyroidism ([place])    right  . Spinal headache    Past Surgical History:  Procedure Laterality Date  . APPENDECTOMY  1964  . COLONOSCOPY    . CYSTOSTOMY W/ BLADDER BIOPSY     Dr. Rosana Hoes  . HEMORRHOID SURGERY     Dr. Truitt Leep  . NECK EXPLORATION  04/17/2017   with parathyroidectomy  . PARATHYROIDECTOMY Right 01/02/2017   Procedure: RIGHT INFERIOR PARATHYROIDECTOMY;  Surgeon: Armandina Gemma, MD;  Location: [facility];  Service: General;  Laterality: Right;  . PARATHYROIDECTOMY N/A 04/17/2017   Procedure: NECK EXPLORATION WITH PARATHYROIDECTOMY;  Surgeon: Armandina Gemma, MD;  Location: [facility];  Service: General;  Laterality: N/A;  . THUMB FUSION  1997   Dr Maxie Better  . TONSILLECTOMY AND ADENOIDECTOMY    . tummy tuck     Social History   Socioeconomic History  . Marital status: Married    Spouse name: Not on file  . Number of children: 2  . Years of education: Not on file  . Highest education level: Not on file  Occupational History  . Occupation: retired  Tobacco Use  . Smoking status: Former Smoker    Packs/day: 0.25    Years: 10.00    Pack years: 2.50    Types: Cigarettes    Quit date: 06/04/1985    Years since quitting: 35.5  . Smokeless  tobacco: Never Used  Vaping Use  . Vaping Use: Never used  Substance and Sexual Activity  . Alcohol use: Yes    Alcohol/week: 5.0 standard drinks    Types: 5 Glasses of wine per week  . Drug use: [name]  . Sexual activity: Yes  Other Topics Concern  . Not on file  Social History Narrative  . Not on file   Social Determinants of Health   Financial Resource Strain: Not on file  Food Insecurity: Not on file  Transportation Needs: Not on file  Physical Activity: Not on file  Stress: Not on file  Social Connections: Not on file   Allergies  Allergen Reactions  . Asa [Aspirin] Other (See Comments)    Minor bleeding   Family History  Problem Relation Age of Onset  . Cancer Father 61       Lung cancer  . Heart disease Father   .  Hyperlipidemia Father   . Heart disease Mother 96       Sudden death presumed MI  . Heart disease Brother 58       Presumed CHF  . Heart disease Brother     Current Outpatient Medications (Endocrine & Metabolic):  .  levothyroxine (SYNTHROID) 88 MCG tablet, TAKE 1 TABLET(88 MCG) BY MOUTH DAILY BEFORE BREAKFAST   Current Outpatient Medications (Respiratory):  .  albuterol (VENTOLIN HFA) 108 (90 Base) MCG/ACT inhaler, INHALE 2 PUFFS INTO THE LUNGS EVERY 6 HOURS AS NEEDED FOR WHEEZING OR SHORTNESS OF BREATH   Current Outpatient Medications (Hematological):  .  vitamin B-12 (CYANOCOBALAMIN) 1000 MCG tablet, Take 1,000 mcg by mouth daily.  Current Outpatient Medications (Other):  Marland Kitchen  Cholecalciferol 50 MCG (2000 UT) CAPS, Take 2,000 Units by mouth daily. .  clindamycin (CLEOCIN) 150 MG capsule, TAKE 2 CAPSULES BY MOUTH 1 HOUR PRIOR TO APPOINTMENT AND 1 CAPSULE BY MOUTH 6 HRS AFTER 1ST DOSE .  Coenzyme Q10 (CO Q-10) 100 MG CAPS, Take 100 mg by mouth daily. .  Magnesium 250 MG TABS, Take 250 mg by mouth daily.    Reviewed prior external information including notes and imaging from  primary care provider As well as notes that were available from care everywhere and other healthcare systems.  Past medical history, social, surgical and family history all reviewed in electronic medical record.  [name] pertanent information unless stated regarding to the chief complaint.   Review of Systems:  [name] headache, visual changes, nausea, vomiting, diarrhea, constipation, dizziness, abdominal pain, skin rash, fevers, chills, night sweats, weight loss, swollen lymph nodes, body aches, joint swelling, chest pain, shortness of breath, mood changes. POSITIVE muscle aches  Objective  Blood pressure 132/84, pulse 60, height 5' 4.5" (1.638 m), weight 128 lb (58.1 kg), SpO2 99 %.   General: [name] apparent distress alert and oriented x3 mood and affect normal, dressed appropriately.  HEENT: Pupils equal, extraocular  movements intact  Respiratory: Patient's speak in full sentences and does not appear short of breath  Cardiovascular: [name] lower extremity edema, non tender, [name] erythema  Gait normal with good balance and coordination.  MSK: Left shoulder exam shows the patient's rotator cuff is still improved.  Patient does have pain over the acromioclavicular joint.  Mild positive crossover and mild positive impingement. Right shoulder has great strength with full range of motion.  Procedure: Real-time Ultrasound Guided Injection of left acromioclavicular joint Device: GE Logiq Q7 Ultrasound guided injection is preferred based studies that show increased duration, increased effect, greater accuracy, decreased procedural  pain, increased response rate, and decreased cost with ultrasound guided versus blind injection.  Verbal informed consent obtained.  Time-out conducted.  Noted [name] overlying erythema, induration, or other signs of local infection.  Skin prepped in a sterile fashion.  Local anesthesia: Topical Ethyl chloride.  With sterile technique and under real time ultrasound guidance: With a 25-gauge half inch needle injected with 0.5 cc of 0.5% Marcaine and 0.5 cc of Kenalog 40 mg/mL. Completed without difficulty  Pain immediately resolved suggesting accurate placement of the medication.  Advised to call if fevers/chills, erythema, induration, drainage, or persistent bleeding.  Impression: Technically successful ultrasound guided injection.    Impression and Recommendations:     The above documentation has been reviewed and is accurate and complete [name], DO

## 2020-12-23 ENCOUNTER — Ambulatory Visit (INDEPENDENT_AMBULATORY_CARE_PROVIDER_SITE_OTHER): Payer: Medicare Other | Admitting: Family Medicine

## 2020-12-23 ENCOUNTER — Ambulatory Visit: Payer: Self-pay

## 2020-12-23 ENCOUNTER — Other Ambulatory Visit: Payer: Self-pay

## 2020-12-23 ENCOUNTER — Encounter: Payer: Self-pay | Admitting: Family Medicine

## 2020-12-23 VITALS — BP 132/84 | HR 60 | Ht 64.5 in | Wt 128.0 lb

## 2020-12-23 DIAGNOSIS — M75102 Unspecified rotator cuff tear or rupture of left shoulder, not specified as traumatic: Secondary | ICD-10-CM

## 2020-12-23 DIAGNOSIS — M25512 Pain in left shoulder: Secondary | ICD-10-CM

## 2020-12-23 DIAGNOSIS — M25511 Pain in right shoulder: Secondary | ICD-10-CM

## 2020-12-23 DIAGNOSIS — M7551 Bursitis of right shoulder: Secondary | ICD-10-CM | POA: Diagnosis not present

## 2020-12-23 DIAGNOSIS — M12812 Other specific arthropathies, not elsewhere classified, left shoulder: Secondary | ICD-10-CM | POA: Diagnosis not present

## 2020-12-23 NOTE — Patient Instructions (Signed)
AC jt injection Follow up in 4 weeks but contact us if not better, we can do PRP

## 2020-12-23 NOTE — Assessment & Plan Note (Addendum)
Patient on exam today does have still some tightness noted.  Nothing severe at this moment.  Does still have a very mild bursitis that could be playing a role as well.  We will continue to monitor and patient will follow up again in 4 weeks

## 2020-12-23 NOTE — Assessment & Plan Note (Signed)
Patient seems to be doing much better after the PRP on the right side.  Very happy with results.

## 2020-12-23 NOTE — Assessment & Plan Note (Signed)
Injection given today.  Chronic problem with mild exacerbation.  We will see how much this is playing a role.  If no significant improvement then will consider possibly repeating PRP on the left shoulder.  Patient seems to be doing well with the strength and range of motion all at this time

## 2020-12-24 ENCOUNTER — Encounter: Payer: Self-pay | Admitting: Internal Medicine

## 2020-12-24 ENCOUNTER — Ambulatory Visit (INDEPENDENT_AMBULATORY_CARE_PROVIDER_SITE_OTHER): Payer: Medicare Other | Admitting: Internal Medicine

## 2020-12-24 VITALS — BP 138/90 | HR 64 | Ht 64.5 in | Wt 125.8 lb

## 2020-12-24 DIAGNOSIS — E039 Hypothyroidism, unspecified: Secondary | ICD-10-CM

## 2020-12-24 DIAGNOSIS — M810 Age-related osteoporosis without current pathological fracture: Secondary | ICD-10-CM | POA: Diagnosis not present

## 2020-12-24 DIAGNOSIS — E042 Nontoxic multinodular goiter: Secondary | ICD-10-CM | POA: Diagnosis not present

## 2020-12-24 DIAGNOSIS — E210 Primary hyperparathyroidism: Secondary | ICD-10-CM

## 2020-12-24 NOTE — Progress Notes (Addendum)
Patient ID: Shannon [name], female   DOB: 13-Oct-1950, 70 y.o.   MRN: [id number]   This visit occurred during the SARS-CoV-2 public health emergency.  Safety protocols were in place, including screening questions prior to the visit, additional usage of staff PPE, and extensive cleaning of exam room while observing appropriate contact time as indicated for disinfecting solutions.   HPI  Shannon [name] is a 70 y.o.-year-old female, initially referred by her PCP, Dr. Redmond School, returning for f/u for H/o Primary hyperparathyroidism, OP, multi-nodular thyroid. Last visit 1 year ago.  Interim history: -She continues to have chronic right shoulder pain, for which she is seeing Dr. Tamala Julian.  She has been getting injections with platelet rich plasma, which help significantly. -She has very good energy, continues running, and has no other complaints today.  Reviewed and addended hx:  Pt has had hypercalcemia at least since 2012.  I have reviewed pertinent labs:  Lab Results  Component Value Date   PTH 33 06/05/2017   PTH Comment 06/05/2017   PTH 118 (H) 02/03/2017   PTH Comment 02/03/2017   PTH 127 (H) 10/07/2016   PTH 169 (H) 09/07/2016   CALCIUM 9.7 11/26/2020   CALCIUM 9.8 08/13/2019   CALCIUM 9.5 03/05/2019   CALCIUM 9.5 05/31/2018   CALCIUM 10.0 06/05/2017   CALCIUM 8.8 (L) 04/18/2017   CALCIUM 10.8 (H) 04/11/2017   CALCIUM 11.1 (H) 02/03/2017   CALCIUM 11.8 (H) 10/07/2016   CALCIUM 12.1 (H) 08/24/2016  08/13/2019: Calcium 9.8 02/01/2018: Calcium 9.4, PTH 41 -per checked by Dr. Harlow Asa  Further labs confirm primary hyperparathyroidism:  Calcium     8.6 - 10.4 mg/dL 11.8 (H)  GFR, Est Non African American     >=60 mL/min >89  Vitamin D 1, 25 (OH) Total     18 - 72 pg/mL 117 (H)  Vitamin D3 1, 25 (OH)     pg/mL 117  Vitamin D2 1, 25 (OH)     pg/mL <8  PTH     15 - 65 pg/mL 127 (H)  Magnesium     1.5 - 2.5 mg/dL 2.0  Phosphorus     2.3 - 4.6 mg/dL 2.3   Urine calcium was  high: Component     Latest Ref Rng & Units 10/11/2016  Calcium, Ur     Not estab mg/dL 17  Calcium, 24 hour urine     35 - 250 mg/24 h 476 (H)  Creatinine, Urine     20 - 320 mg/dL 44  Creatinine, 24H Ur     0.63 - 2.50 g/24 h 1.23   11/22/2016: Technetium sestamibi scan: 1. No evidence of ectopic parathyroid adenoma. 2. Asymmetric nodular activity in the left peritracheal region on the delayed images could reflect asymmetric thyroid activity or a left-sided parathyroid adenoma.  11/30/2016: Neck ultrasound: There are 2 thyroid nodules as described. - The left mid thyroid nodule measures 2.7 x 2.2 x 1.2 cm solid/almost completely solid (2), isoechoic (1), and meets fine-needle aspiration criteria.  - The other nodule measures 0.9 cm x 0.7 x 0.6 cm,  solid/almost completely solid (2), hyperechoic (1), taller-than-wide (3), but not meeting criteria for biopsy. - There are no obvious masses external to the thyroid gland to suggest parathyroid adenoma.  12/09/2016: 4D CT: 1. 13 mm mass posterior and inferior to the right thyroid lobe suspicious for parathyroid adenoma. 2. Additional subcentimeter soft tissue nodules more inferiorly in the right neck, indeterminate. These may reflect small lymph nodes, however an  additional parathyroid adenoma is not excluded (particularly the most inferior nodule just above the thoracic inlet). 3. 2.2 cm left thyroid nodule as described on ultrasound.  12/14/2016: Thyroid nodule FNA: benign  She had parathyroidectomy by Dr. Harlow Asa - 01/02/2017.  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  She initially felt well after the surgery, but then started to be fatigued again.  Calcium remains high after the surgery: 11.9 (01/19/2017), while PTH decreased to 76 at follow-up with Dr. Harlow Asa 2 weeks postop.  However, it then increased to 118.  She had a second intervention: Parathyroidectomy in 04/17/2017 >> a second parathyroid adenoma was resected: Parathyroid gland, Left -  PARATHYROID ADENOMA.  Her calcium was 8.8 first day postop, then 04/27/2017: Ca 9.2, PTH was 61.She feels great after the surgery.  She feels great after the surgery.  Continues to exercise-running.  Thyroid ultrasound obtained by Dr. Arnoldo Morale: Thyroid U/S (02/12/2018): Left mid nodule 2 measures 2.4 x 2.3 x 1.4 cm and previously measured 2.7 x 2.2 x 1.2 cm. Biopsy was performed 12/14/2016. Right lobe nodule measures 0.8 cm and previously measured 0.9 cm. It does not meet criteria for biopsy nor follow-up. IMPRESSION: Left nodule 2 is stable and previously underwent biopsy on 12/14/2016.  Hypothyroidism:  Pt is on levothyroxine 88 mcg daily, taken: - in am (1-2 am) - fasting - at least 2h from b'fast - no Ca, Fe, MVI, PPIs - not on Biotin On B12, D3, CoQ10, Mg.  Reviewed her TFTs: Lab Results  Component Value Date   TSH 2.760 11/26/2020   TSH 1.69 12/24/2019   TSH 2.270 08/13/2019   TSH 6.30 (H) 06/06/2019   TSH 9.88 (H) 04/11/2019   TSH 16.35 (H) 03/05/2019   TSH 6.57 (H) 03/02/2018  ? 01/2018: TSH 8.98 Lab Results  Component Value Date   FREET4 1.17 12/24/2019   FREET4 0.97 06/06/2019   FREET4 0.66 04/11/2019   FREET4 0.57 (L) 03/05/2019   FREET4 0.68 03/02/2018   Lab Results  Component Value Date   T3FREE 3.0 03/05/2019   T3FREE 3.2 03/02/2018   Osteoporosis: -No falls or fractures since last visit  Reviewed available DXA scan reports: Date L1-L4 T score FN T score  12/09/2020 ([facility]) -3.0 RFN n/a LFN -3.0    Date L1-L4 T score FN T score  05/27/2016 ([facility]) -3.6 RFN -2.9 LFN -3.2   Prev: Date L1-L4 T score FN T score  05/17/2012  ([facility]) -3.1  RFN -2.7 LFN -3.0    She refuses antiresorptive medicine.  She has a history of left thumb fracture in 2001 while playing tennis. She had several falls playing sports or missing a step >> no fxs.  No history of kidney stones.  No CKD. Last BUN/Cr: Lab Results   Component Value Date   BUN 10 11/26/2020   BUN 7 (L) 08/13/2019   CREATININE 0.63 11/26/2020   CREATININE 0.65 08/13/2019   No history of vitamin D deficiency: Lab Results  Component Value Date   VD25OH 45.55 12/24/2019   VD25OH 46.31 03/05/2019   VD25OH 41.58 03/02/2018   VD25OH 32.31 02/03/2017   VD25OH 35 08/24/2016   VD25OH 31 06/04/2012   She continues on 2000 units vitamin D daily.  She is not eating dairy.  Her son is allergic to dairy.  Pt does not have a FH of hypercalcemia, pituitary tumors, thyroid cancer.   She continues intermittent fasting and is a vegetarian.  ROS: Constitutional: no weight gain/no weight loss, no  fatigue, no subjective hyperthermia, no subjective hypothermia Eyes: no blurry vision, no xerophthalmia ENT: no sore throat, + see HPI Cardiovascular: no CP/no SOB/no palpitations/no leg swelling Respiratory: no cough/no SOB/no wheezing Gastrointestinal: no N/no V/no D/no C/no acid reflux Musculoskeletal: no muscle aches/+ joint aches Skin: no rashes, no hair loss Neurological: no tremors/no numbness/no tingling/no dizziness  I reviewed pt's medications, allergies, PMH, social hx, family hx, and changes were documented in the history of present illness. Otherwise, unchanged from my initial visit note.  Past Medical History:  Diagnosis Date  . Allergy    RHINTIS  . Asthma   . Fibrocystic disease of breast   . Hemorrhoids   . Hx of colonic polyps   . Menopause   . Mitral valve prolapse   . Mitral valve prolapse   . PONV (postoperative nausea and vomiting)    Pt asked to be careful with her teeth, due to them being brittle from low calcium  . Primary hyperparathyroidism ([place])    right  . Spinal headache    Past Surgical History:  Procedure Laterality Date  . APPENDECTOMY  1964  . COLONOSCOPY    . CYSTOSTOMY W/ BLADDER BIOPSY     Dr. Rosana Hoes  . HEMORRHOID SURGERY     Dr. Truitt Leep  . NECK EXPLORATION  04/17/2017   with  parathyroidectomy  . PARATHYROIDECTOMY Right 01/02/2017   Procedure: RIGHT INFERIOR PARATHYROIDECTOMY;  Surgeon: Armandina Gemma, MD;  Location: [facility];  Service: General;  Laterality: Right;  . PARATHYROIDECTOMY N/A 04/17/2017   Procedure: NECK EXPLORATION WITH PARATHYROIDECTOMY;  Surgeon: Armandina Gemma, MD;  Location: [address];  Service: General;  Laterality: N/A;  . THUMB FUSION  1997   Dr Maxie Better  . TONSILLECTOMY AND ADENOIDECTOMY    . tummy tuck     Social History   Social History  . Marital status: Married    Spouse name: N/A  . Number of children: 2   Occupational History  . retired    Social History Main Topics  . Smoking status: Former Smoker    Packs/day: 0.25    Years: 10.00    Types: Cigarettes    Quit date: 06/04/1985  . Smokeless tobacco: Never Used  . Alcohol use 3.0 oz/week    5 Glasses of wine per week  . Drug use: No   Current Outpatient Medications on File Prior to Visit  Medication Sig Dispense Refill  . albuterol (VENTOLIN HFA) 108 (90 Base) MCG/ACT inhaler INHALE 2 PUFFS INTO THE LUNGS EVERY 6 HOURS AS NEEDED FOR WHEEZING OR SHORTNESS OF BREATH 18 g 0  . Cholecalciferol 50 MCG (2000 UT) CAPS Take 2,000 Units by mouth daily.    . clindamycin (CLEOCIN) 150 MG capsule TAKE 2 CAPSULES BY MOUTH 1 HOUR PRIOR TO APPOINTMENT AND 1 CAPSULE BY MOUTH 6 HRS AFTER 1ST DOSE    . Coenzyme Q10 (CO Q-10) 100 MG CAPS Take 100 mg by mouth daily.    Marland Kitchen levothyroxine (SYNTHROID) 88 MCG tablet TAKE 1 TABLET(88 MCG) BY MOUTH DAILY BEFORE BREAKFAST 90 tablet 3  . Magnesium 250 MG TABS Take 250 mg by mouth daily.     . vitamin B-12 (CYANOCOBALAMIN) 1000 MCG tablet Take 1,000 mcg by mouth daily.     No current facility-administered medications on file prior to visit.   Allergies  Allergen Reactions  . Asa [Aspirin] Other (See Comments)    Minor bleeding   Family History  Problem Relation Age of Onset  . Cancer Father 71  Lung cancer  . Heart disease Father   . Hyperlipidemia  Father   . Heart disease Mother 80       Sudden death presumed MI  . Heart disease Brother 62       Presumed CHF  . Heart disease Brother    Significant FH of early death 2/2 AMIs in parents and brothers.  PE: BP 138/90 (BP Location: Right Arm, Patient Position: Sitting, Cuff Size: Normal)   Pulse 64   Ht 5' 4.5" (1.638 m)   Wt 125 lb 12.8 oz (57.1 kg)   SpO2 94%   BMI 21.26 kg/m  Wt Readings from Last 3 Encounters:  12/24/20 125 lb 12.8 oz (57.1 kg)  12/23/20 128 lb (58.1 kg)  11/26/20 123 lb 9.6 oz (56.1 kg)   Constitutional: Normal weight, in NAD Eyes: PERRLA, EOMI, no exophthalmos ENT: moist mucous membranes, no thyromegaly, but left thyroid nodule easily palpable and mobile no cervical lymphadenopathy Cardiovascular: RRR, No MRG Respiratory: CTA B Gastrointestinal: abdomen soft, NT, ND, BS+ Musculoskeletal: no deformities, strength intact in all 4 Skin: moist, warm, no rashes Neurological: no tremor with outstretched hands, DTR normal in all 4  Assessment: 1. Primary hyperparathyroidism  2. Thyroid nodules - new dx  3.  Subclinical hypothyroidism  4. OP  Plan: 1. Patient with history of primary hyperparathyroidism, with highest calcium level being 12.1 and highest PTH level being 169.  Several investigations confirmed primary hyperparathyroidism and she was referred to surgery.  She had a right parathyroid adenoma resected in 12/2017, however, unfortunately, calcium and PTH remain high after the surgery.  She had a second, left, parathyroid adenoma resected in 04/2017 by Dr. Harlow Asa.  After the surgery, her calcium and PTH levels decreased and she started to feel great: With more energy, less anxiety, and less generalized pain.  She was using scar away patches on her scar, but this has healed now. -She continues on 2000 units vitamin D daily-we will recheck this today -Latest calcium level was normal, at 9.7 on 11/26/2020. -I will see her back in 1 year  2. Thyroid  nodules -No neck compression symptoms -In the past, she felt that her left thyroid nodule was fluctuating in size, and I suspect this may be due to possibly inflammation or fluid accumulation.  Of note, the nodule measured 2.4 cm and the biopsy was benign -Per review of the most recent thyroid ultrasound from 2019, the dominant nodule is still large, but slightly smaller, without change in ultrasound characteristics -No intervention needed for now, we will continue to keep an eye on this  3.  Subclinical hypothyroidism - latest thyroid labs reviewed with pt >> normal: Lab Results  Component Value Date   TSH 2.760 11/26/2020   - she continues on LT4 88 mcg daily - pt feels good on this dose. - we discussed about taking the thyroid hormone every day, with water, >30 minutes before breakfast, separated by >4 hours from acid reflux medications, calcium, iron, multivitamins. Pt. is taking it correctly.  She does take it in the middle of the night, between 1 and 2 AM, but she eats dinner early, no later than 7 PM.  4. OP  -No fractures or falls since last visit -We reviewed the report of the 2017 DXA scan: T-scores were worse.  She had a new DXA scan (unfortunately, this was checked on a different machine, at [place]) on 12/09/2020: T-scores slightly better, but we discussed that due to the change in densitometer,  the scores are not directly comparable. -Reviewed the report of the latest DXA scan from 2017: T-scores were worse -She continues to refuse antiresorptive medicines and tries to manage these naturally.  She continues exercise.  Component     Latest Ref Rng & Units 12/24/2020  Vitamin D, 25-Hydroxy     30.0 - 100.0 ng/mL 40.9  Vitamin D is normal.  Thyroid ultrasound (01/05/2021): Parenchymal Echotexture: Moderately heterogeneous Isthmus: 0.2 cm Right lobe: 3.5 x 1.7 x 1.6 cm Left lobe: 3.5 x 2.0 x 1.8 cm ______________________________________________________  Nodule 1:  0.6 cm isoechoic solid nodule in the mid thyroid lobe is not significantly changed in size since prior exam and does not meet criteria for FNA or imaging follow-up. _________________________________________________________  Nodule 2: 2.3 x 2.0 x 1.3 cm solid isoechoic nodule located in the mid left thyroid lobe is not significantly changed in size compared to prior examination where it measured 2.4 x 2.3 x 1.4 cm. Prior FNA of this nodule was performed on 12/14/2016. Please correlate with results.  IMPRESSION: Previously biopsied left mid thyroid nodule measuring 2.3 x 2.0 x 1.3 cm is not significantly changed in size.  The above is in keeping with the ACR TI-RADS recommendations - J Am Coll Radiol 2017;14:587-595.  Philemon Kingdom, MD PhD [facility] Endocrinology

## 2020-12-24 NOTE — Patient Instructions (Signed)
Please stop at the lab.  Please continue vitamin D 2000  Units daily.  Please continue levothyroxine 88 mcg daily.  Take the thyroid hormone every day, with water, at least 30 minutes before breakfast, separated by at least 4 hours from: - acid reflux medications - calcium - iron - multivitamins  Please come back for a follow-up appointment in 1 year.

## 2020-12-25 LAB — VITAMIN D 25 HYDROXY (VIT D DEFICIENCY, FRACTURES): Vit D, 25-Hydroxy: 40.9 ng/mL (ref 30.0–100.0)

## 2021-01-04 ENCOUNTER — Encounter: Payer: Self-pay | Admitting: Family Medicine

## 2021-01-05 ENCOUNTER — Ambulatory Visit
Admission: RE | Admit: 2021-01-05 | Discharge: 2021-01-05 | Disposition: A | Discharge status: Home / Self Care | Payer: Medicare Other | Source: Ambulatory Visit | Attending: Internal Medicine | Admitting: Internal Medicine

## 2021-01-05 DIAGNOSIS — E041 Nontoxic single thyroid nodule: Secondary | ICD-10-CM | POA: Diagnosis not present

## 2021-01-05 DIAGNOSIS — E042 Nontoxic multinodular goiter: Secondary | ICD-10-CM

## 2021-01-06 ENCOUNTER — Encounter: Payer: Self-pay | Admitting: Family Medicine

## 2021-01-12 ENCOUNTER — Encounter: Payer: Self-pay | Admitting: Family Medicine

## 2021-01-15 ENCOUNTER — Telehealth (INDEPENDENT_AMBULATORY_CARE_PROVIDER_SITE_OTHER): Payer: Medicare Other | Admitting: Family Medicine

## 2021-01-15 ENCOUNTER — Encounter: Payer: Self-pay | Admitting: Family Medicine

## 2021-01-15 VITALS — BP 127/81 | HR 81 | Wt 123.0 lb

## 2021-01-15 DIAGNOSIS — R059 Cough, unspecified: Secondary | ICD-10-CM | POA: Diagnosis not present

## 2021-01-15 DIAGNOSIS — J0140 Acute pansinusitis, unspecified: Secondary | ICD-10-CM | POA: Diagnosis not present

## 2021-01-15 MED ORDER — BENZONATATE 200 MG PO CAPS: 200.0000 mg | ORAL_CAPSULE | Freq: Two times a day (BID) | ORAL | 0 refills | Status: DC | PRN

## 2021-01-15 MED ORDER — AMOXICILLIN-POT CLAVULANATE 875-125 MG PO TABS: 1.0000 | ORAL_TABLET | Freq: Two times a day (BID) | ORAL | 0 refills | Status: DC

## 2021-01-15 NOTE — Progress Notes (Signed)
   Subjective:  Documentation for virtual audio and video telecommunications through [place] encounter:  The patient was located at home. 2 patient identifiers used.  The provider was located in the office. The patient did consent to this visit and is aware of possible charges through their insurance for this visit.  The other persons participating in this telemedicine service were none. Time spent on call was 16 minutes and in review of previous records >20 minutes total.  This virtual service is not related to other E/M service within previous 7 days.   Patient ID: Shannon [name], female    DOB: 10-Apr-1951, 70 y.o.   MRN: [id number]  HPI Chief Complaint  Patient presents with  . Sinusitis    Sinus infection and ear infection a couple weeks ago, taking otc allergy now, drainage is thicker, cough, sinus pressure,   Complains of a 2-3 week history of sinus pressure, nasal congestion, purulent nasal drainage and coughing up thick mucus.  States her ears are hurting.  Denies fever, body aches, dizziness, chest pain, palpitations, shortness of breath, abdominal pain, nausea, vomiting or diarrhea.  States she has underlying allergies and a deviated septum.   States she had Covid in early February.  She had 2 - Covid test.    Review of Systems Pertinent positives and negatives in the history of present illness.     Objective:   Physical Exam BP 127/81   Pulse 81   Wt 123 lb (55.8 kg)   SpO2 97%   BMI 20.79 kg/m   Alert and oriented and in no acute distress.  Respirations unlabored.  She is speaking in complete sense without difficulty.      Assessment & Plan:  Acute non-recurrent pansinusitis - Plan: amoxicillin-clavulanate (AUGMENTIN) 875-125 MG tablet  Cough - Plan: benzonatate (TESSALON) 200 MG capsule  Augmentin and Tessalon prescribed.  Discussed symptomatic treatment.  She will follow-up if worsening or not back to baseline when she completes the antibiotic.

## 2021-01-21 ENCOUNTER — Ambulatory Visit: Payer: Medicare Other | Admitting: Family Medicine

## 2021-01-25 DIAGNOSIS — Z23 Encounter for immunization: Secondary | ICD-10-CM | POA: Diagnosis not present

## 2021-02-01 NOTE — Progress Notes (Signed)
[place] Phone: [phone] Subjective:   [name], am serving as a scribe for Dr. Hulan Saas. This visit occurred during the SARS-CoV-2 public health emergency.  Safety protocols were in place, including screening questions prior to the visit, additional usage of staff PPE, and extensive cleaning of exam room while observing appropriate contact time as indicated for disinfecting solutions.   I'm seeing this patient by the request  of:  [name], MD  CC: Left shoulder pain  [id number]   12/23/2020 Patient on exam today does have still some tightness noted.  Nothing severe at this moment.  Does still have a very mild bursitis that could be playing a role as well.  We will continue to monitor and patient will follow up again in 4 weeks  Injection given today.  Chronic problem with mild exacerbation.  We will see how much this is playing a role.  If no significant improvement then will consider possibly repeating PRP on the left shoulder.  Patient seems to be doing well with the strength and range of motion all at this time  Patient seems to be doing much better after the PRP on the right side.  Very happy with results.  Update 02/02/2021 AYANNAH FADDIS is a 70 y.o. female coming in with complaint of L shoulder pain. PRP injection on 10/22/2020. Patient's arm catches her at times but subsides quickly. Exercises help to relieve her pain.       Past Medical History:  Diagnosis Date  . Allergy    RHINTIS  . Asthma   . Fibrocystic disease of breast   . Hemorrhoids   . Hx of colonic polyps   . Menopause   . Mitral valve prolapse   . Mitral valve prolapse   . PONV (postoperative nausea and vomiting)    Pt asked to be careful with her teeth, due to them being brittle from low calcium  . Primary hyperparathyroidism ([place])    right  . Spinal headache    Past Surgical History:  Procedure Laterality  Date  . APPENDECTOMY  1964  . COLONOSCOPY    . CYSTOSTOMY W/ BLADDER BIOPSY     Dr. Rosana Hoes  . HEMORRHOID SURGERY     Dr. Truitt Leep  . NECK EXPLORATION  04/17/2017   with parathyroidectomy  . PARATHYROIDECTOMY Right 01/02/2017   Procedure: RIGHT INFERIOR PARATHYROIDECTOMY;  Surgeon: Armandina Gemma, MD;  Location: [place];  Service: General;  Laterality: Right;  . PARATHYROIDECTOMY N/A 04/17/2017   Procedure: NECK EXPLORATION WITH PARATHYROIDECTOMY;  Surgeon: Armandina Gemma, MD;  Location: [place];  Service: General;  Laterality: N/A;  . THUMB FUSION  1997   Dr Maxie Better  . TONSILLECTOMY AND ADENOIDECTOMY    . tummy tuck     Social History   Socioeconomic History  . Marital status: Married    Spouse name: Not on file  . Number of children: 2  . Years of education: Not on file  . Highest education level: Not on file  Occupational History  . Occupation: retired  Tobacco Use  . Smoking status: Former Smoker    Packs/day: 0.25    Years: 10.00    Pack years: 2.50    Types: Cigarettes    Quit date: 06/04/1985    Years since quitting: 35.6  . Smokeless tobacco: Never Used  Vaping Use  . Vaping Use: Never used  Substance and Sexual Activity  . Alcohol use:  Yes    Alcohol/week: 5.0 standard drinks    Types: 5 Glasses of wine per week  . Drug use: No  . Sexual activity: Yes  Other Topics Concern  . Not on file  Social History Narrative  . Not on file   Social Determinants of Health   Financial Resource Strain: Not on file  Food Insecurity: Not on file  Transportation Needs: Not on file  Physical Activity: Not on file  Stress: Not on file  Social Connections: Not on file   Allergies  Allergen Reactions  . Asa [Aspirin] Other (See Comments)    Minor bleeding   Family History  Problem Relation Age of Onset  . Cancer Father 20       Lung cancer  . Heart disease Father   . Hyperlipidemia Father   . Heart disease Mother 72       Sudden death presumed MI  . Heart disease Brother 87        Presumed CHF  . Heart disease Brother     Current Outpatient Medications (Endocrine & Metabolic):  .  levothyroxine (SYNTHROID) 88 MCG tablet, TAKE 1 TABLET(88 MCG) BY MOUTH DAILY BEFORE BREAKFAST   Current Outpatient Medications (Respiratory):  .  albuterol (VENTOLIN HFA) 108 (90 Base) MCG/ACT inhaler, INHALE 2 PUFFS INTO THE LUNGS EVERY 6 HOURS AS NEEDED FOR WHEEZING OR SHORTNESS OF BREATH .  benzonatate (TESSALON) 200 MG capsule, Take 1 capsule (200 mg total) by mouth 2 (two) times daily as needed for cough.   Current Outpatient Medications (Hematological):  .  vitamin B-12 (CYANOCOBALAMIN) 1000 MCG tablet, Take 1,000 mcg by mouth daily.  Current Outpatient Medications (Other):  .  amoxicillin-clavulanate (AUGMENTIN) 875-125 MG tablet, Take 1 tablet by mouth 2 (two) times daily. .  Cholecalciferol 50 MCG (2000 UT) CAPS, Take 2,000 Units by mouth daily. .  Coenzyme Q10 (CO Q-10) 100 MG CAPS, Take 100 mg by mouth daily. .  Magnesium 250 MG TABS, Take 250 mg by mouth daily.    Reviewed prior external information including notes and imaging from  primary care provider As well as notes that were available from care everywhere and other healthcare systems.  Past medical history, social, surgical and family history all reviewed in electronic medical record.  No pertanent information unless stated regarding to the chief complaint.   Review of Systems:  No headache, visual changes, nausea, vomiting, diarrhea, constipation, dizziness, abdominal pain, skin rash, fevers, chills, night sweats, weight loss, swollen lymph nodes, body aches, joint swelling, chest pain, shortness of breath, mood changes. POSITIVE muscle aches  Objective  Blood pressure 124/80, pulse 82, height 5' 4.5" (1.638 m), weight 126 lb (57.2 kg), SpO2 96 %.   General: No apparent distress alert and oriented x3 mood and affect normal, dressed appropriately.  HEENT: Pupils equal, extraocular movements intact   Respiratory: Patient's speak in full sentences and does not appear short of breath  Cardiovascular: No lower extremity edema, non tender, no erythema  Gait normal with good balance and coordination.  MSK: Patient's left shoulder actually still has good range of motion noted.  Mild positive impingement.  Rotator cuff strength is doing relatively well.  Procedure: Real-time Ultrasound Guided Injection of left glenohumeral joint Device: GE Logiq E  Ultrasound guided injection is preferred based studies that show increased duration, increased effect, greater accuracy, decreased procedural pain, increased response rate with ultrasound guided versus blind injection.  Verbal informed consent obtained.  Time-out conducted.  Noted no overlying erythema,  induration, or other signs of local infection.  Skin prepped in a sterile fashion.  Local anesthesia: Topical Ethyl chloride.  With sterile technique and under real time ultrasound guidance:  Joint visualized.  21g 2 inch needle inserted lateral approach. Pictures taken for needle placement. Patient did have injection of 2 cc of 0.5% Marcaine, then injected 5 cc of 3 centrifuge PRP . Completed without difficulty  Pain immediately resolved suggesting accurate placement of the medication.  Impression: Technically successful ultrasound guided injection.    Impression and Recommendations:     The above documentation has been reviewed and is accurate and complete [name], DO

## 2021-02-02 ENCOUNTER — Other Ambulatory Visit: Payer: Self-pay

## 2021-02-02 ENCOUNTER — Ambulatory Visit (INDEPENDENT_AMBULATORY_CARE_PROVIDER_SITE_OTHER): Payer: Self-pay | Admitting: Family Medicine

## 2021-02-02 ENCOUNTER — Encounter: Payer: Self-pay | Admitting: Family Medicine

## 2021-02-02 ENCOUNTER — Ambulatory Visit: Payer: Self-pay

## 2021-02-02 VITALS — BP 124/80 | HR 82 | Ht 64.5 in | Wt 126.0 lb

## 2021-02-02 DIAGNOSIS — M12812 Other specific arthropathies, not elsewhere classified, left shoulder: Secondary | ICD-10-CM

## 2021-02-02 DIAGNOSIS — M75102 Unspecified rotator cuff tear or rupture of left shoulder, not specified as traumatic: Secondary | ICD-10-CM

## 2021-02-02 NOTE — Patient Instructions (Signed)
No ice or IBU for 3 days Heat or Tylenol for pain See me again in       weeks

## 2021-02-02 NOTE — Assessment & Plan Note (Signed)
Repeat PRP done today.  Patient tolerated the procedure well.  I am optimistic that patient should do relatively well overall.  Discussed icing regimen.  Discussed home exercises.  Patient continues to look to have also some bursitis.  At follow-up in 4 to 6 weeks if continuing to have bursitis would consider the possibility of aspiration or injection at that time.  Follow-up with me again in 6 weeks

## 2021-02-10 ENCOUNTER — Encounter: Payer: Self-pay | Admitting: Family Medicine

## 2021-02-10 ENCOUNTER — Other Ambulatory Visit: Payer: Self-pay

## 2021-02-10 ENCOUNTER — Telehealth (INDEPENDENT_AMBULATORY_CARE_PROVIDER_SITE_OTHER): Payer: Medicare Other | Admitting: Family Medicine

## 2021-02-10 VITALS — BP 123/75 | HR 73 | Temp 97.3°F | Wt 123.0 lb

## 2021-02-10 DIAGNOSIS — H9202 Otalgia, left ear: Secondary | ICD-10-CM

## 2021-02-10 DIAGNOSIS — J0140 Acute pansinusitis, unspecified: Secondary | ICD-10-CM | POA: Diagnosis not present

## 2021-02-10 DIAGNOSIS — J029 Acute pharyngitis, unspecified: Secondary | ICD-10-CM

## 2021-02-10 MED ORDER — AMOXICILLIN-POT CLAVULANATE 875-125 MG PO TABS: 1.0000 | ORAL_TABLET | Freq: Two times a day (BID) | ORAL | 0 refills | Status: DC

## 2021-02-10 NOTE — Progress Notes (Signed)
   Subjective:  Documentation for virtual telephone encounter. The patient was located at home. The provider was located in the office. The patient did consent to this visit and is aware of possible charges through their insurance for this visit.  The other persons participating in this telemedicine service were none. Time spent on call was 14 minutes and in review of previous records 16 minutes total.  This virtual service is not related to other E/M service within previous 7 days.  99441 (5-11min) 99442 (11-70min) 99443 (21-3min)   Patient ID: Shannon [name], female    DOB: 1951-04-15, 70 y.o.   MRN: [id number]  HPI Chief Complaint  Patient presents with  . Sore Throat    Sore throat only left side is red and no white through. Has alittle cough. Lymph node and ear pain swelling. Started x 3 days. Was recently on antibiotic   Complains of a 4 day history of sore throat, left ear pain, and soreness over the lymph nodes on the left cervical region. States her left ear feels clogged. Starting to get a cough.  States her throat on the left side is very painful and it hurts to swallow. Hx of asthma.   Negative Covid test this morning.   Antibiotics for last month acute sinusitis. States she was back to her baseline when she finished the Abx.   Denies fever, chills, dizziness, chest pain, palpitations, shortness of breath, abdominal pain, N/V/D,  LE edema.     Review of Systems Pertinent positives and negatives in the history of present illness.      Objective:   Physical Exam BP 123/75   Pulse 73   Temp (!) 97.3 F (36.3 C)   Wt 123 lb (55.8 kg)   SpO2 97%   BMI 20.79 kg/m   Alert and oriented in no acute distress.  She is speaking in complete sentences without difficulty.      Assessment & Plan:  Acute pharyngitis, unspecified etiology - Plan: amoxicillin-clavulanate (AUGMENTIN) 875-125 MG tablet  Left ear pain  Acute non-recurrent pansinusitis  I will treat  her with Augmentin.  She is aware of symptomatic management.  Negative COVID test at home.  She will come in if she is worsening or not back to baseline when she finishes the antibiotic.

## 2021-03-08 ENCOUNTER — Encounter: Payer: Self-pay | Admitting: Family Medicine

## 2021-03-09 NOTE — Progress Notes (Signed)
Shannon [name]: [phone] Subjective:   [name], am serving as a scribe for Dr. Hulan Saas. This visit occurred during the SARS-CoV-2 public health emergency.  Safety protocols were in place, including screening questions prior to the visit, additional usage of staff PPE, and extensive cleaning of exam room while observing appropriate contact time as indicated for disinfecting solutions.   I'm seeing this patient by the request  of:  [name], MD  CC: Low back and buttocks pain   [id number]   02/02/2021 Repeat PRP done today.  Patient tolerated the procedure well.  I am optimistic that patient should do relatively well overall.  Discussed icing regimen.  Discussed home exercises.  Patient continues to look to have also some bursitis.  At follow-up in 4 to 6 weeks if continuing to have bursitis would consider the possibility of aspiration or injection at that time.  Follow-up with me again in 6 weeks  Update 03/10/2021 Shannon [name] is a 70 y.o. female coming in with complaint of L SI joint. Patient states that she was running on an uneven surface. Standing increases her pain to 10/10. Unable to go to grocery store due to pain. Pain with sitting and lying down is 4/10. Has been icing for pain relief. Pain has improved over the past 2 weeks. Pain will radiate down into L calf.      Past Medical History:  Diagnosis Date  . Allergy    RHINTIS  . Asthma   . Fibrocystic disease of breast   . Hemorrhoids   . Hx of colonic polyps   . Menopause   . Mitral valve prolapse   . Mitral valve prolapse   . PONV (postoperative nausea and vomiting)    Pt asked to be careful with her teeth, due to them being brittle from low calcium  . Primary hyperparathyroidism ([place])    right  . Spinal headache    Past Surgical History:  Procedure Laterality Date  . APPENDECTOMY  1964  . COLONOSCOPY    . CYSTOSTOMY  W/ BLADDER BIOPSY     Dr. Rosana Hoes  . HEMORRHOID SURGERY     Dr. Truitt Leep  . NECK EXPLORATION  04/17/2017   with parathyroidectomy  . PARATHYROIDECTOMY Right 01/02/2017   Procedure: RIGHT INFERIOR PARATHYROIDECTOMY;  Surgeon: Armandina Gemma, MD;  Location: [facility];  Service: General;  Laterality: Right;  . PARATHYROIDECTOMY N/A 04/17/2017   Procedure: NECK EXPLORATION WITH PARATHYROIDECTOMY;  Surgeon: Armandina Gemma, MD;  Location: [place];  Service: General;  Laterality: N/A;  . THUMB FUSION  1997   Dr Maxie Better  . TONSILLECTOMY AND ADENOIDECTOMY    . tummy tuck     Social History   Socioeconomic History  . Marital status: Married    Spouse name: Not on file  . Number of children: 2  . Years of education: Not on file  . Highest education level: Not on file  Occupational History  . Occupation: retired  Tobacco Use  . Smoking status: Former Smoker    Packs/day: 0.25    Years: 10.00    Pack years: 2.50    Types: Cigarettes    Quit date: 06/04/1985    Years since quitting: 35.7  . Smokeless tobacco: Never Used  Vaping Use  . Vaping Use: Never used  Substance and Sexual Activity  . Alcohol use: Yes    Alcohol/week: 5.0 standard drinks    Types: 5  Glasses of wine per week  . Drug use: No  . Sexual activity: Yes  Other Topics Concern  . Not on file  Social History Narrative  . Not on file   Social Determinants of Health   Financial Resource Strain: Not on file  Food Insecurity: Not on file  Transportation Needs: Not on file  Physical Activity: Not on file  Stress: Not on file  Social Connections: Not on file   Allergies  Allergen Reactions  . Asa [Aspirin] Other (See Comments)    Minor bleeding   Family History  Problem Relation Age of Onset  . Cancer Father 43       Lung cancer  . Heart disease Father   . Hyperlipidemia Father   . Heart disease Mother 33       Sudden death presumed MI  . Heart disease Brother 20       Presumed CHF  . Heart disease Brother     Current  Outpatient Medications (Endocrine & Metabolic):  .  levothyroxine (SYNTHROID) 88 MCG tablet, TAKE 1 TABLET(88 MCG) BY MOUTH DAILY BEFORE BREAKFAST   Current Outpatient Medications (Respiratory):  .  albuterol (VENTOLIN HFA) 108 (90 Base) MCG/ACT inhaler, INHALE 2 PUFFS INTO THE LUNGS EVERY 6 HOURS AS NEEDED FOR WHEEZING OR SHORTNESS OF BREATH   Current Outpatient Medications (Hematological):  .  vitamin B-12 (CYANOCOBALAMIN) 1000 MCG tablet, Take 1,000 mcg by mouth daily.  Current Outpatient Medications (Other):  .  amoxicillin-clavulanate (AUGMENTIN) 875-125 MG tablet, Take 1 tablet by mouth 2 (two) times daily. .  Cholecalciferol 50 MCG (2000 UT) CAPS, Take 2,000 Units by mouth daily. .  Coenzyme Q10 (CO Q-10) 100 MG CAPS, Take 100 mg by mouth daily. .  Magnesium 250 MG TABS, Take 250 mg by mouth daily.    Reviewed prior external information including notes and imaging from  primary care provider As well as notes that were available from care everywhere and other healthcare systems.  Past medical history, social, surgical and family history all reviewed in electronic medical record.  No pertanent information unless stated regarding to the chief complaint.   Review of Systems:  No headache, visual changes, nausea, vomiting, diarrhea, constipation, dizziness, abdominal pain, skin rash, fevers, chills, night sweats, weight loss, swollen lymph nodes, body aches, joint swelling, chest pain, shortness of breath, mood changes. POSITIVE muscle aches  Objective  Blood pressure 128/90, pulse 89, height 5' 4.5" (1.638 m), weight 127 lb (57.6 kg), SpO2 99 %.   General: No apparent distress alert and oriented x3 mood and affect normal, dressed appropriately.  HEENT: Pupils equal, extraocular movements intact  Respiratory: Patient's speak in full sentences and does not appear short of breath  Cardiovascular: No lower extremity edema, non tender, no erythema  Gait normal with good balance and  coordination.  MSK: Shoulder exam shows the patient still has some very mild positive impingement.  Rotator cuff strength is improved with Marterre 5 strength on the right side and 4+ strength on the left side. Low back exam does have some mild loss of lordosis.  Tightness noted with FABER test left greater than right.  Mild pain noted with flexion and extension of the back.  Mild pain over the sacroiliac joint noted as well on the left greater than the right.  Limited musculoskeletal ultrasound was performed and interpreted by [name]  Limited ultrasound of the shoulders bilaterally show the patient does have significant improvement in the health of the rotator cuff  on the right side.  Left side still has a partial tear is noted.  Patient does have surrounding calcific bursitis total of the right and left shoulder. Impression: Interval improvement of rotator cuff but continued calcific changes of the shoulders bilaterally.   Impression and Recommendations:     The above documentation has been reviewed and is accurate and complete [name], DO

## 2021-03-10 ENCOUNTER — Other Ambulatory Visit: Payer: Self-pay

## 2021-03-10 ENCOUNTER — Encounter: Payer: Self-pay | Admitting: Family Medicine

## 2021-03-10 ENCOUNTER — Ambulatory Visit (INDEPENDENT_AMBULATORY_CARE_PROVIDER_SITE_OTHER): Payer: Medicare Other | Admitting: Family Medicine

## 2021-03-10 ENCOUNTER — Ambulatory Visit: Payer: Self-pay

## 2021-03-10 VITALS — BP 128/90 | HR 89 | Ht 64.5 in | Wt 127.0 lb

## 2021-03-10 DIAGNOSIS — M7551 Bursitis of right shoulder: Secondary | ICD-10-CM | POA: Diagnosis not present

## 2021-03-10 DIAGNOSIS — M25551 Pain in right hip: Secondary | ICD-10-CM

## 2021-03-10 DIAGNOSIS — M2550 Pain in unspecified joint: Secondary | ICD-10-CM | POA: Diagnosis not present

## 2021-03-10 DIAGNOSIS — G5702 Lesion of sciatic nerve, left lower limb: Secondary | ICD-10-CM | POA: Diagnosis not present

## 2021-03-10 DIAGNOSIS — M12812 Other specific arthropathies, not elsewhere classified, left shoulder: Secondary | ICD-10-CM | POA: Diagnosis not present

## 2021-03-10 DIAGNOSIS — M75102 Unspecified rotator cuff tear or rupture of left shoulder, not specified as traumatic: Secondary | ICD-10-CM

## 2021-03-10 DIAGNOSIS — M25552 Pain in left hip: Secondary | ICD-10-CM

## 2021-03-10 DIAGNOSIS — G8929 Other chronic pain: Secondary | ICD-10-CM

## 2021-03-10 DIAGNOSIS — M545 Low back pain, unspecified: Secondary | ICD-10-CM | POA: Diagnosis not present

## 2021-03-10 MED ORDER — METHYLPREDNISOLONE ACETATE 40 MG/ML IJ SUSP
40.0000 mg | Freq: Once | INTRAMUSCULAR | Status: AC
  Administered 2021-03-10: 40 mg via INTRAMUSCULAR

## 2021-03-10 MED ORDER — KETOROLAC TROMETHAMINE 30 MG/ML IJ SOLN
30.0000 mg | Freq: Once | INTRAMUSCULAR | Status: AC
  Administered 2021-03-10: 30 mg via INTRAMUSCULAR

## 2021-03-10 NOTE — Patient Instructions (Addendum)
Injections in backside today Xray on way out PRP next week

## 2021-03-10 NOTE — Assessment & Plan Note (Signed)
Regarding the shoulder patient has made good progress.  Still has some mild weakness compared to the contralateral side.  Calcific bursitis noted still.  Discussed potential need for advanced imaging.  Patient feels that with her making improvement does not want to do anything else more rash at the moment.  Patient will continue with the home exercises.  No fevers chills or any abnormal weight loss.  Will discuss with patient again at follow-up.

## 2021-03-10 NOTE — Assessment & Plan Note (Signed)
Patient has had difficulty with this previously.  Patient feels like it is similar even though her symptoms do not seem to go with the piriformis.  Seems to be worse with standing other than sitting.  Patient ultimately decided to try the Toradol and Depo-Medrol today.  Warned of potential side effects.  Patient has responded well to PRP in this area as well as with her shoulders and is wanting to consider this again.  Discussed with her that we may consider this more of the back and we will further evaluate with x-rays.  May need some other medications such as gabapentin but we can discuss at follow-up.  Patient will call if she does want to follow-up with the PRP injection

## 2021-03-16 NOTE — Progress Notes (Signed)
[facility] Phone: [phone] Subjective:   Shannon [name], am serving as a scribe for Dr. Hulan Saas. This visit occurred during the SARS-CoV-2 public health emergency.  Safety protocols were in place, including screening questions prior to the visit, additional usage of staff PPE, and extensive cleaning of exam room while observing appropriate contact time as indicated for disinfecting solutions.   I'm seeing this patient by the request  of:  [name], MD  CC: Left piriformis pain  [id number]   03/10/2021 Regarding the shoulder patient has made good progress.  Still has some mild weakness compared to the contralateral side.  Calcific bursitis noted still.  Discussed potential need for advanced imaging.  Patient feels that with her making improvement does not want to do anything else more rash at the moment.  Patient will continue with the home exercises.  [name] fevers chills or any abnormal weight loss.  Will discuss with patient again at follow-up.  Patient has had difficulty with this previously.  Patient feels like it is similar even though her symptoms do not seem to go with the piriformis.  Seems to be worse with standing other than sitting.  Patient ultimately decided to try the Toradol and Depo-Medrol today.  Warned of potential side effects.  Patient has responded well to PRP in this area as well as with her shoulders and is wanting to consider this again.  Discussed with her that we may consider this more of the back and we will further evaluate with x-rays.  May need some other medications such as gabapentin but we can discuss at follow-up.  Patient will call if she does want to follow-up with the PRP injection  Update 03/17/2021 Shannon [name] is a 70 y.o. female coming in with complaint of L piriformis pain. Patient here for PRP. Haad left piriformis injected years ago with great response.  Also responded  well to PRP for the shoulders.  Patient states that her pain is the same as last visit.       Past Medical History:  Diagnosis Date  . Allergy    RHINTIS  . Asthma   . Fibrocystic disease of breast   . Hemorrhoids   . Hx of colonic polyps   . Menopause   . Mitral valve prolapse   . Mitral valve prolapse   . PONV (postoperative nausea and vomiting)    Pt asked to be careful with her teeth, due to them being brittle from low calcium  . Primary hyperparathyroidism ([place])    right  . Spinal headache    Past Surgical History:  Procedure Laterality Date  . APPENDECTOMY  1964  . COLONOSCOPY    . CYSTOSTOMY W/ BLADDER BIOPSY     Dr. Rosana Hoes  . HEMORRHOID SURGERY     Dr. Truitt Leep  . NECK EXPLORATION  04/17/2017   with parathyroidectomy  . PARATHYROIDECTOMY Right 01/02/2017   Procedure: RIGHT INFERIOR PARATHYROIDECTOMY;  Surgeon: Armandina Gemma, MD;  Location: [place] [facility];  Service: General;  Laterality: Right;  . PARATHYROIDECTOMY N/A 04/17/2017   Procedure: NECK EXPLORATION WITH PARATHYROIDECTOMY;  Surgeon: Armandina Gemma, MD;  Location: [place];  Service: General;  Laterality: N/A;  . THUMB FUSION  1997   Dr Maxie Better  . TONSILLECTOMY AND ADENOIDECTOMY    . tummy tuck     Social History   Socioeconomic History  . Marital status: Married    Spouse name: Not on file  .  Number of children: 2  . Years of education: Not on file  . Highest education level: Not on file  Occupational History  . Occupation: retired  Tobacco Use  . Smoking status: Former Smoker    Packs/day: 0.25    Years: 10.00    Pack years: 2.50    Types: Cigarettes    Quit date: 06/04/1985    Years since quitting: 35.8  . Smokeless tobacco: Never Used  Vaping Use  . Vaping Use: Never used  Substance and Sexual Activity  . Alcohol use: Yes    Alcohol/week: 5.0 standard drinks    Types: 5 Glasses of wine per week  . Drug use: [name]  . Sexual activity: Yes  Other Topics Concern  . Not on file  Social History Narrative  .  Not on file   Social Determinants of Health   Financial Resource Strain: Not on file  Food Insecurity: Not on file  Transportation Needs: Not on file  Physical Activity: Not on file  Stress: Not on file  Social Connections: Not on file   Allergies  Allergen Reactions  . Asa [Aspirin] Other (See Comments)    Minor bleeding   Family History  Problem Relation Age of Onset  . Cancer Father 56       Lung cancer  . Heart disease Father   . Hyperlipidemia Father   . Heart disease Mother 27       Sudden death presumed MI  . Heart disease Brother 28       Presumed CHF  . Heart disease Brother     Current Outpatient Medications (Endocrine & Metabolic):  .  levothyroxine (SYNTHROID) 88 MCG tablet, TAKE 1 TABLET(88 MCG) BY MOUTH DAILY BEFORE BREAKFAST   Current Outpatient Medications (Respiratory):  .  albuterol (VENTOLIN HFA) 108 (90 Base) MCG/ACT inhaler, INHALE 2 PUFFS INTO THE LUNGS EVERY 6 HOURS AS NEEDED FOR WHEEZING OR SHORTNESS OF BREATH   Current Outpatient Medications (Hematological):  .  vitamin B-12 (CYANOCOBALAMIN) 1000 MCG tablet, Take 1,000 mcg by mouth daily.  Current Outpatient Medications (Other):  .  amoxicillin-clavulanate (AUGMENTIN) 875-125 MG tablet, Take 1 tablet by mouth 2 (two) times daily. .  Cholecalciferol 50 MCG (2000 UT) CAPS, Take 2,000 Units by mouth daily. .  Coenzyme Q10 (CO Q-10) 100 MG CAPS, Take 100 mg by mouth daily. .  Magnesium 250 MG TABS, Take 250 mg by mouth daily.      Objective  Blood pressure (!) 142/86, pulse 78, height 5\' 4"  (1.626 m), weight 127 lb (57.6 kg), SpO2 96 %.   General: [name] apparent distress alert and oriented x3 mood and affect normal, dressed appropriately.  HEENT: Pupils equal, extraocular movements intact  Respiratory: Patient's speak in full sentences and does not appear short of breath  Cardiovascular: [name] lower extremity edema, non tender, [name] erythema  Gait normal with good balance and coordination.  MSK:   Left glut exam shows severe tenderness over the left piriformis.  Worsening pain with FABER test.  Mild weakness with FABER test.  Patient has negative straight leg test.   Procedure: Real-time Ultrasound Guided Injection of left piriformis tendon sheath Device: GE Logiq Q7 Ultrasound guided injection is preferred based studies that show increased duration, increased effect, greater accuracy, decreased procedural pain, increased response rate, and decreased cost with ultrasound guided versus blind injection.  Verbal informed consent obtained.  Time-out conducted.  Noted [name] overlying erythema, induration, or other signs of local infection.  Skin prepped in  a sterile fashion.  Local anesthesia: Topical Ethyl chloride.  With sterile technique and under real time ultrasound guidance: With a 21-gauge 2 inch needle patient was injected into the piriformis tendon sheath with a total of 0.5 cc of 0.5% Marcaine.  Then injected 6 cc of 3 centrifuge PRP within the tendon sheath.  Diffuse laterally. Completed without difficulty  Pain immediately improved suggesting accurate placement of the medication.  Advised to call if fevers/chills, erythema, induration, drainage, or persistent bleeding.  Impression: Technically successful ultrasound guided injection.     Impression and Recommendations:     The above documentation has been reviewed and is accurate and complete [name], DO

## 2021-03-17 ENCOUNTER — Other Ambulatory Visit: Payer: Self-pay

## 2021-03-17 ENCOUNTER — Ambulatory Visit: Payer: Self-pay

## 2021-03-17 ENCOUNTER — Encounter: Payer: Self-pay | Admitting: Family Medicine

## 2021-03-17 ENCOUNTER — Ambulatory Visit (INDEPENDENT_AMBULATORY_CARE_PROVIDER_SITE_OTHER): Payer: Self-pay | Admitting: Family Medicine

## 2021-03-17 VITALS — BP 142/86 | HR 78 | Ht 64.0 in | Wt 127.0 lb

## 2021-03-17 DIAGNOSIS — M25552 Pain in left hip: Secondary | ICD-10-CM

## 2021-03-17 DIAGNOSIS — G5702 Lesion of sciatic nerve, left lower limb: Secondary | ICD-10-CM

## 2021-03-17 NOTE — Patient Instructions (Signed)
No ice or IBU for 3 days Heat is ok See me again in 6-8 weeks

## 2021-03-17 NOTE — Assessment & Plan Note (Signed)
Patient given injection and tolerated the procedure well.  Discussed icing regimen and home exercises.  Discussed avoiding certain activities.  Follow-up with me again in 6 to 8 weeks patient given a post PRP injection handout with exercises

## 2021-03-18 ENCOUNTER — Encounter: Payer: Self-pay | Admitting: Family Medicine

## 2021-04-14 ENCOUNTER — Encounter: Payer: Self-pay | Admitting: Family Medicine

## 2021-05-03 ENCOUNTER — Encounter: Payer: Self-pay | Admitting: Family Medicine

## 2021-05-04 ENCOUNTER — Other Ambulatory Visit: Payer: Self-pay | Admitting: Internal Medicine

## 2021-05-06 NOTE — Progress Notes (Signed)
[place] Phone: [phone] Subjective:    I'm seeing this patient by the request  of:  [name], MD  CC: Buttocks pain follow-up  [id number]  03/17/2021 Patient given injection and tolerated the procedure well.  Discussed icing regimen and home exercises.  Discussed avoiding certain activities.  Follow-up with me again in 6 to 8 weeks patient given a post PRP injection handout with exercises  Update 05/11/2021 SANDRA KOSMALSKI is a 70 y.o. female coming in with complaint of L hip pain. Patient states that her pain was worse over the past 2 months but recently her pain has started to improve. Would like to do PRP though to help with rest of her pain. Pain is in middle of L glute that radiates down into the hamstring. Patient's pain was 10/10 and was having difficulty sleeping.  Patient states that now seems to be making some mild improvement but he still feels that PRP would be more helpful.     Past Medical History:  Diagnosis Date   Allergy    RHINTIS   Asthma    Fibrocystic disease of breast    Hemorrhoids    Hx of colonic polyps    Menopause    Mitral valve prolapse    Mitral valve prolapse    PONV (postoperative nausea and vomiting)    Pt asked to be careful with her teeth, due to them being brittle from low calcium   Primary hyperparathyroidism ([place])    right   Spinal headache    Past Surgical History:  Procedure Laterality Date   APPENDECTOMY  1964   COLONOSCOPY     CYSTOSTOMY W/ BLADDER BIOPSY     Dr. Rosana Hoes   HEMORRHOID SURGERY     Dr. Truitt Leep   NECK EXPLORATION  04/17/2017   with parathyroidectomy   PARATHYROIDECTOMY Right 01/02/2017   Procedure: RIGHT INFERIOR PARATHYROIDECTOMY;  Surgeon: Armandina Gemma, MD;  Location: [facility];  Service: General;  Laterality: Right;   PARATHYROIDECTOMY N/A 04/17/2017   Procedure: NECK EXPLORATION WITH PARATHYROIDECTOMY;  Surgeon: Armandina Gemma, MD;   Location: [facility];  Service: General;  Laterality: N/A;   THUMB FUSION  1997   Dr Maxie Better   TONSILLECTOMY AND ADENOIDECTOMY     tummy tuck     Social History   Socioeconomic History   Marital status: Married    Spouse name: Not on file   Number of children: 2   Years of education: Not on file   Highest education level: Not on file  Occupational History   Occupation: retired  Tobacco Use   Smoking status: Former    Packs/day: 0.25    Years: 10.00    Pack years: 2.50    Types: Cigarettes    Quit date: 06/04/1985    Years since quitting: 35.9   Smokeless tobacco: Never  Vaping Use   Vaping Use: Never used  Substance and Sexual Activity   Alcohol use: Yes    Alcohol/week: 5.0 standard drinks    Types: 5 Glasses of wine per week   Drug use: No   Sexual activity: Yes  Other Topics Concern   Not on file  Social History Narrative   Not on file   Social Determinants of Health   Financial Resource Strain: Not on file  Food Insecurity: Not on file  Transportation Needs: Not on file  Physical Activity: Not on file  Stress: Not on file  Social  Connections: Not on file   Allergies  Allergen Reactions   Asa [Aspirin] Other (See Comments)    Minor bleeding   Family History  Problem Relation Age of Onset   Cancer Father 70       Lung cancer   Heart disease Father    Hyperlipidemia Father    Heart disease Mother 102       Sudden death presumed MI   Heart disease Brother 75       Presumed CHF   Heart disease Brother     Current Outpatient Medications (Endocrine & Metabolic):    levothyroxine (SYNTHROID) 88 MCG tablet, TAKE 1 TABLET(88 MCG) BY MOUTH DAILY BEFORE BREAKFAST   Current Outpatient Medications (Respiratory):    albuterol (VENTOLIN HFA) 108 (90 Base) MCG/ACT inhaler, INHALE 2 PUFFS INTO THE LUNGS EVERY 6 HOURS AS NEEDED FOR WHEEZING OR SHORTNESS OF BREATH   Current Outpatient Medications (Hematological):    vitamin B-12 (CYANOCOBALAMIN) 1000 MCG tablet, Take  1,000 mcg by mouth daily.  Current Outpatient Medications (Other):    amoxicillin-clavulanate (AUGMENTIN) 875-125 MG tablet, Take 1 tablet by mouth 2 (two) times daily.   Cholecalciferol 50 MCG (2000 UT) CAPS, Take 2,000 Units by mouth daily.   Coenzyme Q10 (CO Q-10) 100 MG CAPS, Take 100 mg by mouth daily.   Magnesium 250 MG TABS, Take 250 mg by mouth daily.    Reviewed prior external information including notes and imaging from  primary care provider As well as notes that were available from care everywhere and other healthcare systems.  Past medical history, social, surgical and family history all reviewed in electronic medical record.  No pertanent information unless stated regarding to the chief complaint.   Review of Systems:  No headache, visual changes, nausea, vomiting, diarrhea, constipation, dizziness, abdominal pain, skin rash, fevers, chills, night sweats, weight loss, swollen lymph nodes, body aches, joint swelling, chest pain, shortness of breath, mood changes. POSITIVE muscle aches  Objective  Blood pressure (!) 142/90, pulse 91, height '5\' 4"'$  (1.626 m), weight 128 lb (58.1 kg), SpO2 97 %.   General: No apparent distress alert and oriented x3 mood and affect normal, dressed appropriately.   Procedure: Real-time Ultrasound Guided Injection of left piriformis tendon sheath Device: GE Logiq Q7 Ultrasound guided injection is preferred based studies that show increased duration, increased effect, greater accuracy, decreased procedural pain, increased response rate, and decreased cost with ultrasound guided versus blind injection.  Verbal informed consent obtained.  Time-out conducted.  Noted no overlying erythema, induration, or other signs of local infection.  Skin prepped in a sterile fashion.  Local anesthesia: Topical Ethyl chloride.  With sterile technique and under real time ultrasound guidance: With a 21-gauge 2 inch needle injected into the left piriformis tendon  sheath with a total of 0.5 cc of 0.5% Marcaine plain injected with 6 cc of 3 centrifuge PRP into the tendon sheath.  This was done more proximal than previous 1. Completed without difficulty  Pain immediately resolved suggesting accurate placement of the medication.  Advised to call if fevers/chills, erythema, induration, drainage, or persistent bleeding.  Impression: Technically successful ultrasound guided injection.    Impression and Recommendations:     The above documentation has been reviewed and is accurate and complete [name], DO

## 2021-05-08 ENCOUNTER — Encounter: Payer: Self-pay | Admitting: Family Medicine

## 2021-05-11 ENCOUNTER — Other Ambulatory Visit: Payer: Self-pay

## 2021-05-11 ENCOUNTER — Ambulatory Visit (INDEPENDENT_AMBULATORY_CARE_PROVIDER_SITE_OTHER): Payer: Self-pay | Admitting: Family Medicine

## 2021-05-11 ENCOUNTER — Ambulatory Visit: Payer: Self-pay

## 2021-05-11 ENCOUNTER — Encounter: Payer: Self-pay | Admitting: Family Medicine

## 2021-05-11 VITALS — BP 142/90 | HR 91 | Ht 64.0 in | Wt 128.0 lb

## 2021-05-11 DIAGNOSIS — M25552 Pain in left hip: Secondary | ICD-10-CM

## 2021-05-11 NOTE — Patient Instructions (Addendum)
Good to see you  CoQ10 '200mg'$  daily Read about Glutathione  Go back to step one for post PRP Send me a message in two weeks  See me again in 6 weeks

## 2021-05-11 NOTE — Telephone Encounter (Signed)
Per pt phone call, disregard this message as she is feeling better and would like to proceed with 2nd PRP if appropriate.

## 2021-06-09 ENCOUNTER — Encounter: Payer: Self-pay | Admitting: Family Medicine

## 2021-06-16 DIAGNOSIS — Z23 Encounter for immunization: Secondary | ICD-10-CM | POA: Diagnosis not present

## 2021-06-17 DIAGNOSIS — Z23 Encounter for immunization: Secondary | ICD-10-CM | POA: Diagnosis not present

## 2021-06-21 NOTE — Progress Notes (Signed)
[place] [address] [place] Phone: [phone] Subjective:   [name], am serving as a scribe for Dr. Hulan Saas. This visit occurred during the SARS-CoV-2 public health emergency.  Safety protocols were in place, including screening questions prior to the visit, additional usage of staff PPE, and extensive cleaning of exam room while observing appropriate contact time as indicated for disinfecting solutions.   I'm seeing this patient by the request  of:  [name], MD  CC: Left piriformis pain follow-up  [id number]  Gatha Jentzen Barton is a 70 y.o. female coming in with complaint of L piriformis. Injected 05/11/2021. She would like PRP again today.  Patient feels like it has made improvement but unfortunately continues to have a little pain and feels that if she had 1 more injection and should not have the rest of it.  Patient has had good results with her PRP injections of the shoulder.     Past Medical History:  Diagnosis Date   Allergy    RHINTIS   Asthma    Fibrocystic disease of breast    Hemorrhoids    Hx of colonic polyps    Menopause    Mitral valve prolapse    Mitral valve prolapse    PONV (postoperative nausea and vomiting)    Pt asked to be careful with her teeth, due to them being brittle from low calcium   Primary hyperparathyroidism ([place])    right   Spinal headache    Past Surgical History:  Procedure Laterality Date   APPENDECTOMY  1964   COLONOSCOPY     CYSTOSTOMY W/ BLADDER BIOPSY     Dr. Rosana Hoes   HEMORRHOID SURGERY     Dr. Truitt Leep   NECK EXPLORATION  04/17/2017   with parathyroidectomy   PARATHYROIDECTOMY Right 01/02/2017   Procedure: RIGHT INFERIOR PARATHYROIDECTOMY;  Surgeon: Armandina Gemma, MD;  Location: [facility];  Service: General;  Laterality: Right;   PARATHYROIDECTOMY N/A 04/17/2017   Procedure: NECK EXPLORATION WITH PARATHYROIDECTOMY;  Surgeon: Armandina Gemma, MD;  Location: [facility];   Service: General;  Laterality: N/A;   THUMB FUSION  1997   Dr Maxie Better   TONSILLECTOMY AND ADENOIDECTOMY     tummy tuck     Social History   Socioeconomic History   Marital status: Married    Spouse name: Not on file   Number of children: 2   Years of education: Not on file   Highest education level: Not on file  Occupational History   Occupation: retired  Tobacco Use   Smoking status: Former    Packs/day: 0.25    Years: 10.00    Pack years: 2.50    Types: Cigarettes    Quit date: 06/04/1985    Years since quitting: 36.0   Smokeless tobacco: Never  Vaping Use   Vaping Use: Never used  Substance and Sexual Activity   Alcohol use: Yes    Alcohol/week: 5.0 standard drinks    Types: 5 Glasses of wine per week   Drug use: No   Sexual activity: Yes  Other Topics Concern   Not on file  Social History Narrative   Not on file   Social Determinants of Health   Financial Resource Strain: Not on file  Food Insecurity: Not on file  Transportation Needs: Not on file  Physical Activity: Not on file  Stress: Not on file  Social Connections: Not on file   Allergies  Allergen Reactions  Asa [Aspirin] Other (See Comments)    Minor bleeding   Family History  Problem Relation Age of Onset   Cancer Father 33       Lung cancer   Heart disease Father    Hyperlipidemia Father    Heart disease Mother 57       Sudden death presumed MI   Heart disease Brother 7       Presumed CHF   Heart disease Brother     Current Outpatient Medications (Endocrine & Metabolic):    levothyroxine (SYNTHROID) 88 MCG tablet, TAKE 1 TABLET(88 MCG) BY MOUTH DAILY BEFORE BREAKFAST   Current Outpatient Medications (Respiratory):    albuterol (VENTOLIN HFA) 108 (90 Base) MCG/ACT inhaler, INHALE 2 PUFFS INTO THE LUNGS EVERY 6 HOURS AS NEEDED FOR WHEEZING OR SHORTNESS OF BREATH   Current Outpatient Medications (Hematological):    vitamin B-12 (CYANOCOBALAMIN) 1000 MCG tablet, Take 1,000 mcg by mouth  daily.  Current Outpatient Medications (Other):    amoxicillin-clavulanate (AUGMENTIN) 875-125 MG tablet, Take 1 tablet by mouth 2 (two) times daily.   Cholecalciferol 50 MCG (2000 UT) CAPS, Take 2,000 Units by mouth daily.   Coenzyme Q10 (CO Q-10) 100 MG CAPS, Take 100 mg by mouth daily.   Magnesium 250 MG TABS, Take 250 mg by mouth daily.    Reviewed prior external information including notes and imaging from  primary care provider As well as notes that were available from care everywhere and other healthcare systems.  Past medical history, social, surgical and family history all reviewed in electronic medical record.  No pertanent information unless stated regarding to the chief complaint.   Review of Systems:  No headache, visual changes, nausea, vomiting, diarrhea, constipation, dizziness, abdominal pain, skin rash, fevers, chills, night sweats, weight loss, swollen lymph nodes, body aches, joint swelling, chest pain, shortness of breath, mood changes. POSITIVE muscle aches  Objective  Blood pressure 138/90, pulse 73, height '5\' 4"'$  (1.626 m), weight 131 lb (59.4 kg), SpO2 95 %.   General: No apparent distress alert and oriented x3 mood and affect normal, dressed appropriately.  HEENT: Pupils equal, extraocular movements intact  Respiratory: Patient's speak in full sentences and does not appear short of breath  Cardiovascular: No lower extremity edema, non tender, no erythema  Gait normal with good balance and coordination.  MSK: Left gluteal pain noted.  Seems to be more over the piriformis.  Still improved from previous exam.   Procedure: Real-time Ultrasound Guided Injection of left piriformis tendon sheath Device: GE Logiq Q7 Ultrasound guided injection is preferred based studies that show increased duration, increased effect, greater accuracy, decreased procedural pain, increased response rate, and decreased cost with ultrasound guided versus blind injection.  Verbal informed  consent obtained.  Time-out conducted.  Noted no overlying erythema, induration, or other signs of local infection.  Skin prepped in a sterile fashion.  Local anesthesia: Topical Ethyl chloride.  With sterile technique and under real time ultrasound guidance: With a 21-gauge 2 inch needle injected into the piriformis tendon sheath near the musculotendinous juncture proximally.  Patient had a total of 0.5 cc of 0.5% Marcaine and well as 5 cc of 3 centrifuge PRP into the tendon sheath.  No blood loss.  Postinjection instructions given. Completed without difficulty  Pain immediately resolved suggesting accurate placement of the medication.  Advised to call if fevers/chills, erythema, induration, drainage, or persistent bleeding.  Impression: Technically successful ultrasound guided injection.   Impression and Recommendations:     The  above documentation has been reviewed and is accurate and complete [name], DO

## 2021-06-22 ENCOUNTER — Ambulatory Visit (INDEPENDENT_AMBULATORY_CARE_PROVIDER_SITE_OTHER): Payer: Medicare Other | Admitting: Family Medicine

## 2021-06-22 ENCOUNTER — Other Ambulatory Visit: Payer: Self-pay

## 2021-06-22 ENCOUNTER — Ambulatory Visit: Payer: Self-pay

## 2021-06-22 ENCOUNTER — Ambulatory Visit (INDEPENDENT_AMBULATORY_CARE_PROVIDER_SITE_OTHER): Payer: Self-pay | Admitting: Family Medicine

## 2021-06-22 ENCOUNTER — Encounter: Payer: Self-pay | Admitting: Family Medicine

## 2021-06-22 VITALS — Ht 64.0 in

## 2021-06-22 VITALS — BP 138/90 | HR 73 | Ht 64.0 in | Wt 131.0 lb

## 2021-06-22 DIAGNOSIS — M545 Low back pain, unspecified: Secondary | ICD-10-CM | POA: Diagnosis not present

## 2021-06-22 DIAGNOSIS — D179 Benign lipomatous neoplasm, unspecified: Secondary | ICD-10-CM

## 2021-06-22 DIAGNOSIS — R2242 Localized swelling, mass and lump, left lower limb: Secondary | ICD-10-CM | POA: Diagnosis not present

## 2021-06-22 DIAGNOSIS — M5416 Radiculopathy, lumbar region: Secondary | ICD-10-CM | POA: Diagnosis not present

## 2021-06-22 DIAGNOSIS — G5702 Lesion of sciatic nerve, left lower limb: Secondary | ICD-10-CM

## 2021-06-22 NOTE — Assessment & Plan Note (Signed)
Patient is known physical therapy, has had multiple piriformis injections, continues to have difficulty difficulty with the pain and worsening pain in the back.  Do feel that this could be more radiculopathy from the lumbar spine and I do feel advanced imaging is warranted.  Patient will have this done and further evaluation.

## 2021-06-22 NOTE — Progress Notes (Signed)
[place] [address] [place] Phone: [phone] Subjective:    I'm seeing this patient by the request  of:  [name], MD  CC: Back pain and buttocks pain and mass on the left knee.  [id number]  Shannon [name] is a 70 y.o. female coming in with complaint of left hip exam as well as left hip pain and low back pain.  Patient states that she is having more discomfort and pain again.  More of a piriformis syndrome and patient has responded fairly well to the piriformis.  Unfortunately is having worsening pain again.  Patient is concerned because she would like to be active before and sometimes finds it difficult to be active secondary to the discomfort.  Patient is also having more of a mass on the lateral aspect of the left knee.  States it has been there for years.  Has been told that he has been a lipoma and has had other ones removed.  Patient states that this 1 is becoming significantly more painful.  Affecting daily activities.  Feels like she is changing her gait which is causing more discomfort in her back and hip.     Past Medical History:  Diagnosis Date   Allergy    RHINTIS   Asthma    Fibrocystic disease of breast    Hemorrhoids    Hx of colonic polyps    Menopause    Mitral valve prolapse    Mitral valve prolapse    PONV (postoperative nausea and vomiting)    Pt asked to be careful with her teeth, due to them being brittle from low calcium   Primary hyperparathyroidism ([place])    right   Spinal headache    Past Surgical History:  Procedure Laterality Date   APPENDECTOMY  1964   COLONOSCOPY     CYSTOSTOMY W/ BLADDER BIOPSY     Dr. Rosana Hoes   HEMORRHOID SURGERY     Dr. Truitt Leep   NECK EXPLORATION  04/17/2017   with parathyroidectomy   PARATHYROIDECTOMY Right 01/02/2017   Procedure: RIGHT INFERIOR PARATHYROIDECTOMY;  Surgeon: Armandina Gemma, MD;  Location: [facility];  Service: General;  Laterality: Right;    PARATHYROIDECTOMY N/A 04/17/2017   Procedure: NECK EXPLORATION WITH PARATHYROIDECTOMY;  Surgeon: Armandina Gemma, MD;  Location: [facility];  Service: General;  Laterality: N/A;   THUMB FUSION  1997   Dr Maxie Better   TONSILLECTOMY AND ADENOIDECTOMY     tummy tuck     Social History   Socioeconomic History   Marital status: Married    Spouse name: Not on file   Number of children: 2   Years of education: Not on file   Highest education level: Not on file  Occupational History   Occupation: retired  Tobacco Use   Smoking status: Former    Packs/day: 0.25    Years: 10.00    Pack years: 2.50    Types: Cigarettes    Quit date: 06/04/1985    Years since quitting: 36.0   Smokeless tobacco: Never  Vaping Use   Vaping Use: Never used  Substance and Sexual Activity   Alcohol use: Yes    Alcohol/week: 5.0 standard drinks    Types: 5 Glasses of wine per week   Drug use: No   Sexual activity: Yes  Other Topics Concern   Not on file  Social History Narrative   Not on file   Social Determinants of Health   Financial  Resource Strain: Not on file  Food Insecurity: Not on file  Transportation Needs: Not on file  Physical Activity: Not on file  Stress: Not on file  Social Connections: Not on file   Allergies  Allergen Reactions   Asa [Aspirin] Other (See Comments)    Minor bleeding   Family History  Problem Relation Age of Onset   Cancer Father 103       Lung cancer   Heart disease Father    Hyperlipidemia Father    Heart disease Mother 60       Sudden death presumed MI   Heart disease Brother 25       Presumed CHF   Heart disease Brother     Current Outpatient Medications (Endocrine & Metabolic):    levothyroxine (SYNTHROID) 88 MCG tablet, TAKE 1 TABLET(88 MCG) BY MOUTH DAILY BEFORE BREAKFAST   Current Outpatient Medications (Respiratory):    albuterol (VENTOLIN HFA) 108 (90 Base) MCG/ACT inhaler, INHALE 2 PUFFS INTO THE LUNGS EVERY 6 HOURS AS NEEDED FOR WHEEZING OR SHORTNESS OF  BREATH   Current Outpatient Medications (Hematological):    vitamin B-12 (CYANOCOBALAMIN) 1000 MCG tablet, Take 1,000 mcg by mouth daily.  Current Outpatient Medications (Other):    amoxicillin-clavulanate (AUGMENTIN) 875-125 MG tablet, Take 1 tablet by mouth 2 (two) times daily.   Cholecalciferol 50 MCG (2000 UT) CAPS, Take 2,000 Units by mouth daily.   Coenzyme Q10 (CO Q-10) 100 MG CAPS, Take 100 mg by mouth daily.   Magnesium 250 MG TABS, Take 250 mg by mouth daily.    Reviewed prior external information including notes and imaging from  primary care provider As well as notes that were available from care everywhere and other healthcare systems.  Past medical history, social, surgical and family history all reviewed in electronic medical record.  No pertanent information unless stated regarding to the chief complaint.   Review of Systems:  No headache, visual changes, nausea, vomiting, diarrhea, constipation, dizziness, abdominal pain, skin rash, fevers, chills, night sweats, weight loss, swollen lymph nodes, body aches, joint swelling, chest pain, shortness of breath, mood changes. POSITIVE muscle aches  Objective  Height '5\' 4"'$  (1.626 m).   General: No apparent distress alert and oriented x3 mood and affect normal, dressed appropriately.  HEENT: Pupils equal, extraocular movements intact  Respiratory: Patient's speak in full sentences and does not appear short of breath  Cardiovascular: No lower extremity edema, non tender, no erythema  Gait normal with good balance and coordination.  MSK: Neck exam does show some mild loss of lordosis.  Patient does have positive straight leg test on the left side.  Patient does have 5 strength on the right compared to left.  Patient's left knee has good range of motion.  Mild lateral tracking of the patella noted.  Patient does have what appears to be a movable severely tender cyst over the lateral joint line of the knee more posteriorly.   Consistent with potential lipoma.   Impression and Recommendations:     The above documentation has been reviewed and is accurate and complete [name], DO

## 2021-06-22 NOTE — Patient Instructions (Signed)
Good to see you! You know the PRP routine [facility] [phone] Call Today  When we receive your results we will contact you.

## 2021-06-22 NOTE — Assessment & Plan Note (Signed)
Repeat injection given.  Discussed icing regimen and home exercises.  Increase activity slowly.  Follow-up again in 6 to 8 weeks

## 2021-06-22 NOTE — Assessment & Plan Note (Signed)
History of multiple lipomas.  Patient wanting to have another one on the knee.  We will refer her appropriately to have likely removal with the secondary to amount of pain that patient is having even to light sensation.  I do think also an MRI of the knee is necessary to further evaluate the cyst.  This is likely secondary to the positioning and the amount of pain that this could be more of the sural nerve and help with planning on any surgical intervention necessary.

## 2021-07-05 ENCOUNTER — Ambulatory Visit
Admission: RE | Admit: 2021-07-05 | Discharge: 2021-07-05 | Disposition: A | Discharge status: Home / Self Care | Payer: Medicare Other | Source: Ambulatory Visit | Attending: Family Medicine | Admitting: Family Medicine

## 2021-07-05 DIAGNOSIS — M545 Low back pain, unspecified: Secondary | ICD-10-CM | POA: Diagnosis not present

## 2021-07-05 DIAGNOSIS — M67462 Ganglion, left knee: Secondary | ICD-10-CM | POA: Diagnosis not present

## 2021-07-05 DIAGNOSIS — R2242 Localized swelling, mass and lump, left lower limb: Secondary | ICD-10-CM

## 2021-07-05 DIAGNOSIS — M48061 Spinal stenosis, lumbar region without neurogenic claudication: Secondary | ICD-10-CM | POA: Diagnosis not present

## 2021-07-05 DIAGNOSIS — M25462 Effusion, left knee: Secondary | ICD-10-CM | POA: Diagnosis not present

## 2021-07-05 MED ORDER — GADOBENATE DIMEGLUMINE 529 MG/ML IV SOLN
12.0000 mL | Freq: Once | INTRAVENOUS | Status: AC | PRN
  Administered 2021-07-05: 12 mL via INTRAVENOUS

## 2021-07-07 ENCOUNTER — Other Ambulatory Visit: Payer: Self-pay

## 2021-07-07 DIAGNOSIS — R2242 Localized swelling, mass and lump, left lower limb: Secondary | ICD-10-CM

## 2021-07-14 DIAGNOSIS — M25562 Pain in left knee: Secondary | ICD-10-CM | POA: Diagnosis not present

## 2021-07-19 ENCOUNTER — Telehealth: Payer: Self-pay | Admitting: Family Medicine

## 2021-07-19 NOTE — Telephone Encounter (Signed)
Pt called and states that she had a Prevnar 13 last year and was informed that she should have had Prenar 23. She states she was told to get the correct one in an year. Please verify and advise pt at [phone].

## 2021-07-19 NOTE — Telephone Encounter (Signed)
Pt was advised that she is due for Prev. 23 and can have it done with a nurse visit.

## 2021-07-22 ENCOUNTER — Other Ambulatory Visit: Payer: Self-pay

## 2021-07-22 ENCOUNTER — Encounter: Payer: Self-pay | Admitting: Family Medicine

## 2021-07-22 DIAGNOSIS — R2242 Localized swelling, mass and lump, left lower limb: Secondary | ICD-10-CM

## 2021-07-27 ENCOUNTER — Encounter: Payer: Self-pay | Admitting: Family Medicine

## 2021-08-02 ENCOUNTER — Encounter: Payer: Self-pay | Admitting: Family Medicine

## 2021-08-02 ENCOUNTER — Other Ambulatory Visit: Payer: Self-pay

## 2021-08-02 DIAGNOSIS — M5416 Radiculopathy, lumbar region: Secondary | ICD-10-CM

## 2021-08-04 ENCOUNTER — Ambulatory Visit
Admission: RE | Admit: 2021-08-04 | Discharge: 2021-08-04 | Disposition: A | Discharge status: Home / Self Care | Payer: Medicare Other | Source: Ambulatory Visit | Attending: Family Medicine | Admitting: Family Medicine

## 2021-08-04 DIAGNOSIS — M4727 Other spondylosis with radiculopathy, lumbosacral region: Secondary | ICD-10-CM | POA: Diagnosis not present

## 2021-08-04 DIAGNOSIS — M5416 Radiculopathy, lumbar region: Secondary | ICD-10-CM

## 2021-08-04 MED ORDER — IOPAMIDOL (ISOVUE-M 200) INJECTION 41%
1.0000 mL | Freq: Once | INTRAMUSCULAR | Status: AC
  Administered 2021-08-04: 1 mL via EPIDURAL

## 2021-08-04 MED ORDER — METHYLPREDNISOLONE ACETATE 40 MG/ML INJ SUSP (RADIOLOG
80.0000 mg | Freq: Once | INTRAMUSCULAR | Status: AC
  Administered 2021-08-04: 80 mg via EPIDURAL

## 2021-08-04 NOTE — Discharge Instructions (Signed)

## 2021-08-05 ENCOUNTER — Encounter: Payer: Self-pay | Admitting: Family Medicine

## 2021-08-10 DIAGNOSIS — Z1231 Encounter for screening mammogram for malignant neoplasm of breast: Secondary | ICD-10-CM | POA: Diagnosis not present

## 2021-08-16 ENCOUNTER — Telehealth: Payer: Self-pay | Admitting: Family Medicine

## 2021-08-16 ENCOUNTER — Other Ambulatory Visit: Payer: Self-pay

## 2021-08-16 DIAGNOSIS — M6740 Ganglion, unspecified site: Secondary | ICD-10-CM

## 2021-08-16 DIAGNOSIS — M25562 Pain in left knee: Secondary | ICD-10-CM

## 2021-08-16 NOTE — Telephone Encounter (Signed)
Referral placed to [facility] per Dr. Tamala Julian.

## 2021-08-16 NOTE — Telephone Encounter (Signed)
[facility] called, they do NOT deal with knees and recommend Ortho for this referral.

## 2021-08-24 ENCOUNTER — Ambulatory Visit (INDEPENDENT_AMBULATORY_CARE_PROVIDER_SITE_OTHER): Payer: Medicare Other | Admitting: Orthopaedic Surgery

## 2021-08-24 ENCOUNTER — Other Ambulatory Visit: Payer: Self-pay

## 2021-08-24 ENCOUNTER — Encounter: Payer: Self-pay | Admitting: Orthopaedic Surgery

## 2021-08-24 DIAGNOSIS — R2232 Localized swelling, mass and lump, left upper limb: Secondary | ICD-10-CM

## 2021-08-24 DIAGNOSIS — R2242 Localized swelling, mass and lump, left lower limb: Secondary | ICD-10-CM | POA: Diagnosis not present

## 2021-08-24 NOTE — Progress Notes (Signed)
Office Visit Note   Patient: Shannon [name]           Date of Birth: May 23, 1951           MRN: [id number] Visit Date: 08/24/2021              Requested by: [name], DO [place],  [place] [address] PCP: [name], MD   Assessment & Plan: Visit Diagnoses:  1. Subcutaneous nodule of left forearm   2. Mass of left knee     Plan: Impression is painful nodules of the left forearm and left knee.  She did have an MRI of the left knee which ruled out structural abnormality overlying the region.  Given failure of conservative management she has elected for surgical excision of both nodules.  We will plan to do this in the near future.  I risk benefits rehab recovery reviewed with the patient.  Follow-Up Instructions: No follow-ups on file.   Orders:  No orders of the defined types were placed in this encounter.  No orders of the defined types were placed in this encounter.     Procedures: No procedures performed   Clinical Data: No additional findings.   Subjective: Chief Complaint  Patient presents with   Left Knee - Pain   Left Elbow - Pain    Shannon [name] is a very pleasant 70 year old female referral from Dr. Tamala Julian for multiple painful nodules specifically left forearm and left knee.  She has had nodules taken out of her body in the past.  These have been removed and negative for malignancy.   Review of Systems  Constitutional: Negative.   HENT: Negative.    Eyes: Negative.   Respiratory: Negative.    Cardiovascular: Negative.   Endocrine: Negative.   Musculoskeletal: Negative.   Neurological: Negative.   Hematological: Negative.   Psychiatric/Behavioral: Negative.    All other systems reviewed and are negative.   Objective: Vital Signs: There were no vitals taken for this visit.  Physical Exam Vitals and nursing note reviewed.  Constitutional:      Appearance: She is well-developed.  Pulmonary:     Effort: Pulmonary effort is  normal.  Skin:    General: Skin is warm.     Capillary Refill: Capillary refill takes less than 2 seconds.  Neurological:     Mental Status: She is alert and oriented to person, place, and time.  Psychiatric:        Behavior: Behavior normal.        Thought Content: Thought content normal.        Judgment: Judgment normal.    Ortho Exam  Left forearm shows a mobile nodule on the dorsal surface that is painful with palpation.  Left knee shows a painful nodule to the posterior lateral aspect that this painful with palpation. Specialty Comments:  No specialty comments available.  Imaging: No results found.   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   Osteoporosis 06/04/2012   Asthma 03/22/2011   Panic attacks 03/22/2011   Past Medical  History:  Diagnosis Date   Allergy    RHINTIS   Asthma    Fibrocystic disease of breast    Hemorrhoids    Hx of colonic polyps    Menopause    Mitral valve prolapse    Mitral valve prolapse    PONV (postoperative nausea and vomiting)    Pt asked to be careful with her teeth, due to them being brittle from low calcium   Primary hyperparathyroidism (HCC)    right   Spinal headache     Family History  Problem Relation Age of Onset   Cancer Father 39       Lung cancer   Heart disease Father    Hyperlipidemia Father    Heart disease Mother 51       Sudden death presumed MI   Heart disease Brother 28       Presumed CHF   Heart disease Brother     Past Surgical History:  Procedure  Laterality Date   APPENDECTOMY  1964   COLONOSCOPY     CYSTOSTOMY W/ BLADDER BIOPSY     Dr. Rosana Hoes   HEMORRHOID SURGERY     Dr. Truitt Leep   NECK EXPLORATION  04/17/2017   with parathyroidectomy   PARATHYROIDECTOMY Right 01/02/2017   Procedure: RIGHT INFERIOR PARATHYROIDECTOMY;  Surgeon: Armandina Gemma, MD;  Location: [facility];  Service: General;  Laterality: Right;   PARATHYROIDECTOMY N/A 04/17/2017   Procedure: NECK EXPLORATION WITH PARATHYROIDECTOMY;  Surgeon: Armandina Gemma, MD;  Location: [facility];  Service: General;  Laterality: N/A;   THUMB FUSION  1997   Dr Maxie Better   TONSILLECTOMY AND ADENOIDECTOMY     tummy tuck     Social History   Occupational History   Occupation: retired  Tobacco Use   Smoking status: Former    Packs/day: 0.25    Years: 10.00    Pack years: 2.50    Types: Cigarettes    Quit date: 06/04/1985    Years since quitting: 36.2   Smokeless tobacco: Never  Vaping Use   Vaping Use: Never used  Substance and Sexual Activity   Alcohol use: Yes    Alcohol/week: 5.0 standard drinks    Types: 5 Glasses of wine per week   Drug use: No   Sexual activity: Yes

## 2021-09-04 ENCOUNTER — Encounter: Payer: Self-pay | Admitting: Family Medicine

## 2021-09-06 ENCOUNTER — Other Ambulatory Visit: Payer: Self-pay

## 2021-09-06 DIAGNOSIS — M545 Low back pain, unspecified: Secondary | ICD-10-CM

## 2021-09-07 ENCOUNTER — Ambulatory Visit
Admission: RE | Admit: 2021-09-07 | Discharge: 2021-09-07 | Disposition: A | Discharge status: Home / Self Care | Payer: Medicare Other | Source: Ambulatory Visit | Attending: Family Medicine | Admitting: Family Medicine

## 2021-09-07 DIAGNOSIS — M79605 Pain in left leg: Secondary | ICD-10-CM | POA: Diagnosis not present

## 2021-09-07 DIAGNOSIS — M545 Low back pain, unspecified: Secondary | ICD-10-CM

## 2021-09-07 MED ORDER — IOPAMIDOL (ISOVUE-M 200) INJECTION 41%
1.0000 mL | Freq: Once | INTRAMUSCULAR | Status: AC
  Administered 2021-09-07: 1 mL via EPIDURAL

## 2021-09-07 MED ORDER — METHYLPREDNISOLONE ACETATE 40 MG/ML INJ SUSP (RADIOLOG
80.0000 mg | Freq: Once | INTRAMUSCULAR | Status: AC
  Administered 2021-09-07: 80 mg via EPIDURAL

## 2021-09-07 NOTE — Discharge Instructions (Signed)

## 2021-09-27 ENCOUNTER — Other Ambulatory Visit: Payer: Self-pay | Admitting: Physician Assistant

## 2021-09-27 MED ORDER — ONDANSETRON HCL 4 MG PO TABS: 4.0000 mg | ORAL_TABLET | Freq: Three times a day (TID) | ORAL | 0 refills | Status: DC | PRN

## 2021-09-27 MED ORDER — HYDROCODONE-ACETAMINOPHEN 5-325 MG PO TABS: 1.0000 | ORAL_TABLET | Freq: Three times a day (TID) | ORAL | 0 refills | Status: DC | PRN

## 2021-09-30 ENCOUNTER — Encounter: Payer: Self-pay | Admitting: Orthopaedic Surgery

## 2021-09-30 ENCOUNTER — Other Ambulatory Visit: Payer: Self-pay

## 2021-09-30 DIAGNOSIS — D1724 Benign lipomatous neoplasm of skin and subcutaneous tissue of left leg: Secondary | ICD-10-CM | POA: Diagnosis not present

## 2021-09-30 DIAGNOSIS — D1722 Benign lipomatous neoplasm of skin and subcutaneous tissue of left arm: Secondary | ICD-10-CM | POA: Diagnosis not present

## 2021-10-13 ENCOUNTER — Encounter: Payer: Self-pay | Admitting: Orthopaedic Surgery

## 2021-10-13 ENCOUNTER — Other Ambulatory Visit: Payer: Self-pay

## 2021-10-13 ENCOUNTER — Ambulatory Visit (INDEPENDENT_AMBULATORY_CARE_PROVIDER_SITE_OTHER): Payer: Medicare Other | Admitting: Physician Assistant

## 2021-10-13 DIAGNOSIS — D1722 Benign lipomatous neoplasm of skin and subcutaneous tissue of left arm: Secondary | ICD-10-CM

## 2021-10-13 DIAGNOSIS — D1720 Benign lipomatous neoplasm of skin and subcutaneous tissue of unspecified limb: Secondary | ICD-10-CM

## 2021-10-13 NOTE — Progress Notes (Signed)
Post-Op Visit Note   Patient: Shannon [name]           Date of Birth: [birth year]           MRN: [id number] Visit Date: 10/13/2021 PCP: [name], MD   Assessment & Plan:  Chief Complaint:  Chief Complaint  Patient presents with   Left Forearm - Routine Post Op   Left Knee - Routine Post Op   Visit Diagnoses:  1. Lipoma of left forearm   2. Lipoma of lower leg     Plan: Patient is a pleasant 71-year-old female who comes in today 2 weeks status post excision left forearm lipoma and left lower leg lipoma.  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  The patient is doing well without pain or other complaints.  She does note that she was having tingling to her left foot prior to surgery and this is resolved.  Examination of her left forearm and left lower leg show well-healed surgical incisions without evidence of infection.  Nylon sutures in place.  She is neurovascular intact distally.  Today, sutures removed and Steri-Strips applied.  She may increase activity as tolerated.  Follow-up with [place] in 4 weeks time for recheck.  Call with concerns or questions.  Follow-Up Instructions: Return in about 4 weeks (around 11/10/2021).   Orders:  No orders of the defined types were placed in this encounter.  No orders of the defined types were placed in this encounter.   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    Osteoporosis 06/04/2012   Asthma 03/22/2011   Panic attacks 03/22/2011   Past Medical History:  Diagnosis Date   Allergy    RHINTIS   Asthma    Fibrocystic disease of breast    Hemorrhoids    Hx of colonic polyps    Menopause    Mitral valve prolapse    Mitral valve prolapse    PONV (postoperative nausea and vomiting)    Pt asked to be careful with her teeth, due to them being brittle from low calcium   Primary hyperparathyroidism ([place])    right   Spinal headache     Family History  Problem Relation Age of Onset   Cancer Father 41       Lung cancer   Heart disease Father    Hyperlipidemia Father    Heart disease Mother 26       Sudden death presumed MI   Heart disease Brother 64       Presumed CHF   Heart disease Brother     Past Surgical History:  Procedure Laterality Date   APPENDECTOMY  1964   COLONOSCOPY     CYSTOSTOMY W/ BLADDER BIOPSY     Dr. Rosana Hoes   HEMORRHOID SURGERY     Dr. Truitt Leep   NECK EXPLORATION  04/17/2017   with parathyroidectomy   PARATHYROIDECTOMY Right 01/02/2017   Procedure: RIGHT INFERIOR  PARATHYROIDECTOMY;  Surgeon: Armandina Gemma, MD;  Location: [facility];  Service: General;  Laterality: Right;   PARATHYROIDECTOMY N/A 04/17/2017   Procedure: NECK EXPLORATION WITH PARATHYROIDECTOMY;  Surgeon: Armandina Gemma, MD;  [facility];  Service: General;  Laterality: N/A;   THUMB FUSION  1997   Dr Maxie Better   TONSILLECTOMY AND ADENOIDECTOMY     tummy tuck     Social History   Occupational History   Occupation: retired  Tobacco Use   Smoking status: Former    Packs/day: 0.25    Years: 10.00    Pack years: 2.50    Types: Cigarettes    Quit date: 06/04/1985    Years since quitting: 36.3   Smokeless tobacco: Never  Vaping Use   Vaping Use: Never used  Substance and Sexual Activity   Alcohol use: Yes    Alcohol/week: 5.0 standard drinks    Types: 5 Glasses of wine per week   Drug use: No   Sexual activity: Yes

## 2021-10-13 NOTE — Progress Notes (Signed)
lef

## 2021-11-02 DIAGNOSIS — D225 Melanocytic nevi of trunk: Secondary | ICD-10-CM | POA: Diagnosis not present

## 2021-11-02 DIAGNOSIS — L814 Other melanin hyperpigmentation: Secondary | ICD-10-CM | POA: Diagnosis not present

## 2021-11-02 DIAGNOSIS — L988 Other specified disorders of the skin and subcutaneous tissue: Secondary | ICD-10-CM | POA: Diagnosis not present

## 2021-11-02 DIAGNOSIS — L821 Other seborrheic keratosis: Secondary | ICD-10-CM | POA: Diagnosis not present

## 2021-11-10 ENCOUNTER — Encounter: Payer: Medicare Other | Admitting: Orthopaedic Surgery

## 2021-11-11 NOTE — Progress Notes (Signed)
[facility] Phone: [phone] Subjective:   Shannon [name], am serving as a scribe for Dr. Hulan Saas. This visit occurred during the SARS-CoV-2 public health emergency.  Safety protocols were in place, including screening questions prior to the visit, additional usage of staff PPE, and extensive cleaning of exam room while observing appropriate contact time as indicated for disinfecting solutions.   I'm seeing this patient by the request  of:  [name], MD  CC: low back pain f/u  [id number]  06/22/2021 Patient is known physical therapy, has had multiple piriformis injections, continues to have difficulty difficulty with the pain and worsening pain in the back.  Do feel that this could be more radiculopathy from the lumbar spine and I do feel advanced imaging is warranted.  Patient will have this done and further evaluation.  Update 11/16/2021 Shannon [name] is a 71 y.o. female coming in with complaint of lumbar spine and L knee pain. Patient states that she continues to have L glute pain and L knee pain. Unable to stand, sit, lie down due to pain. Knee pain is worse than L hip pain. Did not feel that epidurals have helped. Last one in November 2022.       Past Medical History:  Diagnosis Date   Allergy    RHINTIS   Asthma    Fibrocystic disease of breast    Hemorrhoids    Hx of colonic polyps    Menopause    Mitral valve prolapse    Mitral valve prolapse    PONV (postoperative nausea and vomiting)    Pt asked to be careful with her teeth, due to them being brittle from low calcium   Primary hyperparathyroidism ([place])    right   Spinal headache    Past Surgical History:  Procedure Laterality Date   APPENDECTOMY  1964   COLONOSCOPY     CYSTOSTOMY W/ BLADDER BIOPSY     Dr. Rosana Hoes   HEMORRHOID SURGERY     Dr. Truitt Leep   NECK EXPLORATION  04/17/2017   with parathyroidectomy    PARATHYROIDECTOMY Right 01/02/2017   Procedure: RIGHT INFERIOR PARATHYROIDECTOMY;  Surgeon: Armandina Gemma, MD;  Location: [facility];  Service: General;  Laterality: Right;   PARATHYROIDECTOMY N/A 04/17/2017   Procedure: NECK EXPLORATION WITH PARATHYROIDECTOMY;  Surgeon: Armandina Gemma, MD;  Location: [facility];  Service: General;  Laterality: N/A;   THUMB FUSION  1997   Dr Maxie Better   TONSILLECTOMY AND ADENOIDECTOMY     tummy tuck     Social History   Socioeconomic History   Marital status: Married    Spouse name: Not on file   Number of children: 2   Years of education: Not on file   Highest education level: Not on file  Occupational History   Occupation: retired  Tobacco Use   Smoking status: Former    Packs/day: 0.25    Years: 10.00    Pack years: 2.50    Types: Cigarettes    Quit date: 06/04/1985    Years since quitting: 36.4   Smokeless tobacco: Never  Vaping Use   Vaping Use: Never used  Substance and Sexual Activity   Alcohol use: Yes    Alcohol/week: 5.0 standard drinks    Types: 5 Glasses of wine per week   Drug use: [name]   Sexual activity: Yes  Other Topics Concern   Not on file  Social History Narrative  Not on file   Social Determinants of Health   Financial Resource Strain: Not on file  Food Insecurity: Not on file  Transportation Needs: Not on file  Physical Activity: Not on file  Stress: Not on file  Social Connections: Not on file   Allergies  Allergen Reactions   Asa [Aspirin] Other (See Comments)    Minor bleeding   Family History  Problem Relation Age of Onset   Cancer Father 43       Lung cancer   Heart disease Father    Hyperlipidemia Father    Heart disease Mother 62       Sudden death presumed MI   Heart disease Brother 54       Presumed CHF   Heart disease Brother     Current Outpatient Medications (Endocrine & Metabolic):    levothyroxine (SYNTHROID) 88 MCG tablet, TAKE 1 TABLET(88 MCG) BY MOUTH DAILY BEFORE BREAKFAST   Current Outpatient  Medications (Respiratory):    albuterol (VENTOLIN HFA) 108 (90 Base) MCG/ACT inhaler, INHALE 2 PUFFS INTO THE LUNGS EVERY 6 HOURS AS NEEDED FOR WHEEZING OR SHORTNESS OF BREATH  Current Outpatient Medications (Analgesics):    HYDROcodone-acetaminophen (NORCO) 5-325 MG tablet, Take 1-2 tablets by mouth 3 (three) times daily as needed. To be taken after surgery  Current Outpatient Medications (Hematological):    vitamin B-12 (CYANOCOBALAMIN) 1000 MCG tablet, Take 1,000 mcg by mouth daily.  Current Outpatient Medications (Other):    Cholecalciferol 50 MCG (2000 UT) CAPS, Take 2,000 Units by mouth daily.   Coenzyme Q10 (CO Q-10) 100 MG CAPS, Take 100 mg by mouth daily.   Magnesium 250 MG TABS, Take 250 mg by mouth daily.    ondansetron (ZOFRAN) 4 MG tablet, Take 1 tablet (4 mg total) by mouth every 8 (eight) hours as needed for nausea or vomiting.   Reviewed prior external information including notes and imaging from  primary care provider As well as notes that were available from care everywhere and other healthcare systems.  Past medical history, social, surgical and family history all reviewed in electronic medical record.  [name] pertanent information unless stated regarding to the chief complaint.   Review of Systems:  [name] headache, visual changes, nausea, vomiting, diarrhea, constipation, dizziness, abdominal pain, skin rash, fevers, chills, night sweats, weight loss, swollen lymph nodes, body aches, joint swelling, chest pain, shortness of breath, mood changes. POSITIVE muscle aches  Objective  Blood pressure 132/90, pulse 77, height 5\' 4"  (1.626 m), weight 135 lb (61.2 kg), SpO2 98 %.   General: [name] apparent distress alert and oriented x3 mood and affect normal, dressed appropriately.  HEENT: Pupils equal, extraocular movements intact  Respiratory: Patient's speak in full sentences and does not appear short of breath  Cardiovascular: [name] lower extremity edema, non tender, [name] erythema   Gait normal with good balance and coordination.  MSK: Low back exam does have the Loss of lordosis.  Severe tenderness over the sacroiliac joint noted.  [name] masses appreciated in this area.  Positive FABER test with negative straight leg test noted today.  After verbal consent patient was prepped with alcohol swab and with the aid of a 21-gauge 2 inch needle patient was injected with 0.5 cc of 0.5% Marcaine and 1 cc of Kenalog 40 mg/mL into the left SI joint.  [name] blood loss.  Band-Aid placed.  Postinjection instructions given    Impression and Recommendations:     The above documentation has been reviewed and is accurate and  complete [name], DO

## 2021-11-16 ENCOUNTER — Encounter: Payer: Self-pay | Admitting: Neurology

## 2021-11-16 ENCOUNTER — Ambulatory Visit (INDEPENDENT_AMBULATORY_CARE_PROVIDER_SITE_OTHER): Payer: Medicare Other | Admitting: Family Medicine

## 2021-11-16 ENCOUNTER — Other Ambulatory Visit: Payer: Self-pay

## 2021-11-16 ENCOUNTER — Ambulatory Visit (INDEPENDENT_AMBULATORY_CARE_PROVIDER_SITE_OTHER): Payer: Medicare Other

## 2021-11-16 ENCOUNTER — Encounter: Payer: Self-pay | Admitting: Family Medicine

## 2021-11-16 VITALS — BP 132/90 | HR 77 | Ht 64.0 in | Wt 135.0 lb

## 2021-11-16 DIAGNOSIS — M25552 Pain in left hip: Secondary | ICD-10-CM

## 2021-11-16 DIAGNOSIS — M533 Sacrococcygeal disorders, not elsewhere classified: Secondary | ICD-10-CM | POA: Diagnosis not present

## 2021-11-16 NOTE — Assessment & Plan Note (Signed)
Attempted injection today.  Concerned the patient could be having more of a nerve evaluation and is contributing.  This is difficult with patient not responding as much to the epidurals.  We will get nerve conduction study.  Has had difficulty with parathyroidectomy and difficulty with calcium.  Patient does have good vascularity of the legs so do not believe that it is playing a role.  This continues to give [place] trouble may need to rule out possible lipoma or ganglion cyst such as what patient has had in her knee that could be also contributing in the piriformis or other vicinity.  Patient has not had any significant weight loss, no fevers or chills or any other abnormal fever.  Patient will follow-up with me again in 4 weeks otherwise.

## 2021-11-16 NOTE — Patient Instructions (Addendum)
Nerve conduction study ordered Depending on that we may need to do an MRI See you again in 4 weeks

## 2021-11-17 ENCOUNTER — Encounter: Payer: Self-pay | Admitting: Family Medicine

## 2021-11-19 ENCOUNTER — Encounter: Payer: Self-pay | Admitting: Family Medicine

## 2021-11-29 ENCOUNTER — Ambulatory Visit: Payer: Medicare Other | Admitting: Family Medicine

## 2021-12-09 ENCOUNTER — Encounter: Payer: Self-pay | Admitting: Family Medicine

## 2021-12-09 NOTE — Progress Notes (Signed)
?Shannon [name] D.O. ?[facility] ?[place] ?Phone: [phone] ?Subjective:   ? ?I'm seeing this patient by the request  of:  [name], MD ? ?CC: Sacroiliac pain, left knee pain ? ?[id number]  ?11/16/2021 ?Attempted injection today.  Concerned the patient could be having more of a nerve evaluation and is contributing.  This is difficult with patient not responding as much to the epidurals.  We will get nerve conduction study.  Has had difficulty with parathyroidectomy and difficulty with calcium.  Patient does have good vascularity of the legs so do not believe that it is playing a role.  This continues to give [place] trouble may need to rule out possible lipoma or ganglion cyst such as what patient has had in her knee that could be also contributing in the piriformis or other vicinity.  Patient has not had any significant weight loss, no fevers or chills or any other abnormal fever.  Patient will follow-up with me again in 4 weeks otherwise. ? ?Update 12/14/2021 ?Shannon [name] is a 71 y.o. female coming in with complaint of SI joint pain. Patient states that she felt a lot of relief from SI joint cortisone injection.  ? ?Patient also c/o numbness in lateral left leg into the knee. Pain increases in knee with stair climbing and wonders if this pain is independent from back. Notes MRI reading of popliteus tendonitis. Patient would like to do PRP for knee as well if her symptoms at this joint are not from radiculopathy.  ? ? ? ?  ? ?Past Medical History:  ?Diagnosis Date  ? Allergy   ? RHINTIS  ? Asthma   ? Fibrocystic disease of breast   ? Hemorrhoids   ? Hx of colonic polyps   ? Menopause   ? Mitral valve prolapse   ? Mitral valve prolapse   ? PONV (postoperative nausea and vomiting)   ? Pt asked to be careful with her teeth, due to them being brittle from low calcium  ? Primary hyperparathyroidism ([place])   ? right  ? Spinal headache   ? ?Past Surgical History:  ?Procedure  Laterality Date  ? APPENDECTOMY  1964  ? COLONOSCOPY    ? CYSTOSTOMY W/ BLADDER BIOPSY    ? Dr. Rosana Hoes  ? HEMORRHOID SURGERY    ? Dr. Truitt Leep  ? NECK EXPLORATION  04/17/2017  ? with parathyroidectomy  ? PARATHYROIDECTOMY Right 01/02/2017  ? Procedure: RIGHT INFERIOR PARATHYROIDECTOMY;  Surgeon: Armandina Gemma, MD;  Location: [facility];  Service: General;  Laterality: Right;  ? PARATHYROIDECTOMY N/A 04/17/2017  ? Procedure: NECK EXPLORATION WITH PARATHYROIDECTOMY;  Surgeon: Armandina Gemma, MD;  Location: [facility];  Service: General;  Laterality: N/A;  ? [place]  ? Dr Maxie Better  ? TONSILLECTOMY AND ADENOIDECTOMY    ? tummy tuck    ? ?Social History  ? ?Socioeconomic History  ? Marital status: Married  ?  Spouse name: Not on file  ? Number of children: 2  ? Years of education: Not on file  ? Highest education level: Not on file  ?Occupational History  ? Occupation: retired  ?Tobacco Use  ? Smoking status: Former  ?  Packs/day: 0.25  ?  Years: 10.00  ?  Pack years: 2.50  ?  Types: Cigarettes  ?  Quit date: 06/04/1985  ?  Years since quitting: 36.5  ? Smokeless tobacco: Never  ?Vaping Use  ? Vaping Use: Never used  ?Substance and  Sexual Activity  ? Alcohol use: Yes  ?  Alcohol/week: 5.0 standard drinks  ?  Types: 5 Glasses of wine per week  ? Drug use: No  ? Sexual activity: Yes  ?Other Topics Concern  ? Not on file  ?Social History Narrative  ? Not on file  ? ?Social Determinants of Health  ? ?Financial Resource Strain: Not on file  ?Food Insecurity: Not on file  ?Transportation Needs: Not on file  ?Physical Activity: Not on file  ?Stress: Not on file  ?Social Connections: Not on file  ? ?Allergies  ?Allergen Reactions  ? Asa [Aspirin] Other (See Comments)  ?  Minor bleeding  ? ?Family History  ?Problem Relation Age of Onset  ? Cancer Father 77  ?     Lung cancer  ? Heart disease Father   ? Hyperlipidemia Father   ? Heart disease Mother 46  ?     Sudden death presumed MI  ? Heart disease Brother 61  ?     Presumed CHF  ? Heart  disease Brother   ? ? ?Current Outpatient Medications (Endocrine & Metabolic):  ?  levothyroxine (SYNTHROID) 88 MCG tablet, TAKE 1 TABLET(88 MCG) BY MOUTH DAILY BEFORE BREAKFAST ? ? ?Current Outpatient Medications (Respiratory):  ?  albuterol (VENTOLIN HFA) 108 (90 Base) MCG/ACT inhaler, INHALE 2 PUFFS INTO THE LUNGS EVERY 6 HOURS AS NEEDED FOR WHEEZING OR SHORTNESS OF BREATH ? ?Current Outpatient Medications (Analgesics):  ?  HYDROcodone-acetaminophen (NORCO) 5-325 MG tablet, Take 1-2 tablets by mouth 3 (three) times daily as needed. To be taken after surgery ? ?Current Outpatient Medications (Hematological):  ?  vitamin B-12 (CYANOCOBALAMIN) 1000 MCG tablet, Take 1,000 mcg by mouth daily. ? ?Current Outpatient Medications (Other):  ?  Cholecalciferol 50 MCG (2000 UT) CAPS, Take 2,000 Units by mouth daily. ?  Coenzyme Q10 (CO Q-10) 100 MG CAPS, Take 100 mg by mouth daily. ?  Magnesium 250 MG TABS, Take 250 mg by mouth daily.  ?  ondansetron (ZOFRAN) 4 MG tablet, Take 1 tablet (4 mg total) by mouth every 8 (eight) hours as needed for nausea or vomiting. ? ? ?Reviewed prior external information including notes and imaging from  ?primary care provider ?As well as notes that were available from care everywhere and other healthcare systems. ? ?Past medical history, social, surgical and family history all reviewed in electronic medical record.  No pertanent information unless stated regarding to the chief complaint.  ? ?Review of Systems: ? No headache, visual changes, nausea, vomiting, diarrhea, constipation, dizziness, abdominal pain, skin rash, fevers, chills, night sweats, weight loss, swollen lymph nodes, body aches, joint swelling, chest pain, shortness of breath, mood changes. POSITIVE muscle aches ? ?Objective  ?Blood pressure (!) 150/100, pulse 81, height 5\' 4"  (1.626 m), weight 133 lb (60.3 kg), SpO2 98 %. ?  ?General: No apparent distress alert and oriented x3 mood and affect normal, dressed appropriately.   ? ?After informed written and verbal consent, patient was seated on exam table. Left knee was prepped with alcohol swab and utilizing anterolateral approach, patient's left knee space was injected with 2 cc of 0.5% Marcaine and then injected 3 cc of PRP. Patient tolerated the procedure well without immediate complications. ? ? ?Procedure: Real-time Ultrasound Guided Injection of left sacroiliac joint ?Device: GE Logiq Q7 ?Ultrasound guided injection is preferred based studies that show increased duration, increased effect, greater accuracy, decreased procedural pain, increased response rate, and decreased cost with ultrasound guided versus blind injection.  ?  Verbal informed consent obtained.  ?Time-out conducted.  ?Noted no overlying erythema, induration, or other signs of local infection.  ?Skin prepped in a sterile fashion.  ?Local anesthesia: Topical Ethyl chloride.  ?With sterile technique and under real time ultrasound guidance: With a 21-gauge 2 inch needle injecting 0.5 cc of 0.5% Marcaine and then injected with 2 cc of PRP leukocyte poor ?Completed without difficulty  ?Advised to call if fevers/chills, erythema, induration, drainage, or persistent bleeding.  ?Impression: Technically successful ultrasound guided injection. ?  ?Impression and Recommendations:  ?  ?The above documentation has been reviewed and is accurate and complete [name], DO ? ? ? ?

## 2021-12-15 ENCOUNTER — Encounter: Payer: Self-pay | Admitting: Family Medicine

## 2021-12-15 ENCOUNTER — Ambulatory Visit (INDEPENDENT_AMBULATORY_CARE_PROVIDER_SITE_OTHER): Payer: Self-pay | Admitting: Family Medicine

## 2021-12-15 ENCOUNTER — Other Ambulatory Visit: Payer: Self-pay

## 2021-12-15 ENCOUNTER — Ambulatory Visit: Payer: Self-pay

## 2021-12-15 VITALS — BP 150/100 | HR 81 | Ht 64.0 in | Wt 133.0 lb

## 2021-12-15 DIAGNOSIS — M533 Sacrococcygeal disorders, not elsewhere classified: Secondary | ICD-10-CM

## 2021-12-15 DIAGNOSIS — M25562 Pain in left knee: Secondary | ICD-10-CM | POA: Insufficient documentation

## 2021-12-15 DIAGNOSIS — M25552 Pain in left hip: Secondary | ICD-10-CM

## 2021-12-15 DIAGNOSIS — G8929 Other chronic pain: Secondary | ICD-10-CM

## 2021-12-15 NOTE — Patient Instructions (Signed)
No ice or IBU for 3 days ?See me again in 4-6 weeks ?

## 2021-12-15 NOTE — Assessment & Plan Note (Signed)
Patient with extra PRP and we injected into the left knee for the postsurgical changes after patient did have the ganglion cyst removed.  We will see if this does make a difference in the area.  Has had popliteal tendinitis that could also be contributing.  Follow-up again in 6 weeks. ?

## 2021-12-15 NOTE — Assessment & Plan Note (Signed)
Injection given today, tolerated the procedure well, we will see how patient responds.  Follow-up again in 6 to 8 weeks. ?

## 2021-12-21 ENCOUNTER — Encounter: Payer: Medicare Other | Admitting: Neurology

## 2021-12-24 ENCOUNTER — Other Ambulatory Visit: Payer: Self-pay

## 2021-12-24 ENCOUNTER — Ambulatory Visit (INDEPENDENT_AMBULATORY_CARE_PROVIDER_SITE_OTHER): Payer: Medicare Other | Admitting: Internal Medicine

## 2021-12-24 ENCOUNTER — Encounter: Payer: Self-pay | Admitting: Internal Medicine

## 2021-12-24 ENCOUNTER — Encounter: Payer: Self-pay | Admitting: Family Medicine

## 2021-12-24 VITALS — BP 128/82 | HR 97 | Ht 64.0 in | Wt 133.0 lb

## 2021-12-24 DIAGNOSIS — E210 Primary hyperparathyroidism: Secondary | ICD-10-CM

## 2021-12-24 DIAGNOSIS — E039 Hypothyroidism, unspecified: Secondary | ICD-10-CM | POA: Diagnosis not present

## 2021-12-24 DIAGNOSIS — M810 Age-related osteoporosis without current pathological fracture: Secondary | ICD-10-CM | POA: Diagnosis not present

## 2021-12-24 DIAGNOSIS — E042 Nontoxic multinodular goiter: Secondary | ICD-10-CM

## 2021-12-24 LAB — T4, FREE: Free T4: 1.1 ng/dL (ref 0.60–1.60)

## 2021-12-24 LAB — VITAMIN D 25 HYDROXY (VIT D DEFICIENCY, FRACTURES): VITD: 46.27 ng/mL (ref 30.00–100.00)

## 2021-12-24 LAB — TSH: TSH: 1.27 u[IU]/mL (ref 0.35–5.50)

## 2021-12-24 NOTE — Patient Instructions (Signed)
Please stop at the lab. ? ?Please continue vitamin D 2000 units daily. ? ?Please continue levothyroxine 88 mcg daily. ? ?Take the thyroid hormone every day, with water, at least 30 minutes before breakfast, separated by at least 4 hours from: ?- acid reflux medications ?- calcium ?- iron ?- multivitamins ? ?Please come back for a follow-up appointment in 1 year. ? ?

## 2021-12-24 NOTE — Progress Notes (Signed)
Patient ID: Shannon [name], female   DOB: 01-17-1951, 71 y.o.   MRN: [id number]  ? ?This visit occurred during the SARS-CoV-2 public health emergency.  Safety protocols were in place, including screening questions prior to the visit, additional usage of staff PPE, and extensive cleaning of exam room while observing appropriate contact time as indicated for disinfecting solutions.  ? ?HPI  ?LORENE KLIMAS is a 71 y.o.-year-old female, initially referred by her PCP, Dr. Redmond School, returning for f/u for H/o Primary hyperparathyroidism, OP, multi-nodular thyroid. Last visit 1 year ago. ? ?Interim history: ?Since last visit, she has been seeing Dr. Phillis Knack for back pain.  Previously on epidural steroid injections, which did not help.  She just got an injection with platelet rich plasma, which helped significantly.she is in physical therapy. ?She was not very active >> gained weight.  ?She continues to have good energy.  ?No fractures or falls since last visit.  No disequilibrium/orthostasis/vertigo/poor vision. ? ?Reviewed and addended hx:  ?Pt has had hypercalcemia at least since 2012. ? ?I have reviewed pertinent labs:  ?Lab Results  ?Component Value Date  ? PTH 33 06/05/2017  ? PTH Comment 06/05/2017  ? PTH 118 (H) 02/03/2017  ? PTH Comment 02/03/2017  ? PTH 127 (H) 10/07/2016  ? PTH 169 (H) 09/07/2016  ? CALCIUM 9.7 11/26/2020  ? CALCIUM 9.8 08/13/2019  ? CALCIUM 9.5 03/05/2019  ? CALCIUM 9.5 05/31/2018  ? CALCIUM 10.0 06/05/2017  ? CALCIUM 8.8 (L) 04/18/2017  ? CALCIUM 10.8 (H) 04/11/2017  ? CALCIUM 11.1 (H) 02/03/2017  ? CALCIUM 11.8 (H) 10/07/2016  ? CALCIUM 12.1 (H) 08/24/2016  ?08/13/2019: Calcium 9.8 ?02/01/2018: Calcium 9.4, PTH 41 -per checked by Dr. Harlow Asa ? ?Further labs confirm primary hyperparathyroidism: ? Calcium ?    8.6 - 10.4 mg/dL 11.8 (H)  ?GFR, Est Non African American ?    >=60 mL/min >89  ?Vitamin D 1, 25 (OH) Total ?    18 - 72 pg/mL 117 (H)  ?Vitamin D3 1, 25 (OH) ?    pg/mL 117  ?Vitamin D2 1, 25  (OH) ?    pg/mL <8  ?PTH ?    15 - 65 pg/mL 127 (H)  ?Magnesium ?    1.5 - 2.5 mg/dL 2.0  ?Phosphorus ?    2.3 - 4.6 mg/dL 2.3  ? ?Urine calcium was high: ?Component ?    Latest Ref Rng & Units 10/11/2016  ?Calcium, Ur ?    Not estab mg/dL 17  ?Calcium, 24 hour urine ?    35 - 250 mg/24 h 476 (H)  ?Creatinine, Urine ?    20 - 320 mg/dL 44  ?Creatinine, 24H Ur ?    0.63 - 2.50 g/24 h 1.23  ? ?11/22/2016: Technetium sestamibi scan: ?1. No evidence of ectopic parathyroid adenoma. ?2. Asymmetric nodular activity in the left peritracheal region on ?the delayed images could reflect asymmetric thyroid activity or a ?left-sided parathyroid adenoma. ? ?11/30/2016: Neck ultrasound: ?There are 2 thyroid nodules as described. ?- The left mid thyroid nodule measures 2.7 x 2.2 x 1.2 cm solid/almost completely solid (2), isoechoic (1), and meets fine-needle aspiration criteria.  ?- The other nodule measures 0.9 cm x 0.7 x 0.6 cm,  solid/almost completely solid (2), hyperechoic (1), taller-than-wide (3), but not meeting criteria for biopsy. ?- There are no obvious masses external to the thyroid gland to suggest parathyroid adenoma. ? ?12/09/2016: 4D CT: ?1. 13 mm mass posterior and inferior to the right thyroid lobe  suspicious for parathyroid adenoma. ?2. Additional subcentimeter soft tissue nodules more inferiorly in the right neck, indeterminate. These may reflect small lymph nodes, however an additional parathyroid adenoma is not excluded (particularly the most inferior nodule just above the thoracic ?inlet). ?3. 2.2 cm left thyroid nodule as described on ultrasound. ? ?12/14/2016: Thyroid nodule FNA: benign ? ?She had parathyroidectomy by Dr. Harlow Asa - 01/02/2017.  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 ? ?She initially felt well after the surgery, but then started to be fatigued again.  Calcium remains high after the surgery: 11.9 (01/19/2017), while PTH decreased to 76 at follow-up with Dr. Harlow Asa 2 weeks postop.  However, it then  increased to 118. ? 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 ? ?Her calcium was 8.8 first day postop, then 04/27/2017: Ca 9.2, PTH was 61.She feels great after the surgery. ? ?She feels great after the surgery.  Continues to exercise-running. ? ?Review thyroid ultrasounds reports: ?Thyroid U/S (02/12/2018):  ?Left mid nodule 2 measures 2.4 x 2.3 x 1.4 cm and previously measured 2.7 x 2.2 x 1.2 cm. Biopsy was performed 12/14/2016. ?Right lobe nodule measures 0.8 cm and previously measured 0.9 cm. It does not meet criteria for biopsy nor follow-up. ?IMPRESSION: ?Left nodule 2 is stable and previously underwent biopsy on 12/14/2016. ? ?Thyroid ultrasound (01/05/2021): ?Parenchymal Echotexture: Moderately heterogeneous ?Isthmus: 0.2 cm ?Right lobe: 3.5 x 1.7 x 1.6 cm ?Left lobe: 3.5 x 2.0 x 1.8 cm ?______________________________________________________ ?  ?Nodule 1: 0.6 cm isoechoic solid nodule in the mid thyroid lobe is ?not significantly changed in size since prior exam and does not meet ?criteria for FNA or imaging follow-up. ?_________________________________________________________ ?  ?Nodule 2: 2.3 x 2.0 x 1.3 cm solid isoechoic nodule located in the ?mid left thyroid lobe is not significantly changed in size compared ?to prior examination where it measured 2.4 x 2.3 x 1.4 cm. Prior FNA ?of this nodule was performed on 12/14/2016. Please correlate with ?results. ?  ?IMPRESSION: ?Previously biopsied left mid thyroid nodule measuring 2.3 x 2.0 x ?1.3 cm is not significantly changed in size. ? ?Hypothyroidism: ? ?Pt is on levothyroxine 88 mcg daily, taken: ?- in am (1-2 am) ?- fasting ?- at least 2h from b'fast ?- no Ca, Fe, MVI, PPIs ?- not on Biotin ?On B12, D3, CoQ10, Mg. ? ?Reviewed her TFTs: ?Lab Results  ?Component Value Date  ? TSH 2.760 11/26/2020  ? TSH 1.69 12/24/2019  ? TSH 2.270 08/13/2019   ? TSH 6.30 (H) 06/06/2019  ? TSH 9.88 (H) 04/11/2019  ? TSH 16.35 (H) 03/05/2019  ? TSH 6.57 (H) 03/02/2018  ?? 01/2018: TSH 8.98 ?Lab Results  ?Component Value Date  ? FREET4 1.17 12/24/2019  ? FREET4 0.97 06/06/2019  ? FREET4 0.66 04/11/2019  ? FREET4 0.57 (L) 03/05/2019  ? FREET4 0.68 03/02/2018  ? ?Lab Results  ?Component Value Date  ? T3FREE 3.0 03/05/2019  ? T3FREE 3.2 03/02/2018  ? ?Osteoporosis: ? ?Reviewed available DXA scan reports: ?Date L1-L4 T score FN T score  ?12/09/2020 [facility] imaging) -3.0 RFN n/a ?LFN -3.0  ? ?Date L1-L4 T score FN T score  ?05/27/2016 [facility]) -3.6 RFN -2.9 ?LFN -3.2  ? ?Date L1-L4 T score FN T score  ?05/17/2012  ?([facility]) -3.1  RFN -2.7 ?LFN -3.0   ? ?She refused antiresorptive medicine. ? ?She has a history of left thumb fracture in 2001 while playing  tennis. She had several falls playing sports or missing a step >> no fxs. ? ?No history of kidney stones. ? ?No CKD. Last BUN/Cr: ?Lab Results  ?Component Value Date  ? BUN 10 11/26/2020  ? BUN 7 (L) 08/13/2019  ? CREATININE 0.63 11/26/2020  ? CREATININE 0.65 08/13/2019  ? ?No history of vitamin D deficiency: ?Lab Results  ?Component Value Date  ? VD25OH 40.9 12/24/2020  ? VD25OH 45.55 12/24/2019  ? VD25OH 46.31 03/05/2019  ? VD25OH 41.58 03/02/2018  ? VD25OH 32.31 02/03/2017  ? VD25OH 35 08/24/2016  ? VD25OH 31 06/04/2012  ? ?She continues on 2000 units vitamin D daily.  She is not eating dairy.  Her son is allergic to dairy. ? ?Pt does not have a FH of hypercalcemia, pituitary tumors, thyroid cancer.  ? ?She continues intermittent fasting and is a vegetarian. ? ?ROS: ?+ see HPI ?+ joint aches ? ?I reviewed pt's medications, allergies, PMH, social hx, family hx, and changes were documented in the history of present illness. Otherwise, unchanged from my initial visit note. ? ?Past Medical History:  ?Diagnosis Date  ? Allergy   ? RHINTIS  ? Asthma   ? Fibrocystic disease of breast   ? Hemorrhoids   ?  Hx of colonic polyps   ? Menopause   ? Mitral valve prolapse   ? Mitral valve prolapse   ? PONV (postoperative nausea and vomiting)   ? Pt asked to be careful with her teeth, due to them being brittle from low

## 2021-12-26 LAB — CALCIUM, IONIZED: Calcium, Ion: 4.7 mg/dL (ref 4.7–5.5)

## 2022-01-06 ENCOUNTER — Telehealth: Payer: Self-pay | Admitting: Family Medicine

## 2022-01-06 NOTE — Telephone Encounter (Signed)
Left message for patient to call back and schedule Medicare Annual Wellness Visit (AWV) either virtually or in office. ?I left my number for patient to call [phone]. ? ?Last AWV ;11/26/20 ? please schedule at anytime with health coach ? ?I wanted to schedule AWV with [facility] prior to  appointment with pcp ?

## 2022-01-11 NOTE — Progress Notes (Signed)
?Shannon [name] D.O. ?[facility] ?[place] ?Phone: [phone] ?Subjective:   ?I, Shannon [name], am serving as a scribe for Dr. Hulan [name]. ? ?This visit occurred during the SARS-CoV-2 public health emergency.  Safety protocols were in place, including screening questions prior to the visit, additional usage of staff PPE, and extensive cleaning of exam room while observing appropriate contact time as indicated for disinfecting solutions.  ? ? ?I'm seeing this patient by the request  of:  [name], MD ? ?CC: Back pain, knee pain, shoulder pain follow-up with ? ?[id number]  ?12/15/2021 ?Patient with extra PRP and we injected into the left knee for the postsurgical changes after patient did have the ganglion cyst removed.  We will see if this does make a difference in the area.  Has had popliteal tendinitis that could also be contributing.  Follow-up again in 6 weeks ? ?Injection given today, tolerated the procedure well, we will see how patient responds.  Follow-up again in 6 to 8 weeks. ? ?Update 01/12/2022 ?Shannon [name] is a 71 y.o. female coming in with complaint of L hip and L knee pain. Patient states that she is doing well. Pain in L knee is gone. Has been trying to walk for 30 minutes at a time. Pain in L SI joint is not completely gone. Having 1/10 pain today that feels like pressure that causes some numbness in L knee. Has had days with little to no numbness.  ? ?Patient also complains of numbness in hands with driving and chopping vegetables. Pain will subside once she stops doing these activities. Does have popping in neck at times but no pain.  ? ? ?  ? ?Past Medical History:  ?Diagnosis Date  ? Allergy   ? RHINTIS  ? Asthma   ? Fibrocystic disease of breast   ? Hemorrhoids   ? Hx of colonic polyps   ? Menopause   ? Mitral valve prolapse   ? Mitral valve prolapse   ? PONV (postoperative nausea and vomiting)   ? Pt asked to be careful with her teeth, due to  them being brittle from low calcium  ? Primary hyperparathyroidism ([place])   ? right  ? Spinal headache   ? ?Past Surgical History:  ?Procedure Laterality Date  ? APPENDECTOMY  1964  ? COLONOSCOPY    ? CYSTOSTOMY W/ BLADDER BIOPSY    ? Dr. Rosana Hoes  ? HEMORRHOID SURGERY    ? Dr. Truitt Leep  ? NECK EXPLORATION  04/17/2017  ? with parathyroidectomy  ? PARATHYROIDECTOMY Right 01/02/2017  ? Procedure: RIGHT INFERIOR PARATHYROIDECTOMY;  Surgeon: Armandina Gemma, MD;  Location: [facility];  Service: General;  Laterality: Right;  ? PARATHYROIDECTOMY N/A 04/17/2017  ? Procedure: NECK EXPLORATION WITH PARATHYROIDECTOMY;  Surgeon: Armandina Gemma, MD;  Location: [place];  Service: General;  Laterality: N/A;  ? [place]  ? Dr Maxie Better  ? TONSILLECTOMY AND ADENOIDECTOMY    ? tummy tuck    ? ?Social History  ? ?Socioeconomic History  ? Marital status: Married  ?  Spouse name: Not on file  ? Number of children: 2  ? Years of education: Not on file  ? Highest education level: Not on file  ?Occupational History  ? Occupation: retired  ?Tobacco Use  ? Smoking status: Former  ?  Packs/day: 0.25  ?  Years: 10.00  ?  Pack years: 2.50  ?  Types: Cigarettes  ?  Quit date: 06/04/1985  ?  Years since quitting: 36.6  ? Smokeless tobacco: Never  ?Vaping Use  ? Vaping Use: Never used  ?Substance and Sexual Activity  ? Alcohol use: Yes  ?  Alcohol/week: 5.0 standard drinks  ?  Types: 5 Glasses of wine per week  ? Drug use: No  ? Sexual activity: Yes  ?Other Topics Concern  ? Not on file  ?Social History Narrative  ? Not on file  ? ?Social Determinants of Health  ? ?Financial Resource Strain: Not on file  ?Food Insecurity: Not on file  ?Transportation Needs: Not on file  ?Physical Activity: Not on file  ?Stress: Not on file  ?Social Connections: Not on file  ? ?Allergies  ?Allergen Reactions  ? Asa [Aspirin] Other (See Comments)  ?  Minor bleeding  ? ?Family History  ?Problem Relation Age of Onset  ? Cancer Father 3  ?     Lung cancer  ? Heart disease Father    ? Hyperlipidemia Father   ? Heart disease Mother 65  ?     Sudden death presumed MI  ? Heart disease Brother 53  ?     Presumed CHF  ? Heart disease Brother   ? ? ?Current Outpatient Medications (Endocrine & Metabolic):  ?  levothyroxine (SYNTHROID) 88 MCG tablet, TAKE 1 TABLET(88 MCG) BY MOUTH DAILY BEFORE BREAKFAST ? ? ?Current Outpatient Medications (Respiratory):  ?  albuterol (VENTOLIN HFA) 108 (90 Base) MCG/ACT inhaler, INHALE 2 PUFFS INTO THE LUNGS EVERY 6 HOURS AS NEEDED FOR WHEEZING OR SHORTNESS OF BREATH ? ?Current Outpatient Medications (Analgesics):  ?  HYDROcodone-acetaminophen (NORCO) 5-325 MG tablet, Take 1-2 tablets by mouth 3 (three) times daily as needed. To be taken after surgery ? ?Current Outpatient Medications (Hematological):  ?  vitamin B-12 (CYANOCOBALAMIN) 1000 MCG tablet, Take 1,000 mcg by mouth daily. ? ?Current Outpatient Medications (Other):  ?  Cholecalciferol 50 MCG (2000 UT) CAPS, Take 2,000 Units by mouth daily. ?  Coenzyme Q10 (CO Q-10) 100 MG CAPS, Take 100 mg by mouth daily. ?  Magnesium 250 MG TABS, Take 250 mg by mouth daily.  ?  ondansetron (ZOFRAN) 4 MG tablet, Take 1 tablet (4 mg total) by mouth every 8 (eight) hours as needed for nausea or vomiting. ? ? ?Reviewed prior external information including notes and imaging from  ?primary care provider ?As well as notes that were available from care everywhere and other healthcare systems. ? ?Past medical history, social, surgical and family history all reviewed in electronic medical record.  No pertanent information unless stated regarding to the chief complaint.  ? ?Review of Systems: ? No headache, visual changes, nausea, vomiting, diarrhea, constipation, dizziness, abdominal pain, skin rash, fevers, chills, night sweats, weight loss, swollen lymph nodes, body aches, joint swelling, chest pain, shortness of breath, mood changes. POSITIVE muscle aches ? ?Objective  ?Blood pressure 116/80, pulse 81, height '5\' 4"'$  (1.626 m),  weight 131 lb (59.4 kg), SpO2 98 %. ?  ?General: No apparent distress alert and oriented x3 mood and affect normal, dressed appropriately.  ?HEENT: Pupils equal, extraocular movements intact  ?Respiratory: Patient's speak in full sentences and does not appear short of breath  ?Cardiovascular: No lower extremity edema, non tender, no erythema  ?Gait normal with good balance and coordination.  ?MSK: Low back in the sacroiliac joint significantly improved from previous exam.  Mild tightness with Corky Sox on the left compared to the right but significant improvement noted. ? ?Right wrist on exam does have what appears to  be a small cyst noted on the palmar aspect near the radial aspect nontender ? ?Left wrist has a very mild positive Tinel's.  Good grip strength noted.  No thenar eminence wasting ? ?Limited muscular skeletal ultrasound was performed and interpreted by Shannon [name], M  ?Limited ultrasound of patient's right wrist in the area of fluctuation shows that patient does have a cyst but is close to the radial artery.  Patient also has what appears to be invading the circumvents the cyst. ?Right wrist shows the patient does have some enlargement of the area of the median nerve at 0.17 cm? which is consistent with elevation in ?Impression: Likely ganglion cyst but had complication with close proximity of vascularity ?Right wrist shows mild to moderate carpal tunnel ?  ?Impression and Recommendations:  ?  ? ?The above documentation has been reviewed and is accurate and complete [name], DO ? ? ? ?

## 2022-01-12 ENCOUNTER — Ambulatory Visit (INDEPENDENT_AMBULATORY_CARE_PROVIDER_SITE_OTHER): Payer: Medicare Other | Admitting: Family Medicine

## 2022-01-12 ENCOUNTER — Ambulatory Visit (INDEPENDENT_AMBULATORY_CARE_PROVIDER_SITE_OTHER): Payer: Medicare Other

## 2022-01-12 ENCOUNTER — Ambulatory Visit: Payer: Self-pay

## 2022-01-12 ENCOUNTER — Encounter: Payer: Self-pay | Admitting: Family Medicine

## 2022-01-12 VITALS — BP 116/80 | HR 81 | Ht 64.0 in | Wt 131.0 lb

## 2022-01-12 DIAGNOSIS — M47812 Spondylosis without myelopathy or radiculopathy, cervical region: Secondary | ICD-10-CM | POA: Diagnosis not present

## 2022-01-12 DIAGNOSIS — M542 Cervicalgia: Secondary | ICD-10-CM | POA: Diagnosis not present

## 2022-01-12 DIAGNOSIS — M25552 Pain in left hip: Secondary | ICD-10-CM

## 2022-01-12 DIAGNOSIS — M67431 Ganglion, right wrist: Secondary | ICD-10-CM | POA: Insufficient documentation

## 2022-01-12 DIAGNOSIS — G5602 Carpal tunnel syndrome, left upper limb: Secondary | ICD-10-CM | POA: Insufficient documentation

## 2022-01-12 DIAGNOSIS — M4802 Spinal stenosis, cervical region: Secondary | ICD-10-CM | POA: Diagnosis not present

## 2022-01-12 NOTE — Patient Instructions (Signed)
L wrist brace day and night for 2 weeks ?See me in 2 months ?

## 2022-01-12 NOTE — Assessment & Plan Note (Signed)
Discussed with patient we should monitor closely.  Worsening pain we will need to consider the possibility of aspiration but secondary to the proximity of the vascularity this could be quite complicated.  We discussed that unfortunately also surgery could also be difficult as well.  Patient will continue to monitor.  Follow-up with me again in 6 to 8 weeks otherwise for this problem or as needed. ?

## 2022-01-12 NOTE — Assessment & Plan Note (Signed)
We discussed the anatomy involved, and that carpal tunnel syndrome primarily involves the median nerve, and this typically affects digits one through 3.  ? ?We also discussed that mild cases of carpal tunnel syndrome are often improved with night splints.  If symptoms persist it is very reasonable to consider a carpal tunnel injection.  ? ?If the patient does have moderate to severe carpal tunnel syndrome based on NCV, then it is certainly reasonable to consider surgical consultation for definitive management possible carpal tunnel release.  We also discussed his severe carpal tunnel syndrome can lead to permanent nerve impairment even if released. At this point, the patient like to proceed conservatively. ?Patient will follow-up again in 6 to 8 weeks. ?

## 2022-01-26 ENCOUNTER — Ambulatory Visit (INDEPENDENT_AMBULATORY_CARE_PROVIDER_SITE_OTHER): Payer: Medicare Other | Admitting: Family Medicine

## 2022-01-26 ENCOUNTER — Encounter: Payer: Self-pay | Admitting: Family Medicine

## 2022-01-26 VITALS — BP 128/80 | HR 80 | Temp 97.7°F | Ht 64.5 in | Wt 127.4 lb

## 2022-01-26 DIAGNOSIS — Z9849 Cataract extraction status, unspecified eye: Secondary | ICD-10-CM

## 2022-01-26 DIAGNOSIS — D1721 Benign lipomatous neoplasm of skin and subcutaneous tissue of right arm: Secondary | ICD-10-CM | POA: Diagnosis not present

## 2022-01-26 DIAGNOSIS — E039 Hypothyroidism, unspecified: Secondary | ICD-10-CM

## 2022-01-26 DIAGNOSIS — B182 Chronic viral hepatitis C: Secondary | ICD-10-CM | POA: Diagnosis not present

## 2022-01-26 DIAGNOSIS — M810 Age-related osteoporosis without current pathological fracture: Secondary | ICD-10-CM | POA: Diagnosis not present

## 2022-01-26 DIAGNOSIS — Z13220 Encounter for screening for lipoid disorders: Secondary | ICD-10-CM

## 2022-01-26 DIAGNOSIS — E042 Nontoxic multinodular goiter: Secondary | ICD-10-CM

## 2022-01-26 DIAGNOSIS — J4520 Mild intermittent asthma, uncomplicated: Secondary | ICD-10-CM | POA: Diagnosis not present

## 2022-01-26 DIAGNOSIS — E892 Postprocedural hypoparathyroidism: Secondary | ICD-10-CM | POA: Diagnosis not present

## 2022-01-26 DIAGNOSIS — Z136 Encounter for screening for cardiovascular disorders: Secondary | ICD-10-CM | POA: Diagnosis not present

## 2022-01-26 DIAGNOSIS — Z1211 Encounter for screening for malignant neoplasm of colon: Secondary | ICD-10-CM

## 2022-01-26 NOTE — Progress Notes (Signed)
Shannon [name] is a 71 y.o. female who presents for annual wellness visit and follow-up on chronic medical conditions.  She has had continued difficulty with her back.  And is getting good results from Dr. Tamala Julian for control of this.  She does have a history of osteoporosis but is not on any medications for this citing her own concerns over possible side effects of any of the medications.  Her most recent DEXA scan did show an improvement from her previous 1.  She also has a lipoma present on her right forearm that she would like removed.  She states it is now starting to cause some mechanical discomfort.  She has had cataract surgery and has had difficulty with some drainage from the left eye but it seems to be mainly clear.  She has been using topical medications without much success.  She has been seen by endocrinology for her thyroid condition she does have evidence of multiple thyroid nodules.  She has had a parathyroidectomy.  Review of the record indicates positive hepatitis C however her viral load is undetectable.  She rarely has difficulty from her asthma. ? ?Immunizations and Health Maintenance ?Immunization History  ?Administered Date(s) Administered  ? DTaP 04/17/1998  ? Fluad Quad(high Dose 65+) 06/06/2019  ? Influenza Split 10/06/2011, 07/21/2015  ? Influenza, High Dose Seasonal PF 07/01/2020  ? Influenza,inj,Quad PF,6+ Mos 06/05/2017, 06/26/2018  ? Influenza-Unspecified 06/27/2021  ? PFIZER(Purple Top)SARS-COV-2 Vaccination 10/30/2019, 11/18/2019, 07/04/2020  ? PPD Test 07/21/1982  ? Pneumococcal Conjugate-13 06/20/2019, 06/26/2020  ? Td 12/08/2019  ? Tdap 06/30/2009  ? Zoster Recombinat (Shingrix) 01/08/2020  ? Zoster, Live 06/04/2012  ? ?Health Maintenance Due  ?Topic Date Due  ? Zoster Vaccines- Shingrix (2 of 2) 03/04/2020  ? COVID-19 Vaccine (4 - Booster for Pfizer series) 08/29/2020  ? Fecal DNA (Cologuard)  10/16/2021  ? ? ?Last Pap smear: aged out  ?Last mammogram:  03/19/20 ?Last colonoscopy:  07/23/2008 cologuard  10/16/2018 ?Last DEXA: 12/09/20  ?Dentist: Q six months  ?Ophtho: Q year ?Exercise: QD for one hour  ? ?Other doctors caring for patient include: Dr. Erlinda Hong orthopedic  ?           Dr. Tamala Julian  sport med  ?           Dr. Cruzita Lederer endo ?            Dr. Royston Sinner OB/GYN ?Advanced directives: ?Does Patient Have a Medical Advance Directive?: Yes ?Type of Advance Directive: Living will, Healthcare Power of Attorney ?Copy of [place] in Chart?: No - copy requested ? ?Depression screen:  See questionnaire below.  ? ?  01/26/2022  ?  8:56 AM 01/15/2021  ? 10:35 AM 11/26/2020  ?  9:48 AM 03/30/2020  ?  8:53 AM 08/13/2019  ?  1:46 PM  ?Depression screen PHQ 2/9  ?Decreased Interest 0 0 0 0 0  ?Down, Depressed, Hopeless 0 0 0 0 0  ?PHQ - 2 Score 0 0 0 0 0  ? ? ?Fall Risk Screen: see questionnaire below. ? ?  01/26/2022  ?  8:56 AM 01/15/2021  ? 10:35 AM 11/26/2020  ?  9:47 AM 08/13/2019  ?  1:45 PM 05/31/2018  ? 10:58 AM  ?Fall Risk   ?Falls in the past year? 0 0 0 0 No  ?Number falls in past yr: 0 0 0    ?Injury with Fall? 0 0 0    ?Risk for fall due to : No Fall Risks  No Fall Risks No Fall Risks    ?Follow up Falls evaluation completed Falls evaluation completed Falls evaluation completed    ? ? ?ADL screen:  See questionnaire below ?Functional Status Survey: ?Is the patient deaf or have difficulty hearing?: No ?Does the patient have difficulty seeing, even when wearing glasses/contacts?: No ?Does the patient have difficulty concentrating, remembering, or making decisions?: No ?Does the patient have difficulty walking or climbing stairs?: No ?Does the patient have difficulty dressing or bathing?: No ?Does the patient have difficulty doing errands alone such as visiting a doctor's office or shopping?: No ? ? ?Review of Systems ?Constitutional: -, -unexpected weight change, -anorexia, -fatigue ?Allergy: -sneezing, -itching, -congestion ?Dermatology: denies changing moles, rash, lumps ?ENT: -runny nose, -ear  pain, -sore throat,  ?Cardiology:  -chest pain, -palpitations, -orthopnea, ?Respiratory: -cough, -shortness of breath, -dyspnea on exertion, -wheezing,  ?Gastroenterology: -abdominal pain, -nausea, -vomiting, -diarrhea, -constipation, -dysphagia ?Hematology: -bleeding or bruising problems ?Musculoskeletal: -arthralgias, -myalgias, -joint swelling, -back pain, - ?Ophthalmology: -vision changes,  ?Urology: -dysuria, -difficulty urinating,  -urinary frequency, -urgency, incontinence ?Neurology: -, -numbness, , -memory loss, -falls, -dizziness ? ? ? ?PHYSICAL EXAM: ?General Appearance: Alert, cooperative, no distress, appears stated age ?Head: Normocephalic, without obvious abnormality, atraumatic ?Eyes: PERRL, conjunctiva/corneas clear, EOM's intact, fundi benign ?Ears: Normal TM's and external ear canals ?Nose: Nares normal, mucosa normal, no drainage or sinus tenderness ?Throat: Lips, mucosa, and tongue normal; teeth and gums normal ?Neck: Supple, no lymphadenopathy;  thyroid:  no enlargement/tenderness/nodules; no carotid bruit or JVD ?Lungs: Clear to auscultation bilaterally without wheezes, rales or ronchi; respirations unlabored ?Heart: Regular rate and rhythm, S1 and S2 normal, no murmur, rubor gallop ?Abdomen: Soft, non-tender, nondistended, normoactive bowel sounds,  ?no masses, no hepatosplenomegaly ?Extremities: No clubbing, cyanosis or edema ?Pulses: 2+ and symmetric all extremities ?Skin:  Skin color, texture, turgor normal, no rashes multiple lipomas are noted with a large 1 causing difficulty on the right forearm.   ?Lymph nodes: Cervical, supraclavicular, and axillary nodes normal ?Neurologic:  CNII-XII intact, normal strength, sensation and gait; reflexes 2+ and symmetric throughout ?Psych: Normal mood, affect, hygiene and grooming. ? ?ASSESSMENT/PLAN: ?Osteoporosis without current pathological fracture, unspecified osteoporosis type - Plan: CBC with Differential/Platelet, Comprehensive metabolic  panel ? ?Lipid screening - Plan: Lipid panel ? ?Multiple thyroid nodules ? ?H/O parathyroidectomy ([place]) - Plan: CBC with Differential/Platelet, Comprehensive metabolic panel ? ?Mild intermittent asthma, unspecified whether complicated - Plan: CBC with Differential/Platelet, Comprehensive metabolic panel ? ?Hypothyroidism, unspecified type ? ?Lipoma of right forearm ? ?Screening for colon cancer - Plan: Cologuard ? ?Chronic hepatitis C without hepatic coma (HCC) - Viral load negative ? ?Status post cataract extraction, unspecified laterality ?Recommend she continue with the eyedrops but if continued difficulty I will refer to ophthalmology.  Discussed treatment of her osteoporosis and at this point she is not interested in pursuing appropriate medication therapy.  I tried to convince her to not have the lipoma removed but she will set up an appointment for me to remove it since it is causing her some difficulty. ? ? ?Discussed  healthy diet, including goals of calcium and vitamin D intake  Immunization recommendations discussed.  Colonoscopy recommendations reviewed ? ? ?Medicare Attestation ?I have personally reviewed: ?The patient's medical and social history ?Their use of alcohol, tobacco or illicit drugs ?Their current medications and supplements ?The patient's functional ability including ADLs,fall risks, home safety risks, cognitive, and hearing and visual impairment ?Diet and physical activities ?Evidence for depression or mood disorders ? ?The patient's weight, height,  and BMI have been recorded in the chart.  I have made referrals, counseling, and provided education to the patient based on review of the above and I have provided the patient with a written personalized care plan for preventive services.   ? ? ?Jill Alexanders, MD   01/26/2022   ?

## 2022-01-27 ENCOUNTER — Encounter: Payer: Self-pay | Admitting: Family Medicine

## 2022-01-27 DIAGNOSIS — J4520 Mild intermittent asthma, uncomplicated: Secondary | ICD-10-CM

## 2022-01-27 LAB — COMPREHENSIVE METABOLIC PANEL
ALT: 15 IU/L (ref 0–32)
AST: 24 IU/L (ref 0–40)
Albumin/Globulin Ratio: 1.8 (ref 1.2–2.2)
Albumin: 4.6 g/dL (ref 3.8–4.8)
Alkaline Phosphatase: 68 IU/L (ref 44–121)
BUN/Creatinine Ratio: 14 (ref 12–28)
BUN: 9 mg/dL (ref 8–27)
Bilirubin Total: 0.6 mg/dL (ref 0.0–1.2)
CO2: 25 mmol/L (ref 20–29)
Calcium: 10 mg/dL (ref 8.7–10.3)
Chloride: 100 mmol/L (ref 96–106)
Creatinine, Ser: 0.64 mg/dL (ref 0.57–1.00)
Globulin, Total: 2.5 g/dL (ref 1.5–4.5)
Glucose: 102 mg/dL — ABNORMAL HIGH (ref 70–99)
Potassium: 5.3 mmol/L — ABNORMAL HIGH (ref 3.5–5.2)
Sodium: 140 mmol/L (ref 134–144)
Total Protein: 7.1 g/dL (ref 6.0–8.5)
eGFR: 95 mL/min/{1.73_m2} (ref >59)

## 2022-01-27 LAB — CBC WITH DIFFERENTIAL/PLATELET
Basophils Absolute: 0.1 10*3/uL (ref 0.0–0.2)
Basos: 2 %
EOS (ABSOLUTE): 0.1 10*3/uL (ref 0.0–0.4)
Eos: 2 %
Hematocrit: 43.8 % (ref 34.0–46.6)
Hemoglobin: 14.7 g/dL (ref 11.1–15.9)
Immature Grans (Abs): 0 10*3/uL (ref 0.0–0.1)
Immature Granulocytes: 0 %
Lymphocytes Absolute: 1.5 10*3/uL (ref 0.7–3.1)
Lymphs: 31 %
MCH: 33.3 pg — ABNORMAL HIGH (ref 26.6–33.0)
MCHC: 33.6 g/dL (ref 31.5–35.7)
MCV: 99 fL — ABNORMAL HIGH (ref 79–97)
Monocytes Absolute: 0.4 10*3/uL (ref 0.1–0.9)
Monocytes: 9 %
Neutrophils Absolute: 2.6 10*3/uL (ref 1.4–7.0)
Neutrophils: 56 %
Platelets: 305 10*3/uL (ref 150–450)
RBC: 4.41 x10E6/uL (ref 3.77–5.28)
RDW: 12.6 % (ref 11.7–15.4)
WBC: 4.6 10*3/uL (ref 3.4–10.8)

## 2022-01-27 LAB — LIPID PANEL
Chol/HDL Ratio: 2.3 ratio (ref 0.0–4.4)
Cholesterol, Total: 227 mg/dL — ABNORMAL HIGH (ref 100–199)
HDL: 98 mg/dL (ref >39)
LDL Chol Calc (NIH): 115 mg/dL — ABNORMAL HIGH (ref 0–99)
Triglycerides: 83 mg/dL (ref 0–149)
VLDL Cholesterol Cal: 14 mg/dL (ref 5–40)

## 2022-02-07 ENCOUNTER — Encounter: Payer: Self-pay | Admitting: Family Medicine

## 2022-02-07 ENCOUNTER — Ambulatory Visit (INDEPENDENT_AMBULATORY_CARE_PROVIDER_SITE_OTHER): Payer: Medicare Other | Admitting: Family Medicine

## 2022-02-07 ENCOUNTER — Other Ambulatory Visit: Payer: Self-pay | Admitting: Family Medicine

## 2022-02-07 VITALS — BP 118/72 | HR 90 | Temp 98.2°F | Wt 133.0 lb

## 2022-02-07 DIAGNOSIS — M7989 Other specified soft tissue disorders: Secondary | ICD-10-CM | POA: Diagnosis not present

## 2022-02-07 DIAGNOSIS — L858 Other specified epidermal thickening: Secondary | ICD-10-CM | POA: Diagnosis not present

## 2022-02-07 DIAGNOSIS — D1721 Benign lipomatous neoplasm of skin and subcutaneous tissue of right arm: Secondary | ICD-10-CM

## 2022-02-07 HISTORY — PX: SKIN BIOPSY: SHX1

## 2022-02-07 MED ORDER — LIDOCAINE HCL 1 % IJ SOLN
3.0000 mL | Freq: Once | INTRAMUSCULAR | Status: AC
  Administered 2022-02-07: 3 mL via INTRADERMAL

## 2022-02-07 NOTE — Addendum Note (Signed)
Addended by: Elyse Jarvis on: 02/07/2022 04:56 PM ? ? Modules accepted: Orders ? ?

## 2022-02-07 NOTE — Progress Notes (Signed)
? ?  Subjective:  ? ? Patient ID: Shannon [name], female    DOB: March 11, 1951, 71 y.o.   MRN: [id number] ? ?HPI ?She is here for excision of a lesion on the right forearm near the ulna proximally.  The lesion is easily movable but when she traumatizes it it is very painful. ? ? ?Review of Systems ? ?   ?Objective:  ? Physical Exam ?Round smooth movable lesion is noted in the above-mentioned area. ? ? ? ?   ?Assessment & Plan:  ?Lipoma of right upper extremity ?The skin was injected with Xylocaine and epinephrine.  A 2 cm incision was made.  The lesion was easily extracted from this area.  There was no associated residual tissue.  Adequate hemostasis was obtained and Steri-Strips were applied to the area.  She will return here if she has any trouble. ? ?

## 2022-02-08 DIAGNOSIS — Z1211 Encounter for screening for malignant neoplasm of colon: Secondary | ICD-10-CM | POA: Diagnosis not present

## 2022-02-08 MED ORDER — ALBUTEROL SULFATE HFA 108 (90 BASE) MCG/ACT IN AERS: INHALATION_SPRAY | RESPIRATORY_TRACT | 0 refills | Status: DC

## 2022-02-09 ENCOUNTER — Other Ambulatory Visit: Payer: Self-pay | Admitting: Family Medicine

## 2022-02-09 DIAGNOSIS — J4520 Mild intermittent asthma, uncomplicated: Secondary | ICD-10-CM

## 2022-02-18 ENCOUNTER — Encounter: Payer: Self-pay | Admitting: Family Medicine

## 2022-02-18 LAB — COLOGUARD: COLOGUARD: NEGATIVE

## 2022-03-11 NOTE — Progress Notes (Signed)
Shannon [name] [address] [place] Phone: [phone] Subjective:   Shannon [name], am serving as a scribe for Dr. Hulan Saas.  I'm seeing this patient by the request  of:  Shannon Lung, MD  CC: Left hip and wrist pain  FAO:[id number]  4.5.2023 We discussed the anatomy involved, and that carpal tunnel syndrome primarily involves the median nerve, and this typically affects digits one through 3.    We also discussed that mild cases of carpal tunnel syndrome are often improved with night splints.  If symptoms persist it is very reasonable to consider a carpal tunnel injection.    If the patient does have moderate to severe carpal tunnel syndrome based on NCV, then it is certainly reasonable to consider surgical consultation for definitive management possible carpal tunnel release.  We also discussed his severe carpal tunnel syndrome can lead to permanent nerve impairment even if released. At this point, the patient like to proceed conservatively. Patient will follow-up again in 6 to 8 weeks.  Discussed with patient we should monitor closely.  Worsening pain we will need to consider the possibility of aspiration but secondary to the proximity of the vascularity this could be quite complicated.  We discussed that unfortunately also surgery could also be difficult as well.  Patient will continue to monitor.  Follow-up with me again in 6 to 8 weeks otherwise for this problem or as needed.  Update 03/14/2022 Shannon [name] is a 71 y.o. female coming in with complaint of L hip and L wrist pain. Patient states hip pain is getting better. Starting to do more activities that used to use pain. Numbness in leg is going away. Not at a point where she needs an injection at this time. Numbness in hands is better. No new complaints.       Past Medical History:  Diagnosis Date   Allergy    RHINTIS   Asthma    Fibrocystic disease of breast     Hemorrhoids    Hx of colonic polyps    Menopause    Mitral valve prolapse    Mitral valve prolapse    PONV (postoperative nausea and vomiting)    Pt asked to be careful with her teeth, due to them being brittle from low calcium   Primary hyperparathyroidism ([place])    right   Spinal headache    Past Surgical History:  Procedure Laterality Date   APPENDECTOMY  1964   COLONOSCOPY     CYSTOSTOMY W/ BLADDER BIOPSY     Dr. Rosana Hoes   HEMORRHOID SURGERY     Dr. Truitt Leep   NECK EXPLORATION  04/17/2017   with parathyroidectomy   PARATHYROIDECTOMY Right 01/02/2017   Procedure: RIGHT INFERIOR PARATHYROIDECTOMY;  Surgeon: Armandina Gemma, MD;  Location: [facility];  Service: General;  Laterality: Right;   PARATHYROIDECTOMY N/A 04/17/2017   Procedure: NECK EXPLORATION WITH PARATHYROIDECTOMY;  Surgeon: Armandina Gemma, MD;  Location: [facility];  Service: General;  Laterality: N/A;   SKIN BIOPSY Right 02/07/2022   nodulocystic fat nerosis   THUMB FUSION  1997   Dr Maxie Better   TONSILLECTOMY AND ADENOIDECTOMY     tummy tuck     Social History   Socioeconomic History   Marital status: Married    Spouse name: Not on file   Number of children: 2   Years of education: Not on file   Highest education level: Not on file  Occupational History   Occupation:  retired  Tobacco Use   Smoking status: Former    Packs/day: 0.25    Years: 10.00    Pack years: 2.50    Types: Cigarettes    Quit date: 06/04/1985    Years since quitting: 36.8   Smokeless tobacco: Never  Vaping Use   Vaping Use: Never used  Substance and Sexual Activity   Alcohol use: Yes    Alcohol/week: 5.0 standard drinks    Types: 5 Glasses of wine per week   Drug use: No   Sexual activity: Yes  Other Topics Concern   Not on file  Social History Narrative   Not on file   Social Determinants of Health   Financial Resource Strain: Not on file  Food Insecurity: Not on file  Transportation Needs: Not on file  Physical Activity: Not on file   Stress: Not on file  Social Connections: Not on file   Allergies  Allergen Reactions   Asa [Aspirin] Other (See Comments)    Minor bleeding   Family History  Problem Relation Age of Onset   Cancer Father 43       [name] cancer   Heart disease Father    Hyperlipidemia Father    Heart disease Mother 23       Sudden death presumed MI   Heart disease Brother 59       Presumed CHF   Heart disease Brother     Current Outpatient Medications (Endocrine & Metabolic):    levothyroxine (SYNTHROID) 88 MCG tablet, TAKE 1 TABLET(88 MCG) BY MOUTH DAILY BEFORE BREAKFAST   Current Outpatient Medications (Respiratory):    albuterol (VENTOLIN HFA) 108 (90 Base) MCG/ACT inhaler, INHALE 2 PUFFS INTO THE LUNGS EVERY 6 HOURS AS NEEDED FOR WHEEZING OR SHORTNESS OF BREATH   Current Outpatient Medications (Hematological):    vitamin B-12 (CYANOCOBALAMIN) 1000 MCG tablet, Take 1,000 mcg by mouth daily.  Current Outpatient Medications (Other):    Cholecalciferol 50 MCG (2000 UT) CAPS, Take 2,000 Units by mouth daily.   clindamycin (CLEOCIN) 150 MG capsule, clindamycin HCl 150 mg capsule  TAKE 2 CAPSULES BY MOUTH 1 HOUR PRIOR TO APPOINTMENT AND 1 CAPSULE BY MOUTH 6 HRS AFTER 1ST DOSE (Patient not taking: Reported on 01/26/2022)   Coenzyme Q10 (CO Q-10) 100 MG CAPS, Take 100 mg by mouth daily.   Magnesium 250 MG TABS, Take 250 mg by mouth daily.    Reviewed prior external information including notes and imaging from  primary care provider As well as notes that were available from care everywhere and other healthcare systems.  Past medical history, social, surgical and family history all reviewed in electronic medical record.  No pertanent information unless stated regarding to the chief complaint.   Review of Systems:  No headache, visual changes, nausea, vomiting, diarrhea, constipation, dizziness, abdominal pain, skin rash, fevers, chills, night sweats, weight loss, swollen lymph nodes, body aches,  joint swelling, chest pain, shortness of breath, mood changes. POSITIVE muscle aches  Objective  Blood pressure 130/84, pulse 83, height '5\' 4"'$  (1.626 m), weight 132 lb (59.9 kg), SpO2 96 %.   General: No apparent distress alert and oriented x3 mood and affect normal, dressed appropriately.  HEENT: Pupils equal, extraocular movements intact  Respiratory: Patient's speak in full sentences and does not appear short of breath  Cardiovascular: No lower extremity edema, non tender, no erythema  Gait normal with good balance and coordination.  MSK: Hip exam still shows some mild discomfort over the sacroiliac joint.  Negative straight leg test.  Negative FABER test.  No improvement in range of motion.  5 out of 5 strength noted of the lower extremities bilaterally. Right wrist exam shows good range of motion at the moment.  Negative Tinel's.  Does have some mild tenderness over the median nerve    Impression and Recommendations:     The above documentation has been reviewed and is accurate and complete [name], DO

## 2022-03-14 ENCOUNTER — Ambulatory Visit (INDEPENDENT_AMBULATORY_CARE_PROVIDER_SITE_OTHER): Payer: Medicare Other | Admitting: Family Medicine

## 2022-03-14 DIAGNOSIS — G5702 Lesion of sciatic nerve, left lower limb: Secondary | ICD-10-CM | POA: Diagnosis not present

## 2022-03-14 NOTE — Assessment & Plan Note (Signed)
Doing significantly better at this time.  This in the left sacroiliac joint.  Discussed the possibility of needing the PRP but hopefully will not.  We will make a follow-up in 3 weeks just in case.

## 2022-03-14 NOTE — Patient Instructions (Signed)
Good to see you! Go to town in the garden See you again in 3 weeks just in case See me when you need me

## 2022-03-15 IMAGING — MR MR KNEE*L* WO/W CM
6 of 11 series · 21 of 40 positions shown · IV contrast (multihance)
Comparison: None.

CLINICAL DATA: Painful mass at the lateral aspect of the left knee,
present for several years

EXAM:
MRI OF THE LEFT KNEE WITHOUT AND WITH CONTRAST
TECHNIQUE: Multiplanar, multisequence MR imaging of the left knee was performed
both before and after administration of intravenous contrast.
CONTRAST:  12mL MULTIHANCE GADOBENATE DIMEGLUMINE 529 MG/ML IV SOLN

[Series 4: T2 fat-sat · axial · 4.0mm · 0.50mm/px · z∈[-87,+38]mm · 4 of 26 slices shown (1 of 3)]
[im 1/26]
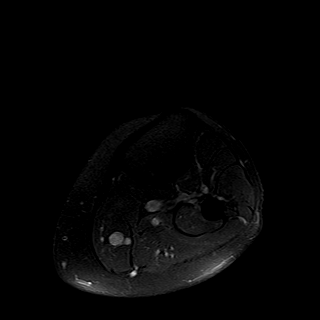
[im 9/26]
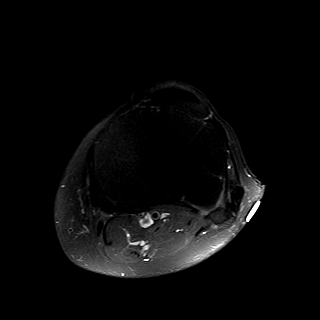
[im 17/26]
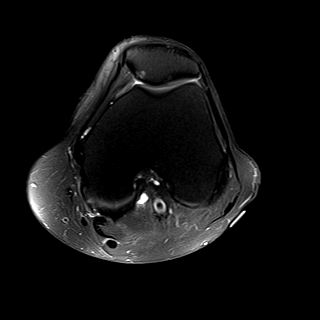
[im 26/26]
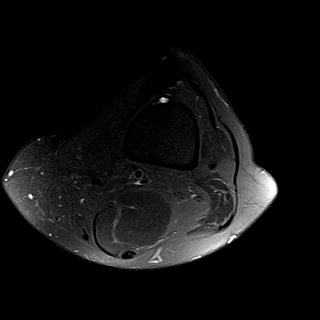

[Series 6: T2 fat-sat · coronal · 4.0mm · 0.29mm/px · 3 of 24 slices shown (2 of 3)]
[im 1/24]
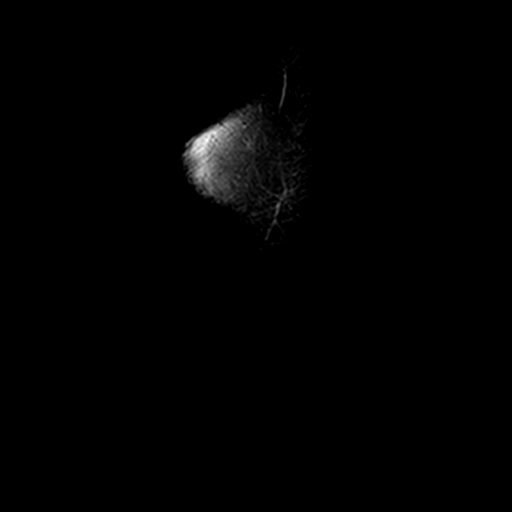
[im 12/24]
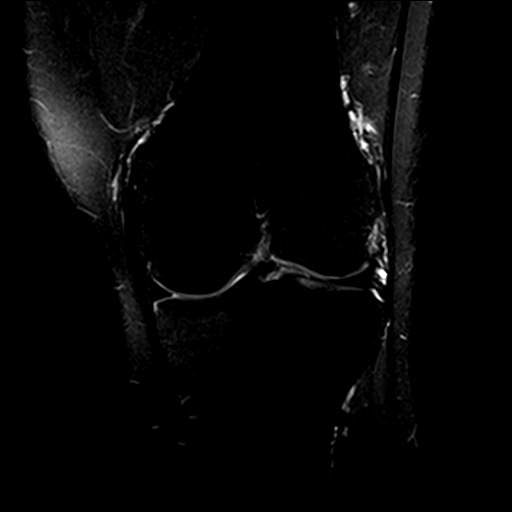
[im 24/24]
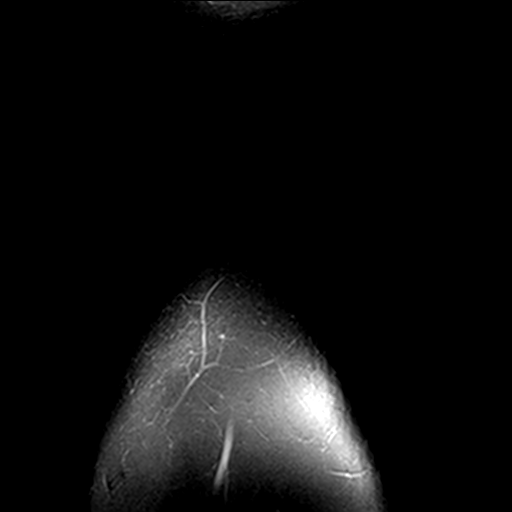

[Series 7: T2 fat-sat · sagittal · 3.0mm · 0.29mm/px · 3 of 28 slices shown (3 of 3)]
[im 1/28]
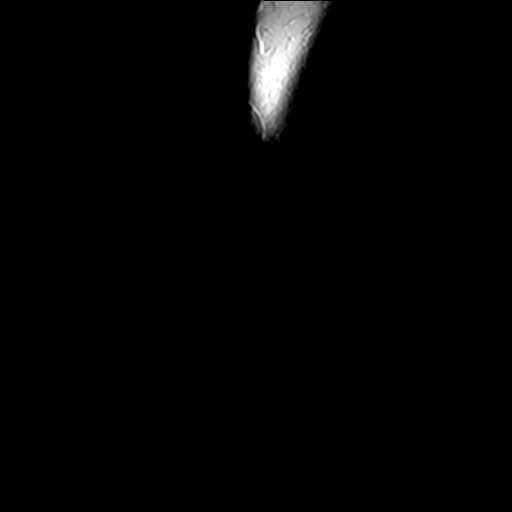
[im 10/28]
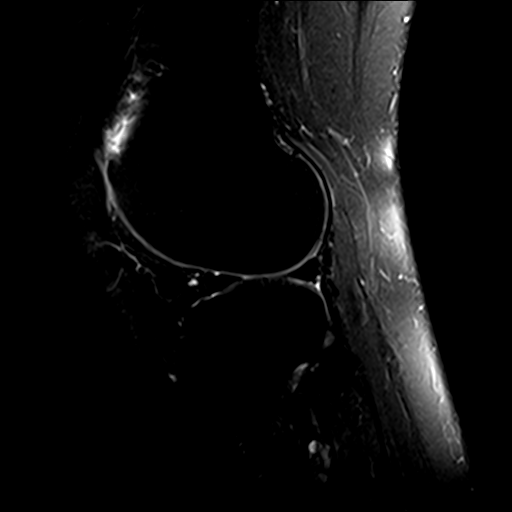
[im 19/28]
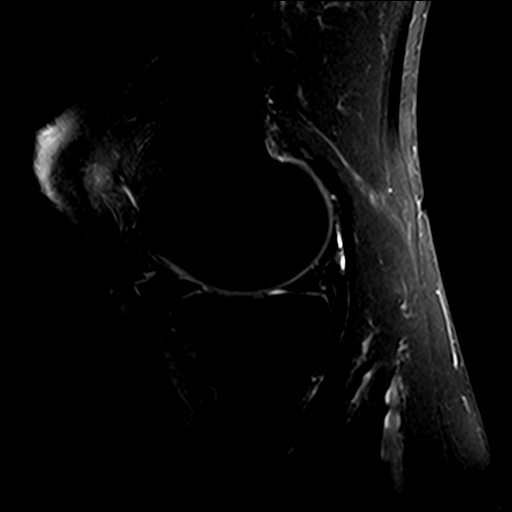

[Series 8: PD fat-sat · sagittal · 3.0mm · 0.29mm/px · 4 of 28 slices shown (1 of 3)]
[im 1/28]
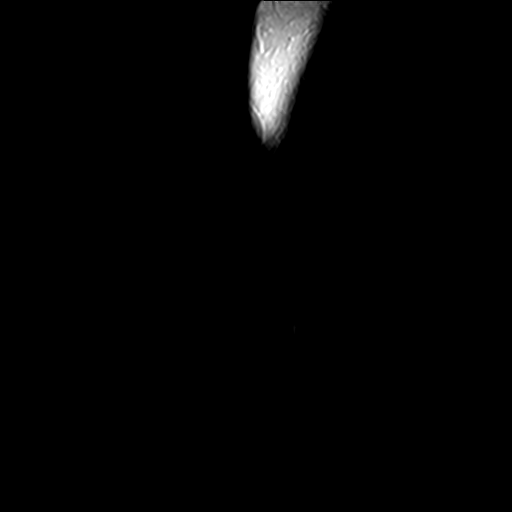
[im 10/28]
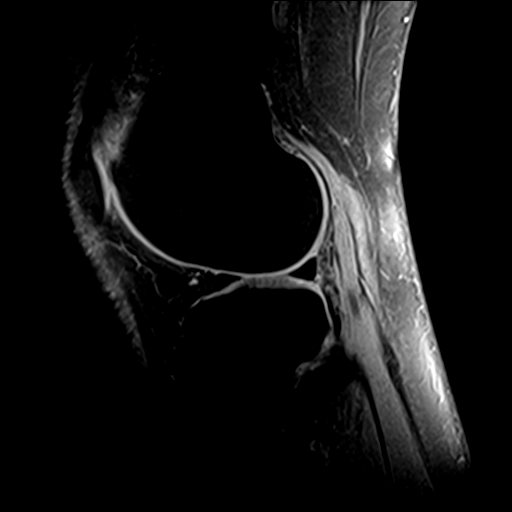
[im 19/28]
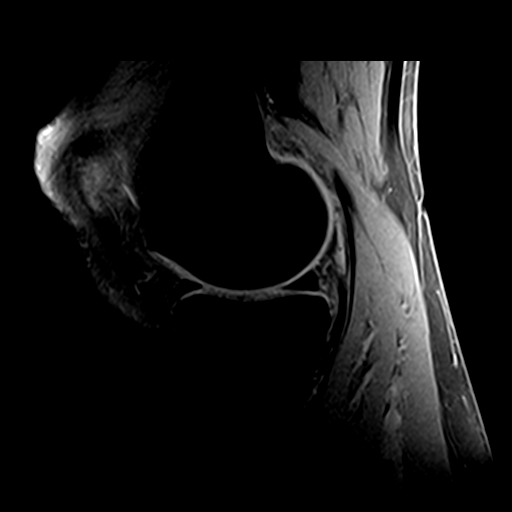
[im 28/28]
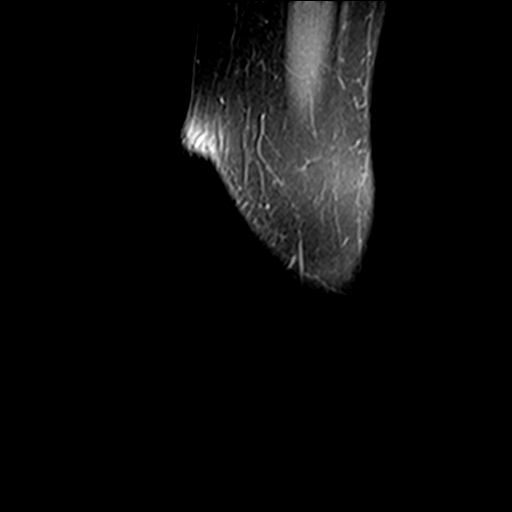

[Series 9: PD fat-sat · coronal · 3.0mm · 0.29mm/px · 5 of 32 slices shown (2 of 3)]
[im 1/32]
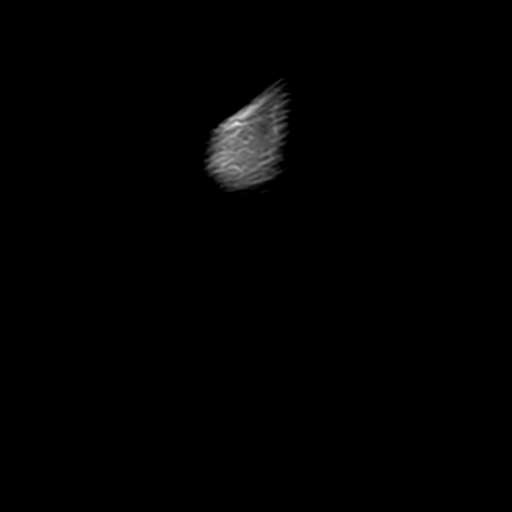
[im 8/32]
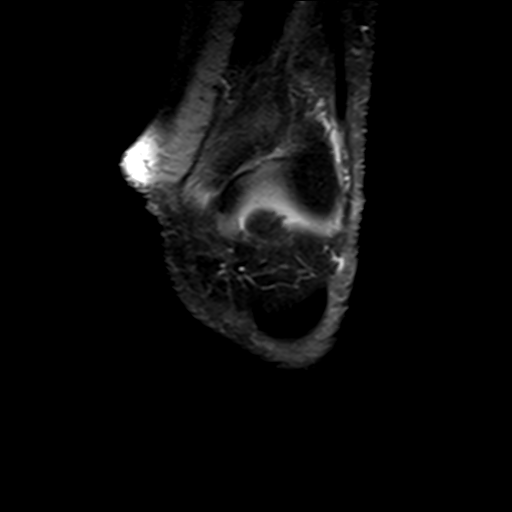
[im 16/32]
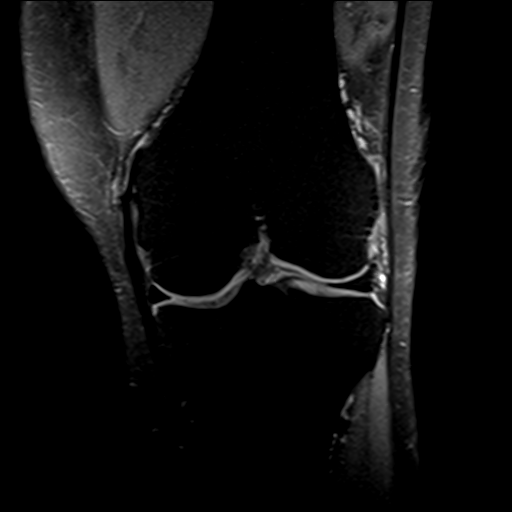
[im 24/32]
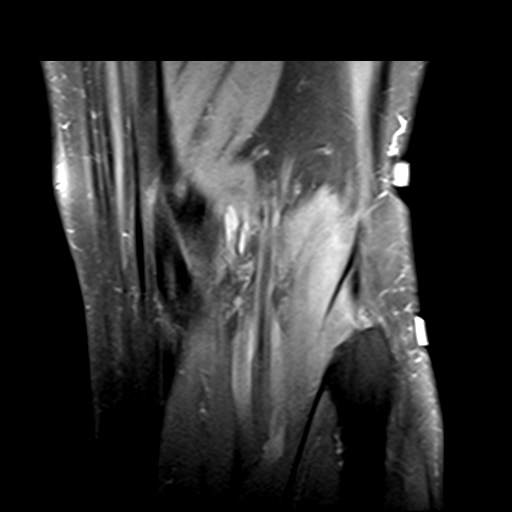
[im 32/32]
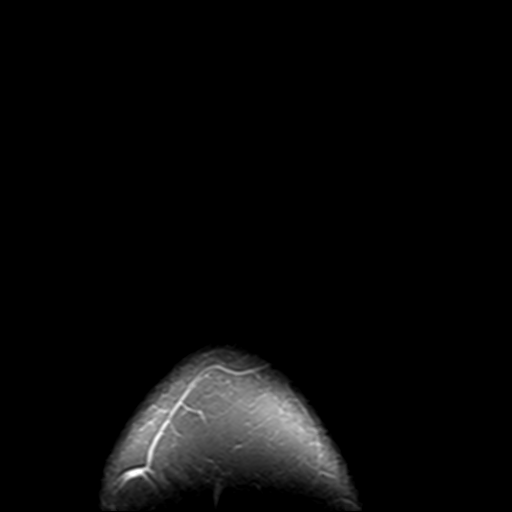

[Series 10: PD fat-sat · coronal · 2.3mm · 0.29mm/px · 2 of 11 slices shown (3 of 3)]
[im 1/11]
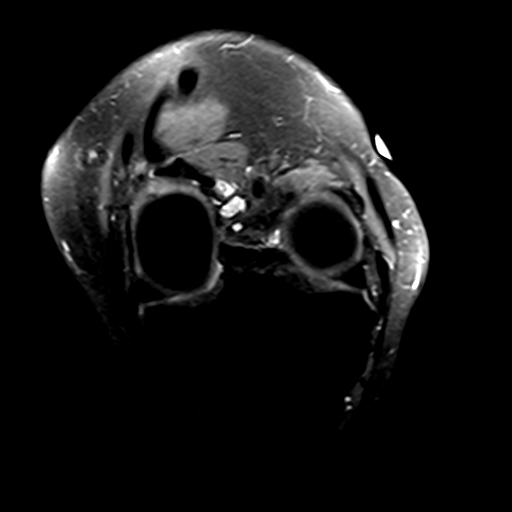
[im 11/11]
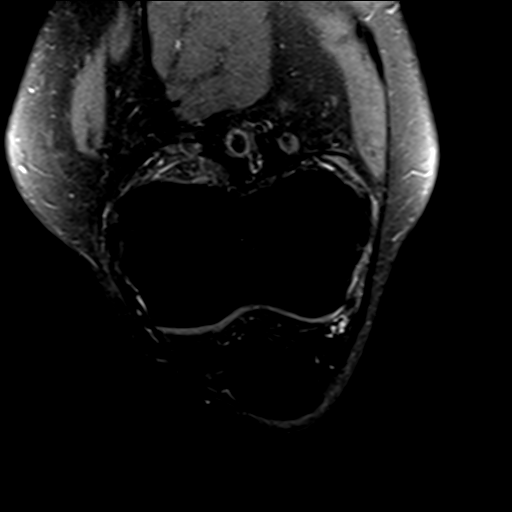

[21 of 40 positions shown; findings below may reference images not displayed]

FINDINGS: MENISCI

Medial meniscus:  Intact.

Lateral meniscus:  Intact.

LIGAMENTS

Cruciates:  Intact ACL and PCL.

Collaterals: Medial collateral ligament is intact. Lateral
collateral ligament complex is intact.

CARTILAGE

Patellofemoral: Chondral thinning with areas of fissuring most
pronounced along the medial patellar facet and patellar apex. No
trochlear chondral defect.

Medial:  No chondral defect.

Lateral:  No chondral defect.

Joint: Trace joint fluid without significant effusion. Fat pads
within normal limits. Multilobulated T2 hyperintense nonenhancing
structure at the posterior joint line abutting the lateral margin of
the medial femoral condyle measuring 3.5 x 1.1 x 1.5 cm. No internal
enhancement. Findings most compatible with a ganglion cyst.

Popliteal Fossa: No Baker's cyst. Mild tendinosis of the distal
popliteus tendon.

Extensor Mechanism: Intact quadriceps tendon and patellar tendon.
Mild proximal patellar tendinosis.

Bones: No focal marrow signal abnormality. No fracture or
dislocation.

Other: There are 2 markers placed along the skin surface at the
lateral aspect of the knee centered along the lateral joint line. No
solid or cystic mass within the soft tissues at this location. No
abnormal enhancement on postcontrast images. No soft tissue edema or
fluid.
IMPRESSION: 1. No solid or cystic mass within the soft tissues at the lateral
joint line.
2. Ganglion cyst at the posterior joint line abutting the lateral
margin of the medial femoral condyle measuring 3.5 x 1.1 x 1.5 cm.
3. Mild tendinosis of the distal popliteus tendon.
4. Mild proximal patellar tendinosis.
5. Intact menisci. Intact cruciate and collateral ligaments.

## 2022-04-07 ENCOUNTER — Ambulatory Visit: Payer: Medicare Other | Admitting: Family Medicine

## 2022-04-24 ENCOUNTER — Other Ambulatory Visit: Payer: Self-pay | Admitting: Internal Medicine

## 2022-05-05 ENCOUNTER — Ambulatory Visit: Payer: Medicare Other | Admitting: Family Medicine

## 2022-06-08 ENCOUNTER — Ambulatory Visit: Payer: Medicare Other | Admitting: Family Medicine

## 2022-06-10 DIAGNOSIS — Z23 Encounter for immunization: Secondary | ICD-10-CM | POA: Diagnosis not present

## 2022-06-15 ENCOUNTER — Encounter: Payer: Self-pay | Admitting: Internal Medicine

## 2022-06-22 DIAGNOSIS — Z01419 Encounter for gynecological examination (general) (routine) without abnormal findings: Secondary | ICD-10-CM | POA: Diagnosis not present

## 2022-06-22 DIAGNOSIS — M810 Age-related osteoporosis without current pathological fracture: Secondary | ICD-10-CM | POA: Diagnosis not present

## 2022-06-22 DIAGNOSIS — Z6822 Body mass index (BMI) 22.0-22.9, adult: Secondary | ICD-10-CM | POA: Diagnosis not present

## 2022-06-30 DIAGNOSIS — L821 Other seborrheic keratosis: Secondary | ICD-10-CM | POA: Diagnosis not present

## 2022-06-30 DIAGNOSIS — D492 Neoplasm of unspecified behavior of bone, soft tissue, and skin: Secondary | ICD-10-CM | POA: Diagnosis not present

## 2022-07-04 DIAGNOSIS — Z23 Encounter for immunization: Secondary | ICD-10-CM | POA: Diagnosis not present

## 2022-07-04 HISTORY — PX: SKIN BIOPSY: SHX1

## 2022-07-08 ENCOUNTER — Encounter: Payer: Self-pay | Admitting: Family Medicine

## 2022-07-19 ENCOUNTER — Encounter: Payer: Self-pay | Admitting: Internal Medicine

## 2022-08-01 ENCOUNTER — Encounter: Payer: Self-pay | Admitting: Internal Medicine

## 2022-08-18 DIAGNOSIS — Z7689 Persons encountering health services in other specified circumstances: Secondary | ICD-10-CM | POA: Diagnosis not present

## 2022-08-18 DIAGNOSIS — Z1231 Encounter for screening mammogram for malignant neoplasm of breast: Secondary | ICD-10-CM | POA: Diagnosis not present

## 2022-10-19 ENCOUNTER — Encounter: Payer: Self-pay | Admitting: Internal Medicine

## 2022-12-19 DIAGNOSIS — Z23 Encounter for immunization: Secondary | ICD-10-CM | POA: Diagnosis not present

## 2022-12-30 ENCOUNTER — Ambulatory Visit: Payer: Medicare Other | Admitting: Internal Medicine

## 2023-01-04 ENCOUNTER — Ambulatory Visit
Admission: RE | Admit: 2023-01-04 | Discharge: 2023-01-04 | Disposition: A | Discharge status: Home / Self Care | Payer: Medicare Other | Source: Ambulatory Visit | Attending: Nurse Practitioner | Admitting: Nurse Practitioner

## 2023-01-04 VITALS — BP 164/85 | HR 86 | Temp 97.9°F | Resp 17

## 2023-01-04 DIAGNOSIS — H6992 Unspecified Eustachian tube disorder, left ear: Secondary | ICD-10-CM | POA: Diagnosis not present

## 2023-01-04 MED ORDER — PREDNISONE 20 MG PO TABS: 40.0000 mg | ORAL_TABLET | Freq: Every day | ORAL | 0 refills | Status: AC

## 2023-01-04 NOTE — ED Provider Notes (Signed)
[facility]    CSN: [id number] Arrival date & time: 01/04/23  0841      History   Chief Complaint Chief Complaint  Patient presents with   Otalgia   Ear Fullness    HPI Shannon [name] is a 72 y.o. female presents for evaluation of ear pain.  Patient reports 3 to 4 weeks of intermittent left ear pain/pressure.  She denies any drainage from the ear, hearing changes, fever/chills, URI symptoms.  She thought it was secondary to wax and has been using over-the-counter earwax softening drops as well as irrigations with no change in her symptoms.  No OTC medications have been used for symptoms.  No other concerns at this time.   Otalgia Ear Fullness    Past Medical History:  Diagnosis Date   Allergy    RHINTIS   Asthma    Fibrocystic disease of breast    Hemorrhoids    Hx of colonic polyps    Menopause    Mitral valve prolapse    Mitral valve prolapse    PONV (postoperative nausea and vomiting)    Pt asked to be careful with her teeth, due to them being brittle from low calcium   Primary hyperparathyroidism ([place])    right   Spinal headache     Patient Active Problem List   Diagnosis Date Noted   Ganglion cyst of volar aspect of right wrist 01/12/2022   Left carpal tunnel syndrome 01/12/2022   Left knee pain 12/15/2021   Lipoma of left forearm 09/30/2021   Lumbar radiculopathy 06/22/2021   Piriformis syndrome, left 03/10/2021   AC joint pain 11/19/2020   Acute bursitis of right shoulder 11/19/2020   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 Rosana Hoes   HEMORRHOID SURGERY     Dr. Truitt Leep   NECK EXPLORATION  04/17/2017   with parathyroidectomy   PARATHYROIDECTOMY Right 01/02/2017   Procedure: RIGHT INFERIOR PARATHYROIDECTOMY;  Surgeon: Armandina Gemma, MD;  Location: [facility];  Service: General;  Laterality: Right;   PARATHYROIDECTOMY N/A 04/17/2017   Procedure: NECK EXPLORATION WITH PARATHYROIDECTOMY;  Surgeon: Armandina Gemma, MD;  Location: [facility];  Service: General;  Laterality: N/A;   SKIN BIOPSY Right 02/07/2022   nodulocystic fat nerosis   SKIN BIOPSY Right 07/04/2022   seborrheic keratosis and actinic keratosis   THUMB FUSION  1997   Dr [place]     tummy tuck      OB History   No obstetric history on file.      Home Medications    Prior to Admission medications   Medication Sig Start Date End Date Taking? Authorizing Provider  predniSONE (DELTASONE) 20 MG tablet Take 2 tablets (40 mg total) by mouth daily with breakfast for 5 days. 01/04/23 01/09/23 Yes Melynda Ripple, NP  albuterol (VENTOLIN HFA) 108 (90 Base) MCG/ACT inhaler INHALE 2 PUFFS INTO THE LUNGS EVERY 6 HOURS AS NEEDED FOR WHEEZING OR SHORTNESS OF BREATH 02/08/22   [name], MD  Cholecalciferol 50 MCG (  2000 UT) CAPS Take 2,000 Units by mouth daily.    [provider]  clindamycin (CLEOCIN) 150 MG capsule clindamycin HCl 150 mg capsule  TAKE 2 CAPSULES BY MOUTH 1 HOUR PRIOR TO APPOINTMENT AND 1 CAPSULE BY MOUTH 6 HRS AFTER 1ST DOSE Patient not taking: Reported on 01/26/2022    [provider]  Coenzyme Q10 (CO Q-10) 100 MG CAPS Take 100 mg by mouth daily.    [provider]  levothyroxine (SYNTHROID) 88 MCG tablet TAKE 1 TABLET(88 MCG) BY MOUTH DAILY BEFORE BREAKFAST 04/25/22   Philemon Kingdom, MD  Magnesium 250 MG TABS Take 250 mg by mouth daily.     [provider]  vitamin B-12 (CYANOCOBALAMIN) 1000 MCG tablet Take 1,000 mcg by mouth daily.    [provider]     Family History Family History  Problem Relation Age of Onset   Cancer Father 32       Lung cancer   Heart disease Father    Hyperlipidemia Father    Heart disease Mother 10       Sudden death presumed MI   Heart disease Brother 66       Presumed CHF   Heart disease Brother     Social History Social History   Tobacco Use   Smoking status: Former    Packs/day: 0.25    Years: 10.00    Additional pack years: 0.00    Total pack years: 2.50    Types: Cigarettes    Quit date: 06/04/1985    Years since quitting: 37.6   Smokeless tobacco: Never  Vaping Use   Vaping Use: Never used  Substance Use Topics   Alcohol use: Yes    Alcohol/week: 5.0 standard drinks of alcohol    Types: 5 Glasses of wine per week   Drug use: No     Allergies   Asa [aspirin]   Review of Systems Review of Systems  HENT:  Positive for ear pain.      Physical Exam Triage Vital Signs ED Triage Vitals  Enc Vitals Group     BP 01/04/23 0853 (!) 164/85     Pulse Rate 01/04/23 0853 86     Resp 01/04/23 0853 17     Temp 01/04/23 0853 97.9 F (36.6 C)     Temp Source 01/04/23 0853 Oral     SpO2 01/04/23 0853 96 %     Weight --      Height --      Head Circumference --      Peak Flow --      Pain Score 01/04/23 0850 4     Pain Loc --      Pain Edu? --      Excl. in [place]? --    No data found.  Updated Vital Signs BP (!) 164/85 (BP Location: Left Arm)   Pulse 86   Temp 97.9 F (36.6 C) (Oral)   Resp 17   SpO2 96%   Visual Acuity Right Eye Distance:   Left Eye Distance:   Bilateral Distance:    Right Eye Near:   Left Eye Near:    Bilateral Near:     Physical Exam Vitals and nursing note reviewed.  Constitutional:      Appearance: Normal appearance.  HENT:     Head: Normocephalic and atraumatic.     Right Ear: Tympanic membrane and ear canal normal.     Left Ear: A middle ear effusion is present. Tympanic  membrane is not erythematous.  Eyes:     Pupils: Pupils are  equal, round, and reactive to light.  Cardiovascular:     Rate and Rhythm: Normal rate.  Pulmonary:     Effort: Pulmonary effort is normal.  Skin:    General: Skin is warm and dry.  Neurological:     General: No focal deficit present.     Mental Status: She is alert and oriented to person, place, and time.  Psychiatric:        Mood and Affect: Mood normal.        Behavior: Behavior normal.      UC Treatments / Results  Labs (all labs ordered are listed, but only abnormal results are displayed) Labs Reviewed - No data to display  EKG   Radiology No results found.  Procedures Procedures (including critical care time)  Medications Ordered in UC Medications - No data to display  Initial Impression / Assessment and Plan / UC Course  I have reviewed the triage vital signs and the nursing notes.  Pertinent labs & imaging results that were available during my care of the patient were reviewed by me and considered in my medical decision making (see chart for details).     Discussed eustachian tube dysfunction Patient to continue Flonase Prednisone daily for 5 days Follow-up with PCP or ENT if symptoms do not improve ER precautions reviewed and patient verbalized understanding Final Clinical Impressions(s) / UC Diagnoses   Final diagnoses:  Dysfunction of left eustachian tube     Discharge Instructions      Continue Flonase daily Prednisone daily for 5 days Follow-up with your PCP if your symptoms do not improve Please go to emergency room for any worsening symptoms   ED Prescriptions     Medication Sig Dispense Auth. Provider   predniSONE (DELTASONE) 20 MG tablet Take 2 tablets (40 mg total) by mouth daily with breakfast for 5 days. 10 tablet Melynda Ripple, NP      PDMP not reviewed this encounter.   Melynda Ripple, NP 01/04/23 [phone]

## 2023-01-04 NOTE — Discharge Instructions (Signed)
Continue Flonase daily Prednisone daily for 5 days Follow-up with your PCP if your symptoms do not improve Please go to emergency room for any worsening symptoms

## 2023-01-04 NOTE — ED Triage Notes (Signed)
Pt reports intermittent left ear pain x 3/4 weeks. States she feels like she may have irritated the ear with a q-tip. Has been using ear drops to loosen the ear wax.

## 2023-01-09 ENCOUNTER — Ambulatory Visit: Payer: Medicare Other | Admitting: Internal Medicine

## 2023-01-27 ENCOUNTER — Encounter: Payer: Self-pay | Admitting: Internal Medicine

## 2023-01-27 ENCOUNTER — Ambulatory Visit (INDEPENDENT_AMBULATORY_CARE_PROVIDER_SITE_OTHER): Payer: Medicare Other | Admitting: Internal Medicine

## 2023-01-27 VITALS — BP 120/82 | HR 88 | Ht 64.0 in | Wt 129.4 lb

## 2023-01-27 DIAGNOSIS — E039 Hypothyroidism, unspecified: Secondary | ICD-10-CM

## 2023-01-27 DIAGNOSIS — E042 Nontoxic multinodular goiter: Secondary | ICD-10-CM | POA: Diagnosis not present

## 2023-01-27 DIAGNOSIS — E210 Primary hyperparathyroidism: Secondary | ICD-10-CM

## 2023-01-27 DIAGNOSIS — M810 Age-related osteoporosis without current pathological fracture: Secondary | ICD-10-CM

## 2023-01-27 NOTE — Patient Instructions (Signed)
Please stop at the lab. ? ?Please continue vitamin D 2000 units daily. ? ?Please continue levothyroxine 88 mcg daily. ? ?Take the thyroid hormone every day, with water, at least 30 minutes before breakfast, separated by at least 4 hours from: ?- acid reflux medications ?- calcium ?- iron ?- multivitamins ? ?Please come back for a follow-up appointment in 1 year. ? ?

## 2023-01-27 NOTE — Progress Notes (Unsigned)
Patient ID: Shannon [name], female   DOB: 16-May-1951, 72 y.o.   MRN: [id number]   HPI  DAJANEE [name] is a 72 y.o.-year-old female, initially referred by her PCP, Dr. Susann [name], returning for f/u for H/o Primary hyperparathyroidism, OP, multi-nodular thyroid. Last visit 1 year ago.  Interim history: She still has back pain.  Previously on epidural steroid injections, which did not help.  She got an injection with platelet rich plasma, which helped significantly.  She continues to stay active and exercising (walking) for 30 min a day, planks, weights. No fractures or falls since last visit.   No disequilibrium/orthostasis/vertigo/poor vision. She had a Prednisone taper for otitis - finished 01/13/2023.  Reviewed and addended hx:  Pt has had hypercalcemia at least since 2012.  I have reviewed pertinent labs:  Lab Results  Component Value Date   PTH 33 06/05/2017   PTH Comment 06/05/2017   PTH 118 (H) 02/03/2017   PTH Comment 02/03/2017   PTH 127 (H) 10/07/2016   PTH 169 (H) 09/07/2016   CALCIUM 10.0 01/26/2022   CALCIUM 9.7 11/26/2020   CALCIUM 9.8 08/13/2019   CALCIUM 9.5 03/05/2019   CALCIUM 9.5 05/31/2018   CALCIUM 10.0 06/05/2017   CALCIUM 8.8 (L) 04/18/2017   CALCIUM 10.8 (H) 04/11/2017   CALCIUM 11.1 (H) 02/03/2017   CALCIUM 11.8 (H) 10/07/2016  08/13/2019: Calcium 9.8 02/01/2018: Calcium 9.4, PTH 41 -per checked by Dr. Gerrit Friends  Further labs confirm primary hyperparathyroidism (please see my previous visit notes).  11/22/2016: Technetium sestamibi scan: 1. No evidence of ectopic parathyroid adenoma. 2. Asymmetric nodular activity in the left peritracheal region on the delayed images could reflect asymmetric thyroid activity or a left-sided parathyroid adenoma.  11/30/2016: Neck ultrasound: There are 2 thyroid nodules as described. - The left mid thyroid nodule measures 2.7 x 2.2 x 1.2 cm solid/almost completely solid (2), isoechoic (1), and meets fine-needle aspiration  criteria.  - The other nodule measures 0.9 cm x 0.7 x 0.6 cm,  solid/almost completely solid (2), hyperechoic (1), taller-than-wide (3), but not meeting criteria for biopsy. - There are no obvious masses external to the thyroid gland to suggest parathyroid adenoma.  12/09/2016: 4D CT: 1. 13 mm mass posterior and inferior to the right thyroid lobe suspicious for parathyroid adenoma. 2. Additional subcentimeter soft tissue nodules more inferiorly in the right neck, indeterminate. These may reflect small lymph nodes, however an additional parathyroid adenoma is not excluded (particularly the most inferior nodule just above the thoracic inlet). 3. 2.2 cm left thyroid nodule as described on ultrasound.  12/14/2016: Thyroid nodule FNA: benign  She had parathyroidectomy by Dr. Gerrit Friends - 01/02/2017.  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  She initially felt well after the surgery, but then started to be fatigued again.  Calcium remains high after the surgery: 11.9 (01/19/2017), while PTH decreased to 76 at follow-up with Dr. Gerrit Friends 2 weeks postop.  However, it then increased to 118.  She had a second intervention: Parathyroidectomy in 04/17/2017 >> a second parathyroid adenoma was resected: Parathyroid gland, Left - PARATHYROID ADENOMA.  Her calcium was 8.8 first day postop, then 04/27/2017: Ca 9.2, PTH was 61. She feels great after the surgery.  She feels great after the surgery.  Continues to exercise-running.  Review thyroid ultrasounds reports: Thyroid U/S (02/12/2018):  Left mid nodule 2 measures 2.4 x 2.3 x 1.4 cm and previously measured 2.7 x 2.2 x 1.2 cm. Biopsy was performed 12/14/2016. Right lobe nodule measures 0.8 cm and previously measured  0.9 cm. It does not meet criteria for biopsy nor follow-up. IMPRESSION: Left nodule 2 is stable and previously underwent biopsy on 12/14/2016.  Thyroid ultrasound (01/05/2021): Parenchymal Echotexture: Moderately heterogeneous Isthmus: 0.2 cm Right lobe:  3.5 x 1.7 x 1.6 cm Left lobe: 3.5 x 2.0 x 1.8 cm ______________________________________________________   Nodule 1: 0.6 cm isoechoic solid nodule in the mid thyroid lobe is not significantly changed in size since prior exam and does not meet criteria for FNA or imaging follow-up. _________________________________________________________   Nodule 2: 2.3 x 2.0 x 1.3 cm solid isoechoic nodule located in the mid left thyroid lobe is not significantly changed in size compared to prior examination where it measured 2.4 x 2.3 x 1.4 cm. Prior FNA of this nodule was performed on 12/14/2016. Please correlate with results.   IMPRESSION: Previously biopsied left mid thyroid nodule measuring 2.3 x 2.0 x 1.3 cm is not significantly changed in size.  Hypothyroidism:  Pt is on levothyroxine 88 mcg daily, taken: - in am (1-2 am) - fasting - at least 2h from b'fast - no Ca, Fe, MVI, PPIs - not on Biotin On B12, D3, CoQ10, Mg.  Reviewed her TFTs: Lab Results  Component Value Date   TSH 1.27 12/24/2021   TSH 2.760 11/26/2020   TSH 1.69 12/24/2019   TSH 2.270 08/13/2019   TSH 6.30 (H) 06/06/2019   TSH 9.88 (H) 04/11/2019   TSH 16.35 (H) 03/05/2019   TSH 6.57 (H) 03/02/2018  ? 01/2018: TSH 8.98 Lab Results  Component Value Date   FREET4 1.10 12/24/2021   FREET4 1.17 12/24/2019   FREET4 0.97 06/06/2019   FREET4 0.66 04/11/2019   FREET4 0.57 (L) 03/05/2019   FREET4 0.68 03/02/2018   Lab Results  Component Value Date   T3FREE 3.0 03/05/2019   T3FREE 3.2 03/02/2018   Osteoporosis:  Reviewed available DXA scan reports: Date L1-L4 T score FN T score  12/09/2020 ([facility]) -3.0 RFN n/a LFN -3.0   Date L1-L4 T score FN T score  05/27/2016 ([facility]) -3.6 RFN -2.9 LFN -3.2   Date L1-L4 T score FN T score  05/17/2012  ([facility]) -3.1  RFN -2.7 LFN -3.0    She refuse DXA scans and antiresorptive medicine.  She has a history of left thumb  fracture in 2001 while playing tennis. She had several falls playing sports or missing a step >> no fxs.  No history of kidney stones.  No CKD. Last BUN/Cr: Lab Results  Component Value Date   BUN 9 01/26/2022   BUN 10 11/26/2020   CREATININE 0.64 01/26/2022   CREATININE 0.63 11/26/2020   No history of vitamin D deficiency: Lab Results  Component Value Date   VD25OH 46.27 12/24/2021   VD25OH 40.9 12/24/2020   VD25OH 45.55 12/24/2019   VD25OH 46.31 03/05/2019   VD25OH 41.58 03/02/2018   VD25OH 32.31 02/03/2017   VD25OH 35 08/24/2016   VD25OH 31 06/04/2012   She continues on 2000 units vitamin D daily.  She is not eating dairy.  Her son is allergic to dairy.  Pt does not have a FH of hypercalcemia, pituitary tumors, thyroid cancer.   She continues intermittent fasting and is a vegetarian.  ROS: + see HPI  I reviewed pt's medications, allergies, PMH, social hx, family hx, and changes were documented in the history of present illness. Otherwise, unchanged from my initial visit note.  Past Medical History:  Diagnosis Date   Allergy    RHINTIS  Asthma    Fibrocystic disease of breast    Hemorrhoids    Hx of colonic polyps    Menopause    Mitral valve prolapse    Mitral valve prolapse    PONV (postoperative nausea and vomiting)    Pt asked to be careful with her teeth, due to them being brittle from low calcium   Primary hyperparathyroidism ([facility])    right   Spinal headache    Past Surgical History:  Procedure Laterality Date   APPENDECTOMY  1964   COLONOSCOPY     CYSTOSTOMY W/ BLADDER BIOPSY     Dr. Earlene Plater   HEMORRHOID SURGERY     Dr. Luan Pulling   NECK EXPLORATION  04/17/2017   with parathyroidectomy   PARATHYROIDECTOMY Right 01/02/2017   Procedure: RIGHT INFERIOR PARATHYROIDECTOMY;  Surgeon: Darnell Level, MD;  [facility];  Service: General;  Laterality: Right;   PARATHYROIDECTOMY N/A 04/17/2017   Procedure: NECK EXPLORATION WITH PARATHYROIDECTOMY;   Surgeon: Darnell Level, MD;  Location: [facility] OR;  Service: General;  Laterality: N/A;   SKIN BIOPSY Right 02/07/2022   nodulocystic fat nerosis   SKIN BIOPSY Right 07/04/2022   seborrheic keratosis and actinic keratosis   THUMB FUSION  1997   Dr Jillyn Hidden   TONSILLECTOMY AND ADENOIDECTOMY     tummy tuck     Social History   Social History   Marital status: Married    Spouse name: N/A   Number of children: 2   Occupational History   retired    Social History Main Topics   Smoking status: Former Smoker    Packs/day: 0.25    Years: 10.00    Types: Cigarettes    Quit date: 06/04/1985   Smokeless tobacco: Never Used   Alcohol use 3.0 oz/week    5 Glasses of wine per week   Drug use: No   Current Outpatient Medications on File Prior to Visit  Medication Sig Dispense Refill   albuterol (VENTOLIN HFA) 108 (90 Base) MCG/ACT inhaler INHALE 2 PUFFS INTO THE LUNGS EVERY 6 HOURS AS NEEDED FOR WHEEZING OR SHORTNESS OF BREATH 18 g 0   Cholecalciferol 50 MCG (2000 UT) CAPS Take 2,000 Units by mouth daily.     clindamycin (CLEOCIN) 150 MG capsule clindamycin HCl 150 mg capsule  TAKE 2 CAPSULES BY MOUTH 1 HOUR PRIOR TO APPOINTMENT AND 1 CAPSULE BY MOUTH 6 HRS AFTER 1ST DOSE (Patient not taking: Reported on 01/26/2022)     Coenzyme Q10 (CO Q-10) 100 MG CAPS Take 100 mg by mouth daily.     levothyroxine (SYNTHROID) 88 MCG tablet TAKE 1 TABLET(88 MCG) BY MOUTH DAILY BEFORE BREAKFAST 90 tablet 3   Magnesium 250 MG TABS Take 250 mg by mouth daily.      vitamin B-12 (CYANOCOBALAMIN) 1000 MCG tablet Take 1,000 mcg by mouth daily.     No current facility-administered medications on file prior to visit.   Allergies  Allergen Reactions   Asa [Aspirin] Other (See Comments)    Minor bleeding   Family History  Problem Relation Age of Onset   Cancer Father 17       Lung cancer   Heart disease Father    Hyperlipidemia Father    Heart disease Mother 33       Sudden death presumed MI   Heart disease  Brother 26       Presumed CHF   Heart disease Brother    Significant FH of early death 2/2 AMIs in parents  and brothers.  PE: BP 120/82 (BP Location: Left Arm, Patient Position: Sitting, Cuff Size: Normal)   Pulse 88   Ht 5\' 4"  (1.626 m)   Wt 129 lb 6.4 oz (58.7 kg)   SpO2 96%   BMI 22.21 kg/m  Wt Readings from Last 3 Encounters:  01/27/23 129 lb 6.4 oz (58.7 kg)  03/14/22 132 lb (59.9 kg)  02/07/22 133 lb (60.3 kg)   Constitutional: Normal weight, in NAD Eyes: EOMI, no exophthalmos ENT: no thyromegaly, but left thyroid nodule easily palpable and mobile; no cervical lymphadenopathy Cardiovascular: RRR, No MRG Respiratory: CTA B Musculoskeletal: no deformities Skin: no rashes Neurological: no tremor with outstretched hands  Assessment: 1. Primary hyperparathyroidism  2. Thyroid nodules  3.  Subclinical hypothyroidism  4. OP  Plan: 1. Patient with history of primary hyperparathyroidism, with highest calcium level being 12.1 and highest PTH level being 169.  Several investigations confirmed primary hyperparathyroidism and she was referred to surgery.  She had a right parathyroid adenoma resected in 12/2017, however, unfortunately, calcium and PTH remain high after the surgery.  She had a second, left, parathyroid adenoma resected in 04/2017 by Dr. Gerrit Friends.  After the surgery, her calcium and PTH levels decreased and she started to feel great.  She had more energy and less anxiety. -she continues on 2000 units vitamin D daily-we will recheck the level today -Latest calcium level was normal Lab Results  Component Value Date   CALCIUM 10.0 01/26/2022   PHOS 2.3 10/07/2016  -Will check her calcium level today -I will see her back in 1 year  2. Thyroid nodules -She denies neck compression symptoms -In the past, she felt that her left thyroid nodule was fluctuating in size, and I suspect this may be due to possibly inflammation or fluid accumulation.  Of note, the nodule  measured 2.4 cm and the biopsy was benign -Per review of the most recent thyroid ultrasound report from 2022, the dominant nodule is still large, but not significantly changed or even slightly smaller than before.  No -No intervention needed for now but we will continue to follow her clinically  3.  Subclinical hypothyroidism - latest thyroid labs reviewed with pt. >> normal: Lab Results  Component Value Date   TSH 1.27 12/24/2021  - she continues on LT4 88 mcg daily - pt feels good on this dose. - we discussed about taking the thyroid hormone every day, with water, >30 minutes before breakfast, separated by >4 hours from acid reflux medications, calcium, iron, multivitamins. Pt. is taking it in the middle of the night, but she has dinner early. - will check thyroid tests today: TSH and fT4 - If labs are abnormal, she will need to return for repeat TFTs in 1.5 months  4. OP  -No falls or fractures since last visit -Reviewing her bone density report from 2017: Her T-scores were worse.  She had another DXA scan on 12/09/2020 (however, on a different machine, at least per imaging) and the T-scores were slightly better.   -She is due for another DXA scan -She continues to refuse antiresorptive medications and try to manage osteoporosis naturally.  At last visit, she was not exercising much due to back pain, but was planning to restart  Component     Latest Ref Rng 01/27/2023  TSH     0.40 - 4.50 mIU/L 2.21   T4,Free(Direct)     0.8 - 1.8 ng/dL 1.3   Vitamin D, [id number]     30 -  100 ng/mL 54   Calcium Ionized     4.7 - 5.5 mg/dL 4.9   All labs are normal.  Carlus Pavlov, MD PhD [facility]

## 2023-01-29 LAB — T4, FREE: Free T4: 1.3 ng/dL (ref 0.8–1.8)

## 2023-01-29 LAB — VITAMIN D 25 HYDROXY (VIT D DEFICIENCY, FRACTURES): Vit D, 25-Hydroxy: 54 ng/mL (ref 30–100)

## 2023-01-29 LAB — CALCIUM, IONIZED: Calcium, Ion: 4.9 mg/dL (ref 4.7–5.5)

## 2023-01-29 LAB — TSH: TSH: 2.21 mIU/L (ref 0.40–4.50)

## 2023-01-31 ENCOUNTER — Ambulatory Visit: Payer: Medicare Other | Admitting: Family Medicine

## 2023-02-01 ENCOUNTER — Telehealth: Payer: Self-pay | Admitting: Family Medicine

## 2023-02-01 NOTE — Telephone Encounter (Signed)
Contacted Shannon [name] to schedule their annual wellness visit. Appointment made for 02/07/23.  Shannon [name] AWV direct phone # [phone]

## 2023-02-07 ENCOUNTER — Ambulatory Visit (INDEPENDENT_AMBULATORY_CARE_PROVIDER_SITE_OTHER): Payer: Medicare Other

## 2023-02-07 VITALS — BP 122/78 | HR 76 | Ht 64.5 in | Wt 127.0 lb

## 2023-02-07 DIAGNOSIS — Z0000 Encounter for general adult medical examination without abnormal findings: Secondary | ICD-10-CM | POA: Diagnosis not present

## 2023-02-07 NOTE — Patient Instructions (Signed)
Shannon [name] , Thank you for taking time to come for your Medicare Wellness Visit. I appreciate your ongoing commitment to your health goals. Please review the following plan we discussed and let me know if I can assist you in the future.   These are the goals we discussed:  Goals      Patient Stated     02/07/2023, no goals        This is a list of the screening recommended for you and due dates:  Health Maintenance  Topic Date Due   Zoster (Shingles) Vaccine (2 of 2) 03/04/2020   Mammogram  03/19/2022   Pneumonia Vaccine (2 of 2 - PPSV23 or PCV20) 01/26/2025*   Flu Shot  05/11/2023   Medicare Annual Wellness Visit  02/07/2024   Cologuard (Stool DNA test)  02/08/2025   DTaP/Tdap/Td vaccine (4 - Td or Tdap) 12/07/2029   DEXA scan (bone density measurement)  Completed   COVID-19 Vaccine  Completed   Hepatitis C Screening: USPSTF Recommendation to screen - Ages 84-79 yo.  Completed   HPV Vaccine  Aged Out  *Topic was postponed. The date shown is not the original due date.    Advanced directives: Please bring a copy of your POA (Power of Attorney) and/or Living Will to your next appointment.   Conditions/risks identified: none  Next appointment: Follow up in one year for your annual wellness visit    Preventive Care 65 Years and Older, Female Preventive care refers to lifestyle choices and visits with your health care provider that can promote health and wellness. What does preventive care include? A yearly physical exam. This is also called an annual well check. Dental exams once or twice a year. Routine eye exams. Ask your health care provider how often you should have your eyes checked. Personal lifestyle choices, including: Daily care of your teeth and gums. Regular physical activity. Eating a healthy diet. Avoiding tobacco and drug use. Limiting alcohol use. Practicing safe sex. Taking low-dose aspirin every day. Taking vitamin and mineral supplements as recommended by  your health care provider. What happens during an annual well check? The services and screenings done by your health care provider during your annual well check will depend on your age, overall health, lifestyle risk factors, and family history of disease. Counseling  Your health care provider may ask you questions about your: Alcohol use. Tobacco use. Drug use. Emotional well-being. Home and relationship well-being. Sexual activity. Eating habits. History of falls. Memory and ability to understand (cognition). Work and work Astronomer. Reproductive health. Screening  You may have the following tests or measurements: Height, weight, and BMI. Blood pressure. Lipid and cholesterol levels. These may be checked every 5 years, or more frequently if you are over 61 years old. Skin check. Lung cancer screening. You may have this screening every year starting at age 57 if you have a 30-pack-year history of smoking and currently smoke or have quit within the past 15 years. Fecal occult blood test (FOBT) of the stool. You may have this test every year starting at age 69. Flexible sigmoidoscopy or colonoscopy. You may have a sigmoidoscopy every 5 years or a colonoscopy every 10 years starting at age 62. Hepatitis C blood test. Hepatitis B blood test. Sexually transmitted disease (STD) testing. Diabetes screening. This is done by checking your blood sugar (glucose) after you have not eaten for a while (fasting). You may have this done every 1-3 years. Bone density scan. This is done to screen  for osteoporosis. You may have this done starting at age 76. Mammogram. This may be done every 1-2 years. Talk to your health care provider about how often you should have regular mammograms. Talk with your health care provider about your test results, treatment options, and if necessary, the need for more tests. Vaccines  Your health care provider may recommend certain vaccines, such as: Influenza  vaccine. This is recommended every year. Tetanus, diphtheria, and acellular pertussis (Tdap, Td) vaccine. You may need a Td booster every 10 years. Zoster vaccine. You may need this after age 74. Pneumococcal 13-valent conjugate (PCV13) vaccine. One dose is recommended after age 44. Pneumococcal polysaccharide (PPSV23) vaccine. One dose is recommended after age 63. Talk to your health care provider about which screenings and vaccines you need and how often you need them. This information is not intended to replace advice given to you by your health care provider. Make sure you discuss any questions you have with your health care provider. Document Released: 10/23/2015 Document Revised: 06/15/2016 Document Reviewed: 07/28/2015 Elsevier Interactive Patient Education  2017 [place] Prevention in the Home Falls can cause injuries. They can happen to people of all ages. There are many things you can do to make your home safe and to help prevent falls. What can I do on the outside of my home? Regularly fix the edges of walkways and driveways and fix any cracks. Remove anything that might make you trip as you walk through a door, such as a raised step or threshold. Trim any bushes or trees on the path to your home. Use bright outdoor lighting. Clear any walking paths of anything that might make someone trip, such as rocks or tools. Regularly check to see if handrails are loose or broken. Make sure that both sides of any steps have handrails. Any raised decks and porches should have guardrails on the edges. Have any leaves, snow, or ice cleared regularly. Use sand or salt on walking paths during winter. Clean up any spills in your garage right away. This includes oil or grease spills. What can I do in the bathroom? Use night lights. Install grab bars by the toilet and in the tub and shower. Do not use towel bars as grab bars. Use non-skid mats or decals in the tub or shower. If you  need to sit down in the shower, use a plastic, non-slip stool. Keep the floor dry. Clean up any water that spills on the floor as soon as it happens. Remove soap buildup in the tub or shower regularly. Attach bath mats securely with double-sided non-slip rug tape. Do not have throw rugs and other things on the floor that can make you trip. What can I do in the bedroom? Use night lights. Make sure that you have a light by your bed that is easy to reach. Do not use any sheets or blankets that are too big for your bed. They should not hang down onto the floor. Have a firm chair that has side arms. You can use this for support while you get dressed. Do not have throw rugs and other things on the floor that can make you trip. What can I do in the kitchen? Clean up any spills right away. Avoid walking on wet floors. Keep items that you use a lot in easy-to-reach places. If you need to reach something above you, use a strong step stool that has a grab bar. Keep electrical cords out of the way. Do  not use floor polish or wax that makes floors slippery. If you must use wax, use non-skid floor wax. Do not have throw rugs and other things on the floor that can make you trip. What can I do with my stairs? Do not leave any items on the stairs. Make sure that there are handrails on both sides of the stairs and use them. Fix handrails that are broken or loose. Make sure that handrails are as long as the stairways. Check any carpeting to make sure that it is firmly attached to the stairs. Fix any carpet that is loose or worn. Avoid having throw rugs at the top or bottom of the stairs. If you do have throw rugs, attach them to the floor with carpet tape. Make sure that you have a light switch at the top of the stairs and the bottom of the stairs. If you do not have them, ask someone to add them for you. What else can I do to help prevent falls? Wear shoes that: Do not have high heels. Have rubber  bottoms. Are comfortable and fit you well. Are closed at the toe. Do not wear sandals. If you use a stepladder: Make sure that it is fully opened. Do not climb a closed stepladder. Make sure that both sides of the stepladder are locked into place. Ask someone to hold it for you, if possible. Clearly mark and make sure that you can see: Any grab bars or handrails. First and last steps. Where the edge of each step is. Use tools that help you move around (mobility aids) if they are needed. These include: Canes. Walkers. Scooters. Crutches. Turn on the lights when you go into a dark area. Replace any light bulbs as soon as they burn out. Set up your furniture so you have a clear path. Avoid moving your furniture around. If any of your floors are uneven, fix them. If there are any pets around you, be aware of where they are. Review your medicines with your doctor. Some medicines can make you feel dizzy. This can increase your chance of falling. Ask your doctor what other things that you can do to help prevent falls. This information is not intended to replace advice given to you by your health care provider. Make sure you discuss any questions you have with your health care provider. Document Released: 07/23/2009 Document Revised: 03/03/2016 Document Reviewed: 10/31/2014 Elsevier Interactive Patient Education  2017 [employer].

## 2023-02-07 NOTE — Progress Notes (Signed)
I connected with  Shannon [name] on 02/07/23 by a audio enabled telemedicine application and verified that I am speaking with the correct person using two identifiers.  Patient Location: Home  Provider Location: Office/Clinic  I discussed the limitations of evaluation and management by telemedicine. The patient expressed understanding and agreed to proceed.  Subjective:   Shannon [name] is a 72 y.o. female who presents for Medicare Annual (Subsequent) preventive examination.  Review of Systems     Cardiac Risk Factors include: advanced age (>42men, >28 women)     Objective:    Today's Vitals   02/07/23 1527  BP: 122/78  Pulse: 76  SpO2: 96%  Weight: 127 lb (57.6 kg)  Height: 5' 4.5" (1.638 m)   Body mass index is 21.46 kg/m.     02/07/2023    3:32 PM 01/26/2022    8:55 AM 11/26/2020    9:47 AM 04/17/2017    4:10 PM 04/17/2017    9:40 AM 04/11/2017   12:47 PM 01/02/2017    8:27 AM  Advanced Directives  Does Patient Have a Medical Advance Directive? Yes Yes Yes  Yes Yes Yes  Type of Estate agent of [place];Living will Living will;Healthcare Power of Attorney Living will;Healthcare Power of [employer] Power of [place];Living will Healthcare Power of [place];Living will Healthcare Power of [place];Living will Living will;Healthcare Power of Attorney  Does patient want to make changes to medical advance directive?   No - Patient declined No - Patient declined  No - Patient declined No - Patient declined  Copy of Healthcare Power of Attorney in Chart? No - copy requested No - copy requested No - copy requested No - copy requested No - copy requested No - copy requested No - copy requested    Current Medications (verified) Outpatient Encounter Medications as of 02/07/2023  Medication Sig   albuterol (VENTOLIN HFA) 108 (90 Base) MCG/ACT inhaler INHALE 2 PUFFS INTO THE LUNGS EVERY 6 HOURS AS NEEDED FOR WHEEZING OR SHORTNESS OF BREATH   Cholecalciferol  50 MCG (2000 UT) CAPS Take 2,000 Units by mouth daily.   Coenzyme Q10 (CO Q-10) 100 MG CAPS Take 100 mg by mouth daily.   levothyroxine (SYNTHROID) 88 MCG tablet TAKE 1 TABLET(88 MCG) BY MOUTH DAILY BEFORE BREAKFAST   Magnesium 250 MG TABS Take 250 mg by mouth daily.    vitamin B-12 (CYANOCOBALAMIN) 1000 MCG tablet Take 1,000 mcg by mouth daily.   clindamycin (CLEOCIN) 150 MG capsule clindamycin HCl 150 mg capsule  TAKE 2 CAPSULES BY MOUTH 1 HOUR PRIOR TO APPOINTMENT AND 1 CAPSULE BY MOUTH 6 HRS AFTER 1ST DOSE (Patient not taking: Reported on 01/26/2022)   No facility-administered encounter medications on file as of 02/07/2023.    Allergies (verified) Asa [aspirin]   History: Past Medical History:  Diagnosis Date   Allergy    RHINTIS   Asthma    Fibrocystic disease of breast    Hemorrhoids    Hx of colonic polyps    Menopause    Mitral valve prolapse    Mitral valve prolapse    PONV (postoperative nausea and vomiting)    Pt asked to be careful with her teeth, due to them being brittle from low calcium   Primary hyperparathyroidism (HCC)    right   Spinal headache    Past Surgical History:  Procedure Laterality Date   APPENDECTOMY  1964   COLONOSCOPY     CYSTOSTOMY W/ BLADDER BIOPSY  Dr. Earlene Plater   HEMORRHOID SURGERY     Dr. Luan Pulling   NECK EXPLORATION  04/17/2017   with parathyroidectomy   PARATHYROIDECTOMY Right 01/02/2017   Procedure: RIGHT INFERIOR PARATHYROIDECTOMY;  Surgeon: Darnell Level, MD;  Location: [facility] OR;  Service: General;  Laterality: Right;   PARATHYROIDECTOMY N/A 04/17/2017   Procedure: NECK EXPLORATION WITH PARATHYROIDECTOMY;  Surgeon: Darnell Level, MD;  Location: [facility] OR;  Service: General;  Laterality: N/A;   SKIN BIOPSY Right 02/07/2022   nodulocystic fat nerosis   SKIN BIOPSY Right 07/04/2022   seborrheic keratosis and actinic keratosis   THUMB FUSION  1997   Dr Jillyn Hidden   TONSILLECTOMY AND ADENOIDECTOMY     tummy tuck     Family History  Problem  Relation Age of Onset   Cancer Father 67       Lung cancer   Heart disease Father    Hyperlipidemia Father    Heart disease Mother 57       Sudden death presumed MI   Heart disease Brother 61       Presumed CHF   Heart disease Brother    Social History   Socioeconomic History   Marital status: Married    Spouse name: Not on file   Number of children: 2   Years of education: Not on file   Highest education level: Not on file  Occupational History   Occupation: retired  Tobacco Use   Smoking status: Former    Packs/day: 0.25    Years: 10.00    Additional pack years: 0.00    Total pack years: 2.50    Types: Cigarettes    Quit date: 06/04/1985    Years since quitting: 37.7   Smokeless tobacco: Never  Vaping Use   Vaping Use: Never used  Substance and Sexual Activity   Alcohol use: Yes    Alcohol/week: 5.0 standard drinks of alcohol    Types: 5 Glasses of wine per week   Drug use: No   Sexual activity: Yes  Other Topics Concern   Not on file  Social History Narrative   Not on file   Social Determinants of Health   Financial Resource Strain: Low Risk  (02/07/2023)   Overall Financial Resource Strain (CARDIA)    Difficulty of Paying Living Expenses: Not hard at all  Food Insecurity: No Food Insecurity (02/07/2023)   Hunger Vital Sign    Worried About Running Out of Food in the Last Year: Never true    Ran Out of Food in the Last Year: Never true  Transportation Needs: No Transportation Needs (02/07/2023)   PRAPARE - Administrator, Civil Service (Medical): No    Lack of Transportation (Non-Medical): No  Physical Activity: Sufficiently Active (02/07/2023)   Exercise Vital Sign    Days of Exercise per Week: 7 days    Minutes of Exercise per Session: 30 min  Stress: No Stress Concern Present (02/07/2023)   [employer] of Occupational Health - Occupational Stress Questionnaire    Feeling of Stress : Not at all  Social Connections: Not on file     Tobacco Counseling Counseling given: Not Answered   Clinical Intake:  Pre-visit preparation completed: Yes  Pain : No/denies pain     Nutritional Status: BMI of 19-24  Normal Nutritional Risks: None Diabetes: No  How often do you need to have someone help you when you read instructions, pamphlets, or other written materials from your doctor or pharmacy?: 1 - Never  Diabetic? no  Interpreter Needed?: No  Information entered by :: NAllen LPN   Activities of Daily Living    02/07/2023    3:33 PM  In your present state of health, do you have any difficulty performing the following activities:  Hearing? 0  Vision? 0  Difficulty concentrating or making decisions? 0  Walking or climbing stairs? 0  Dressing or bathing? 0  Doing errands, shopping? 0  Preparing Food and eating ? N  Using the Toilet? N  In the past six months, have you accidently leaked urine? N  Do you have problems with loss of bowel control? N  Managing your Medications? N  Managing your Finances? N  Housekeeping or managing your Housekeeping? N    Patient Care Team: Ronnald Nian, MD as PCP - General (Family Medicine)  Indicate any recent Medical Services you may have received from other than [facility] providers in the past year (date may be approximate).     Assessment:   This is a routine wellness examination for Adely.  Hearing/Vision screen Vision Screening - Comments:: Regular eye exams, [facility]  Dietary issues and exercise activities discussed: Current Exercise Habits: Home exercise routine, Type of exercise: walking, Time (Minutes): 30, Frequency (Times/Week): 7, Weekly Exercise (Minutes/Week): 210   Goals Addressed             This Visit's Progress    Patient Stated       02/07/2023, no goals       Depression Screen    02/07/2023    3:33 PM 01/26/2022    8:56 AM 01/15/2021   10:35 AM 11/26/2020    9:48 AM 03/30/2020    8:53 AM 08/13/2019    1:46 PM 05/31/2018   10:58 AM   PHQ 2/9 Scores  PHQ - 2 Score 0 0 0 0 0 0 0  PHQ- 9 Score 0          Fall Risk    02/07/2023    3:33 PM 01/26/2022    8:56 AM 01/15/2021   10:35 AM 11/26/2020    9:47 AM 08/13/2019    1:45 PM  Fall Risk   Falls in the past year? 0 0 0 0 0  Number falls in past yr: 0 0 0 0   Injury with Fall? 0 0 0 0   Risk for fall due to : Medication side effect No Fall Risks No Fall Risks No Fall Risks   Follow up Falls prevention discussed;Education provided;Falls evaluation completed Falls evaluation completed Falls evaluation completed Falls evaluation completed     FALL RISK PREVENTION PERTAINING TO THE HOME:  Any stairs in or around the home? Yes  If so, are there any without handrails? No  Home free of loose throw rugs in walkways, pet beds, electrical cords, etc? Yes  Adequate lighting in your home to reduce risk of falls? Yes   ASSISTIVE DEVICES UTILIZED TO PREVENT FALLS:  Life alert? No  Use of a cane, walker or w/c? No  Grab bars in the bathroom? Yes  Shower chair or bench in shower? Yes  Elevated toilet seat or a handicapped toilet? Yes   TIMED UP AND GO:  Was the test performed? No .      Cognitive Function:        02/07/2023    3:34 PM 01/26/2022    8:58 AM  6CIT Screen  What Year? 0 points 0 points  What month? 0 points 0 points  What time? 0  points 0 points  Count back from 20 0 points 0 points  Months in reverse 0 points 0 points  Repeat phrase 0 points 0 points  Total Score 0 points 0 points    Immunizations Immunization History  Administered Date(s) Administered   COVID-19, mRNA, vaccine(Comirnaty)12 years and older 07/04/2022   DTaP 04/17/1998   Fluad Quad(high Dose 65+) 06/06/2019   Influenza Split 10/06/2011, 07/21/2015   Influenza, High Dose Seasonal PF 07/01/2020   Influenza,inj,Quad PF,6+ Mos 06/05/2017, 06/26/2018   Influenza-Unspecified 06/27/2021, 06/10/2022   PFIZER(Purple Top)SARS-COV-2 Vaccination 10/30/2019, 11/18/2019, 07/04/2020   PPD  Test 07/21/1982   Pneumococcal Conjugate-13 06/20/2019, 06/26/2020   Td 12/08/2019   Tdap 06/30/2009   Zoster Recombinat (Shingrix) 01/08/2020   Zoster, Live 06/04/2012    TDAP status: Up to date  Flu Vaccine status: Up to date  Pneumococcal vaccine status: Up to date  Covid-19 vaccine status: Completed vaccines  Qualifies for Shingles Vaccine? Yes   Zostavax completed Yes   Shingrix Completed?: Yes  Screening Tests Health Maintenance  Topic Date Due   Zoster Vaccines- Shingrix (2 of 2) 03/04/2020   MAMMOGRAM  03/19/2022   Medicare Annual Wellness (AWV)  01/27/2023   Pneumonia Vaccine 45+ Years old (2 of 2 - PPSV23 or PCV20) 01/26/2025 (Originally 06/26/2021)   INFLUENZA VACCINE  05/11/2023   Fecal DNA (Cologuard)  02/08/2025   DTaP/Tdap/Td (4 - Td or Tdap) 12/07/2029   DEXA SCAN  Completed   COVID-19 Vaccine  Completed   Hepatitis C Screening  Completed   HPV VACCINES  Aged Out    Health Maintenance  Health Maintenance Due  Topic Date Due   Zoster Vaccines- Shingrix (2 of 2) 03/04/2020   MAMMOGRAM  03/19/2022   Medicare Annual Wellness (AWV)  01/27/2023    Colorectal cancer screening: Type of screening: Cologuard. Completed 02/08/2022. Repeat every 3 years  Mammogram status: Completed 08/18/2022. Repeat every year  Bone Density status: Completed 12/09/2020.  Lung Cancer Screening: (Low Dose CT Chest recommended if Age 28-80 years, 30 pack-year currently smoking OR have quit w/in 15years.) does not qualify.   Lung Cancer Screening Referral: no  Additional Screening:  Hepatitis C Screening: does qualify; Completed 01/26/2022  Vision Screening: Recommended annual ophthalmology exams for early detection of glaucoma and other disorders of the eye. Is the patient up to date with their annual eye exam?  Yes  Who is the provider or what is the name of the office in which the patient attends annual eye exams? [facility] If pt is not established with a provider, would  they like to be referred to a provider to establish care? No .   Dental Screening: Recommended annual dental exams for proper oral hygiene  Community Resource Referral / Chronic Care Management: CRR required this visit?  No   CCM required this visit?  No      Plan:     I have personally reviewed and noted the following in the patient's chart:   Medical and social history Use of alcohol, tobacco or illicit drugs  Current medications and supplements including opioid prescriptions. Patient is not currently taking opioid prescriptions. Functional ability and status Nutritional status Physical activity Advanced directives List of other physicians Hospitalizations, surgeries, and ER visits in previous 12 months Vitals Screenings to include cognitive, depression, and falls Referrals and appointments  In addition, I have reviewed and discussed with patient certain preventive protocols, quality metrics, and best practice recommendations. A written personalized care plan for preventive services as well  as general preventive health recommendations were provided to patient.     Barb Merino, LPN   11/23/863   Nurse Notes: none  Due to this being a virtual visit, the after visit summary with patients personalized plan was offered to patient via mail or my-chart.  Patient would like to access on my-chart

## 2023-04-10 ENCOUNTER — Other Ambulatory Visit: Payer: Self-pay | Admitting: Internal Medicine

## 2023-05-16 ENCOUNTER — Ambulatory Visit (INDEPENDENT_AMBULATORY_CARE_PROVIDER_SITE_OTHER): Payer: Medicare Other | Admitting: Family Medicine

## 2023-05-16 ENCOUNTER — Encounter: Payer: Self-pay | Admitting: Family Medicine

## 2023-05-16 VITALS — Ht 65.0 in | Wt 126.6 lb

## 2023-05-16 DIAGNOSIS — Z13220 Encounter for screening for lipoid disorders: Secondary | ICD-10-CM

## 2023-05-16 DIAGNOSIS — Z9849 Cataract extraction status, unspecified eye: Secondary | ICD-10-CM | POA: Diagnosis not present

## 2023-05-16 DIAGNOSIS — Z9889 Other specified postprocedural states: Secondary | ICD-10-CM

## 2023-05-16 DIAGNOSIS — Z0000 Encounter for general adult medical examination without abnormal findings: Secondary | ICD-10-CM

## 2023-05-16 DIAGNOSIS — M810 Age-related osteoporosis without current pathological fracture: Secondary | ICD-10-CM

## 2023-05-16 DIAGNOSIS — E042 Nontoxic multinodular goiter: Secondary | ICD-10-CM

## 2023-05-16 DIAGNOSIS — D179 Benign lipomatous neoplasm, unspecified: Secondary | ICD-10-CM

## 2023-05-16 DIAGNOSIS — Z9089 Acquired absence of other organs: Secondary | ICD-10-CM | POA: Diagnosis not present

## 2023-05-16 DIAGNOSIS — J4520 Mild intermittent asthma, uncomplicated: Secondary | ICD-10-CM | POA: Diagnosis not present

## 2023-05-16 DIAGNOSIS — F410 Panic disorder [episodic paroxysmal anxiety] without agoraphobia: Secondary | ICD-10-CM

## 2023-05-16 DIAGNOSIS — M5416 Radiculopathy, lumbar region: Secondary | ICD-10-CM | POA: Diagnosis not present

## 2023-05-16 DIAGNOSIS — E039 Hypothyroidism, unspecified: Secondary | ICD-10-CM

## 2023-05-16 LAB — CBC WITH DIFFERENTIAL/PLATELET
Basophils Absolute: 0.1 10*3/uL (ref 0.0–0.2)
Basos: 2 %
EOS (ABSOLUTE): 0.1 10*3/uL (ref 0.0–0.4)
Eos: 2 %
Hematocrit: 39.7 % (ref 34.0–46.6)
Hemoglobin: 13.7 g/dL (ref 11.1–15.9)
Immature Grans (Abs): 0 10*3/uL (ref 0.0–0.1)
Immature Granulocytes: 0 %
Lymphocytes Absolute: 1.9 10*3/uL (ref 0.7–3.1)
Lymphs: 40 %
MCH: 34.2 pg — ABNORMAL HIGH (ref 26.6–33.0)
MCHC: 34.5 g/dL (ref 31.5–35.7)
MCV: 99 fL — ABNORMAL HIGH (ref 79–97)
Monocytes Absolute: 0.5 10*3/uL (ref 0.1–0.9)
Monocytes: 10 %
Neutrophils Absolute: 2.1 10*3/uL (ref 1.4–7.0)
Neutrophils: 46 %
Platelets: 272 10*3/uL (ref 150–450)
RBC: 4.01 x10E6/uL (ref 3.77–5.28)
RDW: 12.1 % (ref 11.7–15.4)
WBC: 4.6 10*3/uL (ref 3.4–10.8)

## 2023-05-16 LAB — COMPREHENSIVE METABOLIC PANEL

## 2023-05-16 LAB — LIPID PANEL

## 2023-05-16 MED ORDER — ALBUTEROL SULFATE HFA 108 (90 BASE) MCG/ACT IN AERS: INHALATION_SPRAY | RESPIRATORY_TRACT | 0 refills | Status: DC

## 2023-05-16 NOTE — Progress Notes (Signed)
Complete physical exam  Patient: Shannon [name]   DOB: Feb 25, 1951   72 y.o. Female  MRN: [id number]  Subjective:    Chief Complaint  Patient presents with   Annual Exam    Had AWV done 02/07/23.Patient is fasting. No new concerns. Patient is requesting refill on her albuterol.     Shannon [name] is a 72 y.o. female who presents today for a a med check She reports consuming a limited red meat, limited dairy diet. Home exercise routine includes walks 30 minutes daily. She generally feels well. She reports sleeping well. She does not have additional problems to discuss today.  She has had parathyroid surgery and is now also on thyroid medications for treatment of her pulm nodules.  She does have a history of osteoporosis but is not interested in any medication for that citing questions about side effects.  She does have a history of panic attacks these are mainly around driving in the car.  She has had difficulty with lumbar radiculopathy but after she had PRP, at symptom went away.  She does occasionally need albuterol.  Otherwise she has had multiple minor orthopedic issues.   Most recent fall risk assessment:    02/07/2023    3:33 PM  Fall Risk   Falls in the past year? 0  Number falls in past yr: 0  Injury with Fall? 0  Risk for fall due to : Medication side effect  Follow up Falls prevention discussed;Education provided;Falls evaluation completed     Most recent depression screenings:    02/07/2023    3:33 PM 01/26/2022    8:56 AM  PHQ 2/9 Scores  PHQ - 2 Score 0 0  PHQ- 9 Score 0     Vision:Within last year    Patient Care Team: Ronnald Nian, MD as PCP - General (Family Medicine)  Dentist: Dr. Revonda Humphrey Optho: [facility] Surgeon, Cataract: [facility] Dr. Genia Del Cardio: Jacinto Halim Endo: Elvera Lennox Gyn: Belva Agee Sports Med: Dr. Terrilee Files Derm: Roxan Hockey Ortho:Dr. Roda Shutters   Outpatient Medications Prior to Visit  Medication Sig Note   Cholecalciferol 50 MCG  (2000 UT) CAPS Take 2,000 Units by mouth daily.    Coenzyme Q10 (CO Q-10) 100 MG CAPS Take 100 mg by mouth daily.    levothyroxine (SYNTHROID) 88 MCG tablet TAKE 1 TABLET(88 MCG) BY MOUTH DAILY BEFORE BREAKFAST    Magnesium 250 MG TABS Take 250 mg by mouth daily.     vitamin B-12 (CYANOCOBALAMIN) 1000 MCG tablet Take 1,000 mcg by mouth daily.    albuterol (VENTOLIN HFA) 108 (90 Base) MCG/ACT inhaler INHALE 2 PUFFS INTO THE LUNGS EVERY 6 HOURS AS NEEDED FOR WHEEZING OR SHORTNESS OF BREATH (Patient not taking: Reported on 05/16/2023) 05/16/2023: As needed   clindamycin (CLEOCIN) 150 MG capsule clindamycin HCl 150 mg capsule  TAKE 2 CAPSULES BY MOUTH 1 HOUR PRIOR TO APPOINTMENT AND 1 CAPSULE BY MOUTH 6 HRS AFTER 1ST DOSE (Patient not taking: Reported on 01/26/2022)    No facility-administered medications prior to visit.    Review of Systems  All other systems reviewed and are negative.         Objective:     Ht 5\' 5"  (1.651 m)   Wt 126 lb 9.6 oz (57.4 kg)   BMI 21.07 kg/m    Physical Exam   No results found for any visits on 05/16/23.     Assessment & Plan:   H/O parathyroidectomy  Hypothyroidism, unspecified  type  Panic attacks  Status post cataract extraction, unspecified laterality  Multiple thyroid nodules - Plan: CBC with Differential/Platelet, Comprehensive metabolic panel  Osteoporosis without current pathological fracture, unspecified osteoporosis type - Plan: CBC with Differential/Platelet, Comprehensive metabolic panel  Mild intermittent asthma, unspecified whether complicated - Plan: albuterol (VENTOLIN HFA) 108 (90 Base) MCG/ACT inhaler  Lumbar radiculopathy  Encounter for screening for lipid disorder - Plan: Lipid panel  Immunization History  Administered Date(s) Administered   COVID-19, mRNA, vaccine(Comirnaty)12 years and older 07/04/2022   DTaP 04/17/1998   Fluad Quad(high Dose 65+) 06/06/2019   Influenza Split 10/06/2011, 07/21/2015   Influenza,  High Dose Seasonal PF 07/01/2020   Influenza,inj,Quad PF,6+ Mos 06/05/2017, 06/26/2018   Influenza-Unspecified 06/27/2021, 06/10/2022   PFIZER(Purple Top)SARS-COV-2 Vaccination 10/30/2019, 11/18/2019, 07/04/2020   PPD Test 07/21/1982   Pneumococcal Conjugate-13 06/20/2019, 06/26/2020   Td 12/08/2019   Tdap 06/30/2009   Zoster Recombinant(Shingrix) 01/08/2020   Zoster, Live 06/04/2012    Health Maintenance  Topic Date Due   Zoster Vaccines- Shingrix (2 of 2) 03/04/2020   MAMMOGRAM  03/19/2022   COVID-19 Vaccine (5 - 2023-24 season) 11/03/2022   INFLUENZA VACCINE  05/11/2023   Pneumonia Vaccine 63+ Years old (2 of 2 - PPSV23 or PCV20) 01/26/2025 (Originally 06/26/2021)   Medicare Annual Wellness (AWV)  02/07/2024   Fecal DNA (Cologuard)  02/08/2025   DTaP/Tdap/Td (4 - Td or Tdap) 12/07/2029   DEXA SCAN  Completed   Hepatitis C Screening  Completed   HPV VACCINES  Aged Out   I discussed osteoporosis with her and recommend that she reevaluate the information and look at the actual risk versus the benefits.  She will keep me informed as to if she changes her mind.  Otherwise she will continue on her present medication regimen and followed by endocrinology. Discussed health benefits of physical activity, and encouraged her to engage in regular exercise appropriate for her age and condition.         Sharlot Gowda, MD

## 2023-06-21 DIAGNOSIS — Z23 Encounter for immunization: Secondary | ICD-10-CM | POA: Diagnosis not present

## 2023-06-27 DIAGNOSIS — Z23 Encounter for immunization: Secondary | ICD-10-CM | POA: Diagnosis not present

## 2023-07-13 NOTE — Progress Notes (Signed)
Shannon [name] [address] [place] [address] Phone: [phone] Subjective:   [name], am serving as a scribe for Dr. Antoine [name].  I'm seeing this patient by the request  of:  Shannon Nian, MD  CC: Left shoulder pain  KGM:[id number]  03/14/2022 Doing significantly better at this time. This in the left sacroiliac joint. Discussed the possibility of needing the PRP but hopefully will not. We will make a follow-up in 3 weeks just in case.   Shannon [name] is a 72 y.o. female coming in with complaint of L shoulder pain. Has been hurting for several months. Thinks its coming from overuse from yard work. Ice not working like it used to. A little radiating pain that has gotten better. Pain does wake her up at night       Past Medical History:  Diagnosis Date   Allergy    RHINTIS   Asthma    Fibrocystic disease of breast    Hemorrhoids    Hx of colonic polyps    Menopause    Mitral valve prolapse    Mitral valve prolapse    PONV (postoperative nausea and vomiting)    Pt asked to be careful with her teeth, due to them being brittle from low calcium   Primary hyperparathyroidism (HCC)    right   Spinal headache    Past Surgical History:  Procedure Laterality Date   APPENDECTOMY  1964   COLONOSCOPY     CYSTOSTOMY W/ BLADDER BIOPSY     Dr. Earlene Plater   HEMORRHOID SURGERY     Dr. Luan Pulling   NECK EXPLORATION  04/17/2017   with parathyroidectomy   PARATHYROIDECTOMY Right 01/02/2017   Procedure: RIGHT INFERIOR PARATHYROIDECTOMY;  Surgeon: Shannon Level, MD;  Location: [facility] OR;  Service: General;  Laterality: Right;   PARATHYROIDECTOMY N/A 04/17/2017   Procedure: NECK EXPLORATION WITH PARATHYROIDECTOMY;  Surgeon: Shannon Level, MD;  Location: [facility] OR;  Service: General;  Laterality: N/A;   SKIN BIOPSY Right 02/07/2022   nodulocystic fat nerosis   SKIN BIOPSY Right 07/04/2022   seborrheic keratosis and actinic keratosis   THUMB FUSION  1997    Dr Jillyn Hidden   TONSILLECTOMY AND ADENOIDECTOMY     tummy tuck     Social History   Socioeconomic History   Marital status: Married    Spouse name: Not on file   Number of children: 2   Years of education: Not on file   Highest education [name]: Not on file  Occupational History   Occupation: retired  Tobacco Use   Smoking status: Former    Current packs/day: 0.00    Average packs/day: 0.3 packs/day for 10.0 years (2.5 ttl pk-yrs)    Types: Cigarettes    Start date: 06/05/1975    Quit date: 06/04/1985    Years since quitting: 38.1   Smokeless tobacco: Never  Vaping Use   Vaping status: Never Used  Substance and Sexual Activity   Alcohol use: Yes    Alcohol/week: 5.0 standard drinks of alcohol    Types: 5 Glasses of wine per week   Drug use: No   Sexual activity: Yes  Other Topics Concern   Not on file  Social History Narrative   Not on file   Social Determinants of Health   Financial Resource Strain: Low Risk  (02/07/2023)   Overall Financial Resource Strain (CARDIA)    Difficulty of Paying Living Expenses: Not hard at all  Food Insecurity:  No Food Insecurity (02/07/2023)   Hunger Vital Sign    Worried About Running Out of Food in the Last Year: Never true    Ran Out of Food in the Last Year: Never true  Transportation Needs: No Transportation Needs (02/07/2023)   PRAPARE - Administrator, Civil Service (Medical): No    Lack of Transportation (Non-Medical): No  Physical Activity: Sufficiently Active (02/07/2023)   Exercise Vital Sign    Days of Exercise per Week: 7 days    Minutes of Exercise per Session: 30 min  Stress: No Stress Concern Present (02/07/2023)   [employer] of Occupational Health - Occupational Stress Questionnaire    Feeling of Stress : Not at all  Social Connections: Not on file   Allergies  Allergen Reactions   Asa [Aspirin] Other (See Comments)    Minor bleeding   Family History  Problem Relation Age of Onset   Cancer Father  84       Lung cancer   Heart disease Father    Hyperlipidemia Father    Heart disease Mother 21       Sudden death presumed MI   Heart disease Brother 56       Presumed CHF   Heart disease Brother     Current Outpatient Medications (Endocrine & Metabolic):    levothyroxine (SYNTHROID) 88 MCG tablet, TAKE 1 TABLET(88 MCG) BY MOUTH DAILY BEFORE BREAKFAST   Current Outpatient Medications (Respiratory):    albuterol (VENTOLIN HFA) 108 (90 Base) MCG/ACT inhaler, INHALE 2 PUFFS INTO THE LUNGS EVERY 6 HOURS AS NEEDED FOR WHEEZING OR SHORTNESS OF BREATH   Current Outpatient Medications (Hematological):    vitamin B-12 (CYANOCOBALAMIN) 1000 MCG tablet, Take 1,000 mcg by mouth daily.  Current Outpatient Medications (Other):    doxycycline (VIBRA-TABS) 100 MG tablet, Take 1 tablet (100 mg total) by mouth 2 (two) times daily.   Cholecalciferol 50 MCG (2000 UT) CAPS, Take 2,000 Units by mouth daily.   clindamycin (CLEOCIN) 150 MG capsule, clindamycin HCl 150 mg capsule  TAKE 2 CAPSULES BY MOUTH 1 HOUR PRIOR TO APPOINTMENT AND 1 CAPSULE BY MOUTH 6 HRS AFTER 1ST DOSE (Patient not taking: Reported on 01/26/2022)   Coenzyme Q10 (CO Q-10) 100 MG CAPS, Take 100 mg by mouth daily.   Magnesium 250 MG TABS, Take 250 mg by mouth daily.    Reviewed prior external information including notes and imaging from  primary care provider As well as notes that were available from care everywhere and other healthcare systems.  Past medical history, social, surgical and family history all reviewed in electronic medical record.  No pertanent information unless stated regarding to the chief complaint.   Review of Systems:  No headache, visual changes, nausea, vomiting, diarrhea, constipation, dizziness, abdominal pain, skin rash, fevers, chills, night sweats, weight loss, swollen lymph nodes, body aches, joint swelling, chest pain, shortness of breath, mood changes. POSITIVE muscle aches  Objective  Blood pressure  (!) 122/90, pulse 86, height 5\' 5"  (1.651 m), weight 128 lb (58.1 kg), SpO2 95%.   General: No apparent distress alert and oriented x3 mood and affect normal, dressed appropriately.  HEENT: Pupils equal, extraocular movements intact  Respiratory: Patient's speak in full sentences and does not appear short of breath  Cardiovascular: No lower extremity edema, non tender, no erythema  Shoulder: left Inspection reveals no abnormalities, atrophy or asymmetry. Palpation is normal with no tenderness over AC joint or bicipital groove. ROM is full in all planes  passively. Rotator cuff strength normal throughout. signs of impingement with positive Neer and Hawkin's tests, but negative empty can sign. Speeds and Yergason's tests normal. No labral pathology noted with negative Obrien's, negative clunk and good stability. Normal scapular function observed. No painful arc and no drop arm sign. No apprehension sign  MSK US performed of: left This study was ordered, performed, and interpreted by Terrilee Files D.O.  Shoulder:   Supraspinatus:  Appears normal on long and transverse views, Bursal bulge seen with shoulder abduction on impingement view. Infraspinatus:  Appears normal on long and transverse views. Significant increase in Doppler flow Subscapularis:  Appears normal on long and transverse views. Positive bursa Teres Minor:  Appears normal on long and transverse views. AC joint:  Capsule undistended, no geyser sign. Glenohumeral Joint:  Appears normal without effusion. Glenoid Labrum:  Intact without visualized tears. Biceps Tendon:  Appears normal on long and transverse views, no fraying of tendon, tendon located in intertubercular groove, no subluxation with shoulder internal or external rotation.  Impression: Subacromial bursitis  Procedure: Real-time Ultrasound Guided Injection of left glenohumeral joint Device: GE Logiq E  Ultrasound guided injection is preferred based studies that show  increased duration, increased effect, greater accuracy, decreased procedural pain, increased response rate with ultrasound guided versus blind injection.  Verbal informed consent obtained.  Time-out conducted.  Noted no overlying erythema, induration, or other signs of local infection.  Skin prepped in a sterile fashion.  Local anesthesia: Topical Ethyl chloride.  With sterile technique and under real time ultrasound guidance:  Joint visualized.  23g 1  inch needle inserted posterior approach. Pictures taken for needle placement. Patient did have injection of, 2 cc of 0.5% Marcaine, and aspirated 30 cc of sterile light-colored fluid 1.0 cc of Kenalog 40 mg/dL. Completed without difficulty  Pain immediately resolved suggesting accurate placement of the medication.  Advised to call if fevers/chills, erythema, induration, drainage, or persistent bleeding.  Impression: Technically successful ultrasound guided injection.  Procedure: Real-time Ultrasound Guided Injection of left acromioclavicular joint Device: GE Logiq Q7 Ultrasound guided injection is preferred based studies that show increased duration, increased effect, greater accuracy, decreased procedural pain, increased response rate, and decreased cost with ultrasound guided versus blind injection.  Verbal informed consent obtained.  Time-out conducted.  Noted no overlying erythema, induration, or other signs of local infection.  Skin prepped in a sterile fashion.  Local anesthesia: Topical Ethyl chloride.  With sterile technique and under real time ultrasound guidance: With a 25-gauge half inch needle injected with 0.5 cc of 0.5% Marcaine and 0.5 cc of Kenalog 40 mg/mL Completed without difficulty  Pain immediately resolved suggesting accurate placement of the medication.  Advised to call if fevers/chills, erythema, induration, drainage, or persistent bleeding.  Impression: Technically successful ultrasound guided injection.     Impression and Recommendations:     The above documentation has been reviewed and is accurate and complete Judi Saa, DO

## 2023-07-14 ENCOUNTER — Encounter: Payer: Self-pay | Admitting: Family Medicine

## 2023-07-14 ENCOUNTER — Other Ambulatory Visit: Payer: Self-pay

## 2023-07-14 ENCOUNTER — Ambulatory Visit (INDEPENDENT_AMBULATORY_CARE_PROVIDER_SITE_OTHER): Payer: Medicare Other | Admitting: Family Medicine

## 2023-07-14 VITALS — BP 122/90 | HR 86 | Ht 65.0 in | Wt 128.0 lb

## 2023-07-14 DIAGNOSIS — M25512 Pain in left shoulder: Secondary | ICD-10-CM | POA: Diagnosis not present

## 2023-07-14 DIAGNOSIS — G8929 Other chronic pain: Secondary | ICD-10-CM | POA: Diagnosis not present

## 2023-07-14 DIAGNOSIS — M7552 Bursitis of left shoulder: Secondary | ICD-10-CM

## 2023-07-14 MED ORDER — DOXYCYCLINE HYCLATE 100 MG PO TABS: 100.0000 mg | ORAL_TABLET | Freq: Two times a day (BID) | ORAL | 0 refills | Status: DC

## 2023-07-14 NOTE — Patient Instructions (Addendum)
Injection in [facility] joint today Drained shoulder today Doxycycline for 7 days See you again in 2 months

## 2023-07-14 NOTE — Assessment & Plan Note (Signed)
Repeat injection on the left side given today.  Tolerated the procedure well.  Chronic problem with exacerbation noted.  Discussed which activities to do and which ones to avoid.  Increase activity slowly.  Discussed icing regimen.  Follow-up with me again in 6 to 8 weeks otherwise.

## 2023-07-14 NOTE — Assessment & Plan Note (Addendum)
Significant bursitis of the shoulder noted.  Did have aspiration of the shoulder done.  Responded extremely well to the aspiration.  Increase activity slowly otherwise.  Follow-up again in 6 to 8 weeks.  Continuing to have difficulty or any increasing weakness we would need to consider advanced imaging again.  Will give him doxycycline secondary to going into the weekend and wants to make sure there is no treatments for any type of difficulty.

## 2023-07-26 DIAGNOSIS — L538 Other specified erythematous conditions: Secondary | ICD-10-CM | POA: Diagnosis not present

## 2023-07-26 DIAGNOSIS — L820 Inflamed seborrheic keratosis: Secondary | ICD-10-CM | POA: Diagnosis not present

## 2023-07-26 DIAGNOSIS — L814 Other melanin hyperpigmentation: Secondary | ICD-10-CM | POA: Diagnosis not present

## 2023-07-26 DIAGNOSIS — D485 Neoplasm of uncertain behavior of skin: Secondary | ICD-10-CM | POA: Diagnosis not present

## 2023-07-26 DIAGNOSIS — D0472 Carcinoma in situ of skin of left lower limb, including hip: Secondary | ICD-10-CM | POA: Diagnosis not present

## 2023-07-26 DIAGNOSIS — L570 Actinic keratosis: Secondary | ICD-10-CM | POA: Diagnosis not present

## 2023-07-26 DIAGNOSIS — L821 Other seborrheic keratosis: Secondary | ICD-10-CM | POA: Diagnosis not present

## 2023-07-31 ENCOUNTER — Ambulatory Visit: Payer: BLUE CROSS/BLUE SHIELD | Admitting: Family Medicine

## 2023-08-09 DIAGNOSIS — D0472 Carcinoma in situ of skin of left lower limb, including hip: Secondary | ICD-10-CM | POA: Diagnosis not present

## 2023-08-22 DIAGNOSIS — Z1231 Encounter for screening mammogram for malignant neoplasm of breast: Secondary | ICD-10-CM | POA: Diagnosis not present

## 2023-08-22 LAB — HM MAMMOGRAPHY

## 2023-08-31 DIAGNOSIS — L814 Other melanin hyperpigmentation: Secondary | ICD-10-CM | POA: Diagnosis not present

## 2023-08-31 DIAGNOSIS — Z08 Encounter for follow-up examination after completed treatment for malignant neoplasm: Secondary | ICD-10-CM | POA: Diagnosis not present

## 2023-08-31 DIAGNOSIS — L821 Other seborrheic keratosis: Secondary | ICD-10-CM | POA: Diagnosis not present

## 2023-08-31 DIAGNOSIS — L820 Inflamed seborrheic keratosis: Secondary | ICD-10-CM | POA: Diagnosis not present

## 2023-08-31 DIAGNOSIS — Z86007 Personal history of in-situ neoplasm of skin: Secondary | ICD-10-CM | POA: Diagnosis not present

## 2023-08-31 DIAGNOSIS — L570 Actinic keratosis: Secondary | ICD-10-CM | POA: Diagnosis not present

## 2023-08-31 DIAGNOSIS — L538 Other specified erythematous conditions: Secondary | ICD-10-CM | POA: Diagnosis not present

## 2023-08-31 DIAGNOSIS — D225 Melanocytic nevi of trunk: Secondary | ICD-10-CM | POA: Diagnosis not present

## 2023-09-13 ENCOUNTER — Ambulatory Visit: Payer: BLUE CROSS/BLUE SHIELD | Admitting: Family Medicine

## 2023-10-06 ENCOUNTER — Other Ambulatory Visit: Payer: Self-pay | Admitting: Family Medicine

## 2023-10-06 DIAGNOSIS — J4520 Mild intermittent asthma, uncomplicated: Secondary | ICD-10-CM

## 2023-10-31 ENCOUNTER — Ambulatory Visit: Payer: BLUE CROSS/BLUE SHIELD | Admitting: Family Medicine

## 2023-11-15 ENCOUNTER — Ambulatory Visit: Payer: BLUE CROSS/BLUE SHIELD | Admitting: Family Medicine

## 2023-12-05 ENCOUNTER — Encounter: Payer: Self-pay | Admitting: Internal Medicine

## 2024-01-29 ENCOUNTER — Other Ambulatory Visit: Payer: Self-pay

## 2024-01-29 ENCOUNTER — Ambulatory Visit (INDEPENDENT_AMBULATORY_CARE_PROVIDER_SITE_OTHER): Admitting: Sports Medicine

## 2024-01-29 VITALS — BP 118/76 | HR 68 | Ht 65.0 in | Wt 127.0 lb

## 2024-01-29 DIAGNOSIS — G8929 Other chronic pain: Secondary | ICD-10-CM | POA: Diagnosis not present

## 2024-01-29 DIAGNOSIS — M533 Sacrococcygeal disorders, not elsewhere classified: Secondary | ICD-10-CM | POA: Diagnosis not present

## 2024-01-29 DIAGNOSIS — M545 Low back pain, unspecified: Secondary | ICD-10-CM

## 2024-01-29 NOTE — Patient Instructions (Addendum)
 Low back  Keep appointment with Dr. Felipe Horton  Tylenol  as needed for back pain

## 2024-01-29 NOTE — Progress Notes (Signed)
 Ben Mel Langan D.[facility] [address] [place] [address] Phone: [phone]   Assessment and Plan:    1. Chronic right-sided low back pain without sciatica 2. Chronic right SI joint pain  -Chronic with exacerbation, initial visit - Recurrence in low back pain.  Patient's low back pain has typically been left-sided, however current flare x 3 days is right-sided lower lumbar spine/SI joint - Discussed medication therapy versus CSI.  Patient prefers to not use medications if possible, so elected for right-sided SI joint CSI.  We discussed that if patient's primary pain generator is her SI joint, the injection worked well, however if pain is primarily stemming from right lower lumbar degenerative changes, she will see limited relief.  Patient verbalized understanding and tolerated procedure well per note below - May use Tylenol  for day-to-day pain relief - Start HEP for low back  Procedure: Ultrasound Guided Sacroiliac Joint Injection  Side: Right Diagnosis: Right SI joint pain US  Indication:  - accuracy is paramount for diagnosis - to ensure therapeutic efficacy or procedural success - to reduce procedural risk  After explaining the procedure, viable alternatives, risks, and answering any questions, consent was given verbally. The site was cleaned with chlorhexidine  prep. An ultrasound transducer was placed on the lumbosacral spine.  The spinous processes were identified. These were followed down from the lumbar spine to the scarum and the SI joint was identified as were the neural foramen.  A needle was introduced with care taken to avoid the neural foramen under ultrasound guidance into the SI joint with sterile technique.    A steroid injection was performed using 2ml of 1% lidocaine  without epinephrine and 40 mg of triamcinolone  (KENALOG ) 40mg /ml. This was well tolerated and resulted in  relief.  Needle was removed and dressing placed and post  injection instructions were given including  a discussion of likely return of pain today after the anesthetic wears off (with the possibility of worsened pain) until the steroid starts to work in 1-3 days.   Pt was advised to call or return to clinic if these symptoms worsen or fail to improve as anticipated.   Pertinent previous records reviewed include lumbar MRI 07/05/2021  Follow Up: Patient has follow-up visit scheduled in 8 days.  May keep this visit.  If no improvement or worsening of symptoms, would consider lumbar origin of patient's pain and could discuss medication courses versus physical therapy versus epidural   Subjective:   I, Moenique Parris, am serving as a Neurosurgeon for Doctor Ulysees Gander  Chief Complaint: low back pain   HPI:   01/29/24 Patient is a 73 year old female with low back pain. Patient states right sided low back pain for about 2 weeks. No meds for the pain. Ice and heat haven't really helped. Thinks she over did it with cleaning and gardening. No radiating pain. No numbness or tingling. Pain with sitting and standing. She isnt able to sleep through the night . Decreased ROM with legs.    Relevant Historical Information: Hypothyroidism  Additional pertinent review of systems negative.   Current Outpatient Medications:    albuterol  (VENTOLIN  HFA) 108 (90 Base) MCG/ACT inhaler, INHALE 2 PUFFS INTO THE LUNGS EVERY 6 HOURS AS NEEDED FOR WHEEZING OR SHORTNESS OF BREATH, Disp: 6.7 g, Rfl: 1   Cholecalciferol 50 MCG (2000 UT) CAPS, Take 2,000 Units by mouth daily., Disp: , Rfl:    clindamycin (CLEOCIN) 150 MG capsule, , Disp: , Rfl:  Coenzyme Q10 (CO Q-10) 100 MG CAPS, Take 100 mg by mouth daily., Disp: , Rfl:    levothyroxine  (SYNTHROID ) 88 MCG tablet, TAKE 1 TABLET(88 MCG) BY MOUTH DAILY BEFORE BREAKFAST, Disp: 90 tablet, Rfl: 3   Magnesium 250 MG TABS, Take 250 mg by mouth daily. , Disp: , Rfl:    vitamin B-12 (CYANOCOBALAMIN) 1000 MCG tablet, Take 1,000 mcg  by mouth daily., Disp: , Rfl:    doxycycline  (VIBRA -TABS) 100 MG tablet, Take 1 tablet (100 mg total) by mouth 2 (two) times daily., Disp: 14 tablet, Rfl: 0   Objective:     Vitals:   01/29/24 1304  BP: 118/76  Pulse: 68  SpO2: 98%  Weight: 127 lb (57.6 kg)  Height: 5\' 5"  (1.651 m)      Body mass index is 21.13 kg/m.    Physical Exam:    Gen: Appears well, nad, nontoxic and pleasant Psych: Alert and oriented, appropriate mood and affect Neuro: sensation intact, strength is 5/5 in upper and lower extremities, muscle tone wnl Skin: no susupicious lesions or rashes  Back - Normal skin, Spine with normal alignment and no deformity.   No tenderness to vertebral process palpation.   Right lower lumbar paraspinous muscles are   tender and without spasm TTP right SI joint,  NTTP gluteal musculature Straight leg raise negative Trendelenberg negative Piriformis Test negative Gait normal  Right-sided low back pain with lumbar flexion and extension.  No pain with rotation  Electronically signed by:  Marshall Skeeter D.[facility] 1:37 PM 01/29/24

## 2024-01-30 NOTE — Progress Notes (Unsigned)
 Patient ID: Shannon [name], female   DOB: 11/04/1950, 73 y.o.   MRN: [id number]   HPI  Shannon [name] is a 73 y.o.-year-old female, initially referred by her PCP, Dr. Robina Chol, returning for f/u for H/o Primary hyperparathyroidism, OP, multi-nodular thyroid . Last visit 1 year ago.  Interim history: She still has back pain.  Previously on epidural steroid injections, which did not help.  She got an injection with platelet rich plasma, which helped significantly.  Previously staying active and exercising (walking) for 30 min a day, push ups, weights but limiting these at last visit due to back pain.  She was able to restart exercising the last year, but recently back pain worsened after cleaning the house >> had a steroid inj 2 days ago. No fractures or falls since last visit.   No disequilibrium/orthostasis/vertigo/poor vision.  Reviewed and addended hx:  Pt has had hypercalcemia at least since 2012.  I have reviewed pertinent labs:  Lab Results  Component Value Date   CAION 4.9 01/27/2023   CAION 4.7 12/24/2021   Lab Results  Component Value Date   PTH 33 06/05/2017   PTH Comment 06/05/2017   PTH 118 (H) 02/03/2017   PTH Comment 02/03/2017   PTH 127 (H) 10/07/2016   PTH 169 (H) 09/07/2016   CALCIUM 9.6 05/16/2023   CALCIUM 10.0 01/26/2022   CALCIUM 9.7 11/26/2020   CALCIUM 9.8 08/13/2019   CALCIUM 9.5 03/05/2019   CALCIUM 9.5 05/31/2018   CALCIUM 10.0 06/05/2017   CALCIUM 8.8 (L) 04/18/2017   CALCIUM 10.8 (H) 04/11/2017   CALCIUM 11.1 (H) 02/03/2017  08/13/2019: Calcium 9.8 02/01/2018: Calcium 9.4, PTH 41 -per checked by Dr. Sofia Dunn  Further labs confirm primary hyperparathyroidism (please see my previous visit notes).  11/22/2016: Technetium sestamibi scan: 1. No evidence of ectopic parathyroid  adenoma. 2. Asymmetric nodular activity in the left peritracheal region on the delayed images could reflect asymmetric thyroid  activity or a left-sided parathyroid   adenoma.  11/30/2016: Neck ultrasound: There are 2 thyroid  nodules as described. - The left mid thyroid  nodule measures 2.7 x 2.2 x 1.2 cm solid/almost completely solid (2), isoechoic (1), and meets fine-needle aspiration criteria.  - The other nodule measures 0.9 cm x 0.7 x 0.6 cm,  solid/almost completely solid (2), hyperechoic (1), taller-than-wide (3), but not meeting criteria for biopsy. - There are no obvious masses external to the thyroid  gland to suggest parathyroid  adenoma.  12/09/2016: 4D CT: 1. 13 mm mass posterior and inferior to the right thyroid  lobe suspicious for parathyroid  adenoma. 2. Additional subcentimeter soft tissue nodules more inferiorly in the right neck, indeterminate. These may reflect small lymph nodes, however an additional parathyroid  adenoma is not excluded (particularly the most inferior nodule just above the thoracic inlet). 3. 2.2 cm left thyroid  nodule as described on ultrasound.  12/14/2016: Thyroid  nodule FNA: benign  She had parathyroidectomy by Dr. Sofia Dunn - 01/02/2017.  1.29 g R parathyroid  adenoma.  She initially felt well after the surgery, but then started to be fatigued again.  Calcium remains high after the surgery: 11.9 (01/19/2017), while PTH decreased to 76 at follow-up with Dr. Sofia Dunn 2 weeks postop.  However, it then increased to 118.  She had a second intervention: Parathyroidectomy in 04/17/2017 >> a second parathyroid  adenoma was resected: Parathyroid  gland, Left - PARATHYROID  ADENOMA.  Her calcium was 8.8 first day postop, then 04/27/2017: Ca 9.2, PTH was 61. She feels great after the surgery.  She feels great after the surgery.  Reviewed thyroid   ultrasounds reports: Thyroid  U/S (02/12/2018):  Left mid nodule 2 measures 2.4 x 2.3 x 1.4 cm and previously measured 2.7 x 2.2 x 1.2 cm. Biopsy was performed 12/14/2016. Right lobe nodule measures 0.8 cm and previously measured 0.9 cm. It does not meet criteria for biopsy nor  follow-up. IMPRESSION: Left nodule 2 is stable and previously underwent biopsy on 12/14/2016.  Thyroid  ultrasound (01/05/2021): Parenchymal Echotexture: Moderately heterogeneous Isthmus: 0.2 cm Right lobe: 3.5 x 1.7 x 1.6 cm Left lobe: 3.5 x 2.0 x 1.8 cm ______________________________________________________   Nodule 1: 0.6 cm isoechoic solid nodule in the mid thyroid  lobe is not significantly changed in size since prior exam and does not meet criteria for FNA or imaging follow-up. _________________________________________________________   Nodule 2: 2.3 x 2.0 x 1.3 cm solid isoechoic nodule located in the mid left thyroid  lobe is not significantly changed in size compared to prior examination where it measured 2.4 x 2.3 x 1.4 cm. Prior FNA of this nodule was performed on 12/14/2016. Please correlate with results.   IMPRESSION: Previously biopsied left mid thyroid  nodule measuring 2.3 x 2.0 x 1.3 cm is not significantly changed in size.  Hypothyroidism:  Pt is on levothyroxine  88 mcg daily, taken: - in am (1-2 am) - fasting - at least 2h from b'fast - no Ca, Fe, MVI, PPIs - not on Biotin On B12, D3, Mg.  Reviewed her TFTs: Lab Results  Component Value Date   TSH 2.21 01/27/2023   TSH 1.27 12/24/2021   TSH 2.760 11/26/2020   TSH 1.69 12/24/2019   TSH 2.270 08/13/2019   TSH 6.30 (H) 06/06/2019   TSH 9.88 (H) 04/11/2019   TSH 16.35 (H) 03/05/2019   TSH 6.57 (H) 03/02/2018  ? 01/2018: TSH 8.98 Lab Results  Component Value Date   FREET4 1.3 01/27/2023   FREET4 1.10 12/24/2021   FREET4 1.17 12/24/2019   FREET4 0.97 06/06/2019   FREET4 0.66 04/11/2019   FREET4 0.57 (L) 03/05/2019   FREET4 0.68 03/02/2018   Lab Results  Component Value Date   T3FREE 3.0 03/05/2019   T3FREE 3.2 03/02/2018   Osteoporosis:  Reviewed available DXA scan reports: Date L1-L4 T score FN T score  12/09/2020 ([facility]) -3.0 RFN n/a LFN -3.0   Date L1-L4 T score FN T score   05/27/2016 ([facility]) -3.6 RFN -2.9 LFN -3.2   Date L1-L4 T score FN T score  05/17/2012  ([facility]) -3.1  RFN -2.7 LFN -3.0    She refuse DXA scans and antiresorptive medicine.  She has a history of left thumb fracture in 2001 while playing tennis. She had several falls playing sports or missing a step >> no fxs.  No history of kidney stones.  No CKD. Last BUN/Cr: Lab Results  Component Value Date   BUN 7 (L) 05/16/2023   BUN 9 01/26/2022   CREATININE 0.63 05/16/2023   CREATININE 0.64 01/26/2022   No history of vitamin D  deficiency: Lab Results  Component Value Date   VD25OH 54 01/27/2023   VD25OH 46.27 12/24/2021   VD25OH 40.9 12/24/2020   VD25OH 45.55 12/24/2019   VD25OH 46.31 03/05/2019   VD25OH 41.58 03/02/2018   VD25OH 32.31 02/03/2017   VD25OH 35 08/24/2016   VD25OH 31 06/04/2012   She continues on 2000 units vitamin D  daily.  She is not eating dairy.  Her son is allergic to dairy.  Pt does not have a FH of hypercalcemia, pituitary tumors, thyroid  cancer.   She  continues intermittent fasting and is a vegetarian.  ROS: + see HPI  I reviewed pt's medications, allergies, PMH, social hx, family hx, and changes were documented in the history of present illness. Otherwise, unchanged from my initial visit note.  Past Medical History:  Diagnosis Date   Allergy    RHINTIS   Asthma    Fibrocystic disease of breast    Hemorrhoids    Hx of colonic polyps    Menopause    Mitral valve prolapse    Mitral valve prolapse    PONV (postoperative nausea and vomiting)    Pt asked to be careful with her teeth, due to them being brittle from low calcium   Primary hyperparathyroidism (HCC)    right   Spinal headache    Past Surgical History:  Procedure Laterality Date   APPENDECTOMY  1964   COLONOSCOPY     CYSTOSTOMY W/ BLADDER BIOPSY     Dr. Nolon Baxter   HEMORRHOID SURGERY     Dr. Laraine Plate   NECK EXPLORATION  04/17/2017   with  parathyroidectomy   PARATHYROIDECTOMY Right 01/02/2017   Procedure: RIGHT INFERIOR PARATHYROIDECTOMY;  Surgeon: Oralee Billow, MD;  [facility];  Service: General;  Laterality: Right;   PARATHYROIDECTOMY N/A 04/17/2017   Procedure: NECK EXPLORATION WITH PARATHYROIDECTOMY;  Surgeon: Oralee Billow, MD;  Location: [facility] OR;  Service: General;  Laterality: N/A;   SKIN BIOPSY Right 02/07/2022   nodulocystic fat nerosis   SKIN BIOPSY Right 07/04/2022   seborrheic keratosis and actinic keratosis   THUMB FUSION  1997   Dr Mildred All   TONSILLECTOMY AND ADENOIDECTOMY     tummy tuck     Social History   Social History   Marital status: Married    Spouse name: N/A   Number of children: 2   Occupational History   retired    Social History Main Topics   Smoking status: Former Smoker    Packs/day: 0.25    Years: 10.00    Types: Cigarettes    Quit date: 06/04/1985   Smokeless tobacco: Never Used   Alcohol use 3.0 oz/week    5 Glasses of wine per week   Drug use: No   Current Outpatient Medications on File Prior to Visit  Medication Sig Dispense Refill   albuterol  (VENTOLIN  HFA) 108 (90 Base) MCG/ACT inhaler INHALE 2 PUFFS INTO THE LUNGS EVERY 6 HOURS AS NEEDED FOR WHEEZING OR SHORTNESS OF BREATH 6.7 g 1   Cholecalciferol 50 MCG (2000 UT) CAPS Take 2,000 Units by mouth daily.     clindamycin (CLEOCIN) 150 MG capsule      Coenzyme Q10 (CO Q-10) 100 MG CAPS Take 100 mg by mouth daily.     doxycycline  (VIBRA -TABS) 100 MG tablet Take 1 tablet (100 mg total) by mouth 2 (two) times daily. 14 tablet 0   levothyroxine  (SYNTHROID ) 88 MCG tablet TAKE 1 TABLET(88 MCG) BY MOUTH DAILY BEFORE BREAKFAST 90 tablet 3   Magnesium 250 MG TABS Take 250 mg by mouth daily.      vitamin B-12 (CYANOCOBALAMIN) 1000 MCG tablet Take 1,000 mcg by mouth daily.     No current facility-administered medications on file prior to visit.   Allergies  Allergen Reactions   Asa [Aspirin] Other (See Comments)    Minor bleeding    Family History  Problem Relation Age of Onset   Cancer Father 17       Lung cancer   Heart disease Father    Hyperlipidemia Father  Heart disease Mother 10       Sudden death presumed MI   Heart disease Brother 58       Presumed CHF   Heart disease Brother    Significant FH of early death 2/2 AMIs in parents and brothers.  PE: BP 116/82 (BP Location: Right Arm, Patient Position: Sitting, Cuff Size: Normal)   Pulse 80   Resp 20   Ht 5\' 5"  (1.651 m)   Wt 130 lb 3.2 oz (59.1 kg)   SpO2 98%   BMI 21.67 kg/m  Wt Readings from Last 3 Encounters:  01/31/24 130 lb 3.2 oz (59.1 kg)  01/29/24 127 lb (57.6 kg)  07/14/23 128 lb (58.1 kg)   Constitutional: Normal weight, in NAD Eyes: EOMI, no exophthalmos ENT: no thyromegaly, left lower cervical fullness; no cervical lymphadenopathy Cardiovascular: RRR, No MRG Respiratory: CTA B Musculoskeletal: no deformities Skin: no rashes Neurological: no tremor with outstretched hands  Assessment: 1. Primary hyperparathyroidism  2. Thyroid  nodules  3.  Subclinical hypothyroidism  4. OP  Plan: 1. Patient with history of primary hyperparathyroidism, with highest calcium level being 12.1 and highest PTH level being 169.  Several investigations confirmed primary hyperparathyroidism and she was referred to surgery.  She had a right parathyroid  adenoma resected in 12/2017, however, unfortunately, calcium and PTH remain high after the surgery.  She had a second, left, parathyroid  adenoma resected in 04/2017 by Dr. Sofia Dunn.  After the surgery, her calcium and PTH levels decreased and she started to feel great.  She had more energy and less anxiety. - She continues on 2000 units vitamin D  daily.  Latest level was normal on this dose.  We will repeat this at next blood draw. - at last visit, an ionized calcium was normal - Latest calcium level was normal: Lab Results  Component Value Date   CALCIUM 9.6 05/16/2023   PHOS 2.3 10/07/2016  -  Will check her calcium again at next visit - RTC in 1 year  2. Thyroid  nodules -She denies neck compression symptoms -In the past, she felt that her left thyroid  nodule was fluctuating in size, and I suspect this may be due to possibly inflammation or fluid accumulation.  Of note, the nodule measured 2.4 cm and the biopsy was benign -Per review of the most recent thyroid  ultrasound report from 2022, the dominant nodule is still large, but not significantly changed or even slightly smaller than before.   - No intervention needed for now; will continue to follow her clinically  3.  Subclinical hypothyroidism - latest thyroid  labs reviewed with pt. >> normal: Lab Results  Component Value Date   TSH 2.21 01/27/2023  - she continues on LT4 88 mcg daily - pt feels good on this dose. - we discussed about taking the thyroid  hormone every day, with water, >30 minutes before breakfast, separated by >4 hours from acid reflux medications, calcium, iron, multivitamins. Pt. is taking it correctly. - will check thyroid  tests in approximately 1 month, since she just got a steroid injection 2 days ago.  4. OP  - No falls or fractures since last visit -Reviewing her bone density report from 2017, her T-scores were worse.  She had another DEXA scan in 12/2020, however, on a different machine, and T-scores were slightly better - At last visit, she was due for another DXA scan but she declined.  At today's visit, she again declines getting more bone density scans. - She also refuses antiresorptive medications and tries to  manage osteoporosis naturally.  She was planning to restart exercising at last visit, as she previously reduced exercise due to back pain -She continues to refuse antiresorptive medications and try to manage osteoporosis naturally.  At last visit, she was not exercising much due to back pain, but was planning to restart.  She did restart since last visit.  She had more back pain after cleaning  her house recently and got a steroid injection, but now she feels much better.  Orders Placed This Encounter  Procedures   TSH   T4, free   VITAMIN D  25 Hydroxy (Vit-D Deficiency, Fractures)   Emilie Harden, MD PhD [facility] Endocrin[facility]

## 2024-01-31 ENCOUNTER — Encounter: Payer: Self-pay | Admitting: Internal Medicine

## 2024-01-31 ENCOUNTER — Ambulatory Visit (INDEPENDENT_AMBULATORY_CARE_PROVIDER_SITE_OTHER): Payer: BLUE CROSS/BLUE SHIELD | Admitting: Internal Medicine

## 2024-01-31 VITALS — BP 116/82 | HR 80 | Resp 20 | Ht 65.0 in | Wt 130.2 lb

## 2024-01-31 DIAGNOSIS — E042 Nontoxic multinodular goiter: Secondary | ICD-10-CM | POA: Diagnosis not present

## 2024-01-31 DIAGNOSIS — E210 Primary hyperparathyroidism: Secondary | ICD-10-CM | POA: Diagnosis not present

## 2024-01-31 DIAGNOSIS — E039 Hypothyroidism, unspecified: Secondary | ICD-10-CM

## 2024-01-31 DIAGNOSIS — M810 Age-related osteoporosis without current pathological fracture: Secondary | ICD-10-CM

## 2024-01-31 NOTE — Patient Instructions (Addendum)
 Please come back for labs in June.  Please continue vitamin D  2000 units daily.  Please continue levothyroxine  88 mcg daily.  Take the thyroid  hormone every day, with water, at least 30 minutes before breakfast, separated by at least 4 hours from: - acid reflux medications - calcium - iron - multivitamins  Please come back for a follow-up appointment in 1 year.

## 2024-02-13 ENCOUNTER — Ambulatory Visit: Payer: Medicare Other

## 2024-02-13 DIAGNOSIS — Z0000 Encounter for general adult medical examination without abnormal findings: Secondary | ICD-10-CM

## 2024-02-13 NOTE — Patient Instructions (Signed)
 Shannon [name] , Thank you for taking time to come for your Medicare Wellness Visit. I appreciate your ongoing commitment to your health goals. Please review the following plan we discussed and let me know if I can assist you in the future.   Referrals/Orders/Follow-Ups/Clinician Recommendations: none  This is a list of the screening recommended for you and due dates:  Health Maintenance  Topic Date Due   Zoster (Shingles) Vaccine (2 of 2) 03/04/2020   Mammogram  03/19/2022   COVID-19 Vaccine (6 - 2024-25 season) 12/25/2023   Pneumonia Vaccine (2 of 2 - PPSV23) 01/26/2025*   Flu Shot  05/10/2024   Cologuard (Stool DNA test)  02/08/2025   Medicare Annual Wellness Visit  02/12/2025   DTaP/Tdap/Td vaccine (4 - Td or Tdap) 12/07/2029   DEXA scan (bone density measurement)  Completed   Hepatitis C Screening  Completed   HPV Vaccine  Aged Out   Meningitis B Vaccine  Aged Out  *Topic was postponed. The date shown is not the original due date.    Advanced directives: (Copy Requested) Please bring a copy of your health care power of attorney and living will to the office to be added to your chart at your convenience. You can mail to [facility] [address] Shannon [name], [place] [address] or email to [email or web address]  Next Medicare Annual Wellness Visit scheduled for next year: Yes  Have you seen your provider in the last 6 months (3 months if uncontrolled diabetes)? Yes, has appointment 05/29/2024  insert Preventive Care attachment Insert FALL PREVENTION attachment if needed

## 2024-02-13 NOTE — Progress Notes (Signed)
 Subjective:   Shannon [name] is a 73 y.o. who presents for a Medicare Wellness preventive visit.  Visit Complete: Virtual I connected with  Shannon [name] on 02/13/24 by a audio enabled telemedicine application and verified that I am speaking with the correct person using two identifiers.  Patient Location: Home  Provider Location: Office/Clinic  I discussed the limitations of evaluation and management by telemedicine. The patient expressed understanding and agreed to proceed.  Vital Signs: Because this visit was a virtual/telehealth visit, some criteria may be missing or patient reported. Any vitals not documented were not able to be obtained and vitals that have been documented are patient reported.  VideoError- Librarian, academic were attempted between this provider and patient, however failed, due to patient having technical difficulties OR patient did not have access to video capability.  We continued and completed visit with audio only.   Persons Participating in Visit: Patient.  AWV Questionnaire: No: Patient Medicare AWV questionnaire was not completed prior to this visit.  Cardiac Risk Factors include: advanced age (>67men, >21 women)     Objective:    Today's Vitals   There is no height or weight on file to calculate BMI.     02/13/2024    3:32 PM 02/07/2023    3:32 PM 01/26/2022    8:55 AM 11/26/2020    9:47 AM 04/17/2017    4:10 PM 04/17/2017    9:40 AM 04/11/2017   12:47 PM  Advanced Directives  Does Patient Have a Medical Advance Directive? Yes Yes Yes Yes  Yes Yes  Type of Estate agent of [place];Living will Healthcare Power of [place];Living will Living will;Healthcare Power of Attorney Living will;Healthcare Power of [employer] Power of [place];Living will Healthcare Power of [place];Living will Healthcare Power of [place];Living will  Does patient want to make changes to medical advance directive?    No -  Patient declined No - Patient declined  No - Patient declined  Copy of Healthcare Power of Attorney in Chart? No - copy requested No - copy requested No - copy requested No - copy requested No - copy requested No - copy requested No - copy requested    Current Medications (verified) Outpatient Encounter Medications as of 02/13/2024  Medication Sig   albuterol  (VENTOLIN  HFA) 108 (90 Base) MCG/ACT inhaler INHALE 2 PUFFS INTO THE LUNGS EVERY 6 HOURS AS NEEDED FOR WHEEZING OR SHORTNESS OF BREATH   Cholecalciferol 50 MCG (2000 UT) CAPS Take 2,000 Units by mouth daily.   Coenzyme Q10 (CO Q-10) 100 MG CAPS Take 100 mg by mouth daily.   levothyroxine  (SYNTHROID ) 88 MCG tablet TAKE 1 TABLET(88 MCG) BY MOUTH DAILY BEFORE BREAKFAST   Magnesium 250 MG TABS Take 250 mg by mouth daily.    vitamin B-12 (CYANOCOBALAMIN) 1000 MCG tablet Take 1,000 mcg by mouth daily.   clindamycin (CLEOCIN) 150 MG capsule  (Patient not taking: Reported on 02/13/2024)   No facility-administered encounter medications on file as of 02/13/2024.    Allergies (verified) Asa [aspirin]   History: Past Medical History:  Diagnosis Date   Allergy    RHINTIS   Asthma    Fibrocystic disease of breast    Hemorrhoids    Hx of colonic polyps    Menopause    Mitral valve prolapse    Mitral valve prolapse    PONV (postoperative nausea and vomiting)    Pt asked to be careful with her teeth, due to them being  brittle from low calcium   Primary hyperparathyroidism (HCC)    right   Spinal headache    Past Surgical History:  Procedure Laterality Date   APPENDECTOMY  1964   COLONOSCOPY     CYSTOSTOMY W/ BLADDER BIOPSY     Dr. Nolon Baxter   HEMORRHOID SURGERY     Dr. Laraine Plate   NECK EXPLORATION  04/17/2017   with parathyroidectomy   PARATHYROIDECTOMY Right 01/02/2017   Procedure: RIGHT INFERIOR PARATHYROIDECTOMY;  Surgeon: Oralee Billow, MD;  Location: [facility] OR;  Service: General;  Laterality: Right;   PARATHYROIDECTOMY N/A 04/17/2017    Procedure: NECK EXPLORATION WITH PARATHYROIDECTOMY;  Surgeon: Oralee Billow, MD;  Location: [facility] OR;  Service: General;  Laterality: N/A;   SKIN BIOPSY Right 02/07/2022   nodulocystic fat nerosis   SKIN BIOPSY Right 07/04/2022   seborrheic keratosis and actinic keratosis   THUMB FUSION  1997   Dr Mildred All   TONSILLECTOMY AND ADENOIDECTOMY     tummy tuck     Family History  Problem Relation Age of Onset   Cancer Father 6       Lung cancer   Heart disease Father    Hyperlipidemia Father    Heart disease Mother 33       Sudden death presumed MI   Heart disease Brother 36       Presumed CHF   Heart disease Brother    Social History   Socioeconomic History   Marital status: Married    Spouse name: Not on file   Number of children: 2   Years of education: Not on file   Highest education level: Not on file  Occupational History   Occupation: retired  Tobacco Use   Smoking status: Former    Current packs/day: 0.00    Average packs/day: 0.3 packs/day for 10.0 years (2.5 ttl pk-yrs)    Types: Cigarettes    Start date: 06/05/1975    Quit date: 06/04/1985    Years since quitting: 38.7   Smokeless tobacco: Never  Vaping Use   Vaping status: Never Used  Substance and Sexual Activity   Alcohol use: Yes    Alcohol/week: 5.0 standard drinks of alcohol    Types: 5 Glasses of wine per week   Drug use: No   Sexual activity: Yes  Other Topics Concern   Not on file  Social History Narrative   Not on file   Social Drivers of Health   Financial Resource Strain: Low Risk  (02/13/2024)   Overall Financial Resource Strain (CARDIA)    Difficulty of Paying Living Expenses: Not hard at all  Food Insecurity: No Food Insecurity (02/13/2024)   Hunger Vital Sign    Worried About Running Out of Food in the Last Year: Never true    Ran Out of Food in the Last Year: Never true  Transportation Needs: No Transportation Needs (02/13/2024)   PRAPARE - Administrator, Civil Service (Medical): No     Lack of Transportation (Non-Medical): No  Physical Activity: Sufficiently Active (02/13/2024)   Exercise Vital Sign    Days of Exercise per Week: 6 days    Minutes of Exercise per Session: 30 min  Stress: No Stress Concern Present (02/13/2024)   [employer] of Occupational Health - Occupational Stress Questionnaire    Feeling of Stress : Not at all  Social Connections: Moderately Isolated (02/13/2024)   Social Connection and Isolation Panel [NHANES]    Frequency of Communication with Friends and Family: More than  three times a week    Frequency of Social Gatherings with Friends and Family: More than three times a week    Attends Religious Services: Never    Database administrator or Organizations: No    Attends Engineer, structural: Never    Marital Status: Married    Tobacco Counseling Counseling given: Not Answered    Clinical Intake:  Pre-visit preparation completed: Yes  Pain : No/denies pain     Nutritional Risks: None Diabetes: No  No results found for: "HGBA1C"   How often do you need to have someone help you when you read instructions, pamphlets, or other written materials from your doctor or pharmacy?: 1 - Never  Interpreter Needed?: No  Information entered by :: NAllen LPN   Activities of Daily Living     02/13/2024    3:26 PM  In your present state of health, do you have any difficulty performing the following activities:  Hearing? 0  Vision? 0  Difficulty concentrating or making decisions? 0  Walking or climbing stairs? 0  Dressing or bathing? 0  Doing errands, shopping? 0  Preparing Food and eating ? N  Using the Toilet? N  In the past six months, have you accidently leaked urine? N  Do you have problems with loss of bowel control? N  Managing your Medications? N  Managing your Finances? N  Housekeeping or managing your Housekeeping? N    Patient Care Team: Watson Hacking, MD as PCP - General (Family Medicine)  Indicate any  recent Medical Services you may have received from other than [facility] providers in the past year (date may be approximate).     Assessment:   This is a routine wellness examination for Lelania.  Hearing/Vision screen Hearing Screening - Comments:: Denies hearing issues Vision Screening - Comments:: Regular eye exams, [facility]   Goals Addressed             This Visit's Progress    Patient Stated       02/13/2024, wants to lose 2 pounds       Depression Screen     02/13/2024    3:33 PM 02/07/2023    3:33 PM 01/26/2022    8:56 AM 01/15/2021   10:35 AM 11/26/2020    9:48 AM 03/30/2020    8:53 AM 08/13/2019    1:46 PM  PHQ 2/9 Scores  PHQ - 2 Score 0 0 0 0 0 0 0  PHQ- 9 Score 0 0         Fall Risk     02/13/2024    3:32 PM 02/07/2023    3:33 PM 01/26/2022    8:56 AM 01/15/2021   10:35 AM 11/26/2020    9:47 AM  Fall Risk   Falls in the past year? 0 0 0 0 0  Number falls in past yr: 0 0 0 0 0  Injury with Fall? 0 0 0 0 0  Risk for fall due to : Medication side effect Medication side effect No Fall Risks No Fall Risks No Fall Risks  Follow up Falls prevention discussed;Falls evaluation completed Falls prevention discussed;Education provided;Falls evaluation completed Falls evaluation completed Falls evaluation completed Falls evaluation completed    MEDICARE RISK AT HOME:  Medicare Risk at Home Any stairs in or around the home?: Yes If so, are there any without handrails?: No Home free of loose throw rugs in walkways, pet beds, electrical cords, etc?: Yes Adequate lighting in your home to  reduce risk of falls?: Yes Life alert?: No Use of a cane, walker or w/c?: No Grab bars in the bathroom?: Yes Shower chair or bench in shower?: Yes Elevated toilet seat or a handicapped toilet?: Yes  TIMED UP AND GO:  Was the test performed?  No  Cognitive Function: 6CIT completed        02/13/2024    3:34 PM 02/07/2023    3:34 PM 01/26/2022    8:58 AM  6CIT Screen  What Year? 0 points  0 points 0 points  What month? 0 points 0 points 0 points  What time? 0 points 0 points 0 points  Count back from 20 0 points 0 points 0 points  Months in reverse 0 points 0 points 0 points  Repeat phrase 0 points 0 points 0 points  Total Score 0 points 0 points 0 points    Immunizations Immunization History  Administered Date(s) Administered   DTaP 04/17/1998   Fluad Quad(high Dose 65+) 06/06/2019   Influenza Split 10/06/2011, 07/21/2015   Influenza, High Dose Seasonal PF 07/01/2020   Influenza,inj,Quad PF,6+ Mos 06/05/2017, 06/26/2018   Influenza-Unspecified 06/27/2021, 06/10/2022   PFIZER(Purple Top)SARS-COV-2 Vaccination 10/30/2019, 11/18/2019, 07/04/2020   PPD Test 07/21/1982   Pfizer(Comirnaty)Fall Seasonal Vaccine 12 years and older 07/04/2022   Pneumococcal Conjugate-13 06/20/2019, 06/26/2020   Td 12/08/2019   Tdap 06/30/2009   Zoster Recombinant(Shingrix) 01/08/2020   Zoster, Live 06/04/2012    Screening Tests Health Maintenance  Topic Date Due   Zoster Vaccines- Shingrix (2 of 2) 03/04/2020   MAMMOGRAM  03/19/2022   COVID-19 Vaccine (5 - 2024-25 season) 06/11/2023   Pneumonia Vaccine 68+ Years old (2 of 2 - PPSV23) 01/26/2025 (Originally 08/21/2020)   INFLUENZA VACCINE  05/10/2024   Fecal DNA (Cologuard)  02/08/2025   Medicare Annual Wellness (AWV)  02/12/2025   DTaP/Tdap/Td (4 - Td or Tdap) 12/07/2029   DEXA SCAN  Completed   Hepatitis C Screening  Completed   HPV VACCINES  Aged Out   Meningococcal B Vaccine  Aged Out    Health Maintenance  Health Maintenance Due  Topic Date Due   Zoster Vaccines- Shingrix (2 of 2) 03/04/2020   MAMMOGRAM  03/19/2022   COVID-19 Vaccine (5 - 2024-25 season) 06/11/2023   Health Maintenance Items Addressed: Will follow up with [facility] for second shingles date  Additional Screening:  Vision Screening: Recommended annual ophthalmology exams for early detection of glaucoma and other disorders of the eye.  Dental  Screening: Recommended annual dental exams for proper oral hygiene  Community Resource Referral / Chronic Care Management: CRR required this visit?  No   CCM required this visit?  No     Plan:     I have personally reviewed and noted the following in the patient's chart:   Medical and social history Use of alcohol, tobacco or illicit drugs  Current medications and supplements including opioid prescriptions. Patient is not currently taking opioid prescriptions. Functional ability and status Nutritional status Physical activity Advanced directives List of other physicians Hospitalizations, surgeries, and ER visits in previous 12 months Vitals Screenings to include cognitive, depression, and falls Referrals and appointments  In addition, I have reviewed and discussed with patient certain preventive protocols, quality metrics, and best practice recommendations. A written personalized care plan for preventive services as well as general preventive health recommendations were provided to patient.     Areatha Beecham, LPN   10/15/1094   After Visit Summary: (MyChart) Due to this being a telephonic  visit, the after visit summary with patients personalized plan was offered to patient via MyChart   Notes: Nothing significant to report at this time.

## 2024-03-04 ENCOUNTER — Encounter: Payer: Self-pay | Admitting: Family Medicine

## 2024-03-07 ENCOUNTER — Ambulatory Visit: Admitting: Family Medicine

## 2024-03-27 ENCOUNTER — Other Ambulatory Visit

## 2024-03-28 ENCOUNTER — Other Ambulatory Visit: Payer: Self-pay | Admitting: Internal Medicine

## 2024-03-28 ENCOUNTER — Ambulatory Visit: Payer: Self-pay | Admitting: Internal Medicine

## 2024-03-28 LAB — TSH: TSH: 1.79 m[IU]/L (ref 0.40–4.50)

## 2024-03-28 LAB — VITAMIN D 25 HYDROXY (VIT D DEFICIENCY, FRACTURES): Vit D, 25-Hydroxy: 45 ng/mL (ref 30–100)

## 2024-03-28 LAB — T4, FREE: Free T4: 1.6 ng/dL (ref 0.8–1.8)

## 2024-05-29 ENCOUNTER — Encounter: Payer: Self-pay | Admitting: Family Medicine

## 2024-05-29 ENCOUNTER — Ambulatory Visit (INDEPENDENT_AMBULATORY_CARE_PROVIDER_SITE_OTHER): Payer: Medicare Other | Admitting: Family Medicine

## 2024-05-29 VITALS — BP 126/82 | HR 56 | Wt 130.4 lb

## 2024-05-29 DIAGNOSIS — E7800 Pure hypercholesterolemia, unspecified: Secondary | ICD-10-CM | POA: Diagnosis not present

## 2024-05-29 DIAGNOSIS — E039 Hypothyroidism, unspecified: Secondary | ICD-10-CM

## 2024-05-29 LAB — LIPID PANEL

## 2024-05-29 NOTE — Progress Notes (Signed)
   Name: Shannon [name]   Date of Visit: 05/29/24   Date of last visit with me: Visit date not found   CHIEF COMPLAINT:  Chief Complaint  Patient presents with   Annual Exam    Med check plus.        HPI:  Discussed the use of AI scribe software for clinical note transcription with the patient, who gave verbal consent to proceed.  History of Present Illness   Shannon [name] is a 73 year old female who presents for a routine wellness visit.  She is in good health with no current health issues and maintains a healthy lifestyle, emphasizing a balanced diet to avoid medication use. She ensures regular screenings and vaccinations, having received both doses of the shingles vaccine and regularly getting the flu vaccine. She has not yet received the pneumonia vaccine.  She has a history of shoulder issues and has undergone approximately eight PRP injections in the past, which she finds beneficial. She avoids steroid injections unless necessary.  She has a history of a labrum tear from her tennis playing days, which healed naturally without surgery. She has not had PRP treatment for this issue as it was not available at the time of her injury.  She experienced significant piriformis syndrome in the past, initially misdiagnosed as a hamstring issue, causing severe sciatica and impacting her ability to walk for eight months. PRP injections successfully resolved her symptoms within two weeks.  She is a former Armed forces operational officer and now walks for thirty minutes regularly to maintain her health. Her son, Ole, has high iron levels and a family history of hemochromatosis from his father.         OBJECTIVE:       02/13/2024    3:33 PM  Depression screen PHQ 2/9  Decreased Interest 0  Down, Depressed, Hopeless 0  PHQ - 2 Score 0  Altered sleeping 0  Tired, decreased energy 0  Change in appetite 0  Feeling bad or failure about yourself  0  Trouble concentrating 0  Moving slowly or  fidgety/restless 0  Suicidal thoughts 0  PHQ-9 Score 0  Difficult doing work/chores Not difficult at all     BP Readings from Last 3 Encounters:  05/29/24 126/82  01/31/24 116/82  01/29/24 118/76    BP 126/82   Pulse (!) 56   Wt 130 lb 6.4 oz (59.1 kg)   SpO2 97%   BMI 21.70 kg/m    Physical Exam          Physical Exam Constitutional:      Appearance: Normal appearance.  Neurological:     General: No focal deficit present.     Mental Status: She is alert and oriented to person, place, and time. Mental status is at baseline.      ASSESSMENT/PLAN:   Assessment & Plan Hypercholesteremia  Hypothyroidism, unspecified type    Assessment and Plan    HLD 73 year old female with stable thyroid  function and well-managed shoulder condition. Prefers non-pharmacological treatments. No new health concerns. - Repeat CBC, anemia labs, electrolytes, and cholesterol levels. - Administer flu vaccine after September 2nd. - Document receipt of both shingles vaccines. - No PCV21 pneumonia vaccine needed due to sufficient previous vaccinations.      Hypothyroidism    Charitie Hinote A. Vita MD [facility]

## 2024-05-30 LAB — COMPREHENSIVE METABOLIC PANEL WITH GFR
ALT: 17 IU/L (ref 0–32)
AST: 20 IU/L (ref 0–40)
Albumin: 4.5 g/dL (ref 3.8–4.8)
Alkaline Phosphatase: 64 IU/L (ref 44–121)
BUN/Creatinine Ratio: 10 — AB (ref 12–28)
BUN: 7 mg/dL — AB (ref 8–27)
Bilirubin Total: 0.7 mg/dL (ref 0.0–1.2)
CO2: 20 mmol/L (ref 20–29)
Calcium: 9.5 mg/dL (ref 8.7–10.3)
Chloride: 96 mmol/L (ref 96–106)
Creatinine, Ser: 0.67 mg/dL (ref 0.57–1.00)
Globulin, Total: 2.5 g/dL (ref 1.5–4.5)
Glucose: 95 mg/dL (ref 70–99)
Potassium: 4.9 mmol/L (ref 3.5–5.2)
Sodium: 133 mmol/L — ABNORMAL LOW (ref 134–144)
Total Protein: 7 g/dL (ref 6.0–8.5)
eGFR: 93 mL/min/1.73 (ref >59)

## 2024-05-30 LAB — CBC WITH DIFFERENTIAL/PLATELET
Basophils Absolute: 0.1 x10E3/uL (ref 0.0–0.2)
Basos: 1 %
EOS (ABSOLUTE): 0.1 x10E3/uL (ref 0.0–0.4)
Eos: 2 %
Hematocrit: 41.6 % (ref 34.0–46.6)
Hemoglobin: 13.9 g/dL (ref 11.1–15.9)
Immature Grans (Abs): 0 x10E3/uL (ref 0.0–0.1)
Immature Granulocytes: 0 %
Lymphocytes Absolute: 1.4 x10E3/uL (ref 0.7–3.1)
Lymphs: 32 %
MCH: 34.3 pg — ABNORMAL HIGH (ref 26.6–33.0)
MCHC: 33.4 g/dL (ref 31.5–35.7)
MCV: 103 fL — ABNORMAL HIGH (ref 79–97)
Monocytes Absolute: 0.4 x10E3/uL (ref 0.1–0.9)
Monocytes: 9 %
Neutrophils Absolute: 2.4 x10E3/uL (ref 1.4–7.0)
Neutrophils: 56 %
Platelets: 295 x10E3/uL (ref 150–450)
RBC: 4.05 x10E6/uL (ref 3.77–5.28)
RDW: 12 % (ref 11.7–15.4)
WBC: 4.4 x10E3/uL (ref 3.4–10.8)

## 2024-05-30 LAB — LIPID PANEL
Cholesterol, Total: 227 mg/dL — AB (ref 100–199)
HDL: 89 mg/dL (ref >39)
LDL CALC COMMENT:: 2.6 ratio (ref 0.0–4.4)
LDL Chol Calc (NIH): 123 mg/dL — AB (ref 0–99)
Triglycerides: 88 mg/dL (ref 0–149)
VLDL Cholesterol Cal: 15 mg/dL (ref 5–40)

## 2024-06-03 ENCOUNTER — Ambulatory Visit: Payer: Self-pay | Admitting: Family Medicine

## 2024-06-11 ENCOUNTER — Encounter: Payer: Self-pay | Admitting: Sports Medicine

## 2024-06-18 ENCOUNTER — Telehealth: Payer: Self-pay

## 2024-06-18 ENCOUNTER — Other Ambulatory Visit: Payer: Self-pay

## 2024-06-18 DIAGNOSIS — Z23 Encounter for immunization: Secondary | ICD-10-CM

## 2024-06-18 MED ORDER — COVID-19 MRNA VAC-TRIS(PFIZER) 30 MCG/0.3ML IM SUSY: 0.3000 mL | PREFILLED_SYRINGE | Freq: Once | INTRAMUSCULAR | 0 refills | Status: AC

## 2024-06-18 NOTE — Telephone Encounter (Signed)
 Copied from CRM #[id number]. Topic: Clinical - Medication Question >> Jun 18, 2024  9:31 AM Leonette SQUIBB wrote: Reason for CRM: pt called asking if a prescription for the covid vaccine could be sent to [facility] on [address].  CB#  [phone]  I sent over a prescription electronically to [facility].

## 2024-09-21 ENCOUNTER — Other Ambulatory Visit: Payer: Self-pay | Admitting: Family Medicine

## 2024-09-21 DIAGNOSIS — J4520 Mild intermittent asthma, uncomplicated: Secondary | ICD-10-CM

## 2025-01-29 ENCOUNTER — Ambulatory Visit: Admitting: Internal Medicine

## 2025-02-18 ENCOUNTER — Ambulatory Visit: Payer: Self-pay

## 2025-06-03 ENCOUNTER — Ambulatory Visit: Payer: Self-pay | Admitting: Family Medicine
